# Patient Record
Sex: Female | Born: 1935
Health system: Southern US, Community
[De-identification: ages and names within clinical notes are randomized; demographics above are authoritative.]

## PROBLEM LIST (undated history)

## (undated) DIAGNOSIS — H3530 Unspecified macular degeneration: Secondary | ICD-10-CM

## (undated) DIAGNOSIS — M109 Gout, unspecified: Secondary | ICD-10-CM

## (undated) DIAGNOSIS — N3941 Urge incontinence: Secondary | ICD-10-CM

## (undated) DIAGNOSIS — R0789 Other chest pain: Secondary | ICD-10-CM

## (undated) DIAGNOSIS — I10 Essential (primary) hypertension: Secondary | ICD-10-CM

## (undated) DIAGNOSIS — F17200 Nicotine dependence, unspecified, uncomplicated: Secondary | ICD-10-CM

## (undated) DIAGNOSIS — N183 Chronic kidney disease, stage 3 unspecified: Secondary | ICD-10-CM

## (undated) DIAGNOSIS — I495 Sick sinus syndrome: Secondary | ICD-10-CM

## (undated) DIAGNOSIS — E781 Pure hyperglyceridemia: Secondary | ICD-10-CM

## (undated) DIAGNOSIS — Z950 Presence of cardiac pacemaker: Secondary | ICD-10-CM

## (undated) DIAGNOSIS — K5792 Diverticulitis of intestine, part unspecified, without perforation or abscess without bleeding: Secondary | ICD-10-CM

## (undated) HISTORY — DX: Gout, unspecified: M10.9

## (undated) HISTORY — DX: Nicotine dependence, unspecified, uncomplicated: F17.200

## (undated) HISTORY — DX: Pure hyperglyceridemia: E78.1

## (undated) HISTORY — DX: Unspecified macular degeneration: H35.30

## (undated) HISTORY — DX: Diverticulitis of intestine, part unspecified, without perforation or abscess without bleeding: K57.92

## (undated) HISTORY — DX: Presence of cardiac pacemaker: Z95.0

## (undated) HISTORY — DX: Chronic kidney disease, stage 3 unspecified: N18.30

## (undated) HISTORY — PX: PACEMAKER INSERTION: SHX728

## (undated) HISTORY — DX: Chronic kidney disease, stage 3 (moderate): N18.3

## (undated) HISTORY — DX: Other chest pain: R07.89

## (undated) HISTORY — DX: Urge incontinence: N39.41

## (undated) HISTORY — DX: Sick sinus syndrome: I49.5

## (undated) HISTORY — DX: Essential (primary) hypertension: I10

---
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
## 1958-05-14 HISTORY — PX: TUBAL LIGATION: SHX77

## 1976-05-14 HISTORY — PX: ABDOMINAL HYSTERECTOMY: SHX81

## 1992-05-14 DIAGNOSIS — Z950 Presence of cardiac pacemaker: Secondary | ICD-10-CM

## 1992-05-14 HISTORY — PX: PACEMAKER INSERTION: SHX728

## 1992-05-14 HISTORY — DX: Presence of cardiac pacemaker: Z95.0

## 1996-05-14 HISTORY — PX: CHOLECYSTECTOMY: SHX55

## 1997-09-08 ENCOUNTER — Ambulatory Visit (HOSPITAL_COMMUNITY): Admission: RE | Admit: 1997-09-08 | Discharge: 1997-09-08 | Payer: Self-pay | Admitting: Internal Medicine

## 1998-05-14 HISTORY — PX: CARDIAC CATHETERIZATION: SHX172

## 1998-09-12 ENCOUNTER — Ambulatory Visit (HOSPITAL_COMMUNITY): Admission: RE | Admit: 1998-09-12 | Discharge: 1998-09-12 | Payer: Self-pay | Admitting: Internal Medicine

## 1998-09-12 ENCOUNTER — Encounter: Payer: Self-pay | Admitting: Internal Medicine

## 1998-11-11 ENCOUNTER — Other Ambulatory Visit: Admission: RE | Admit: 1998-11-11 | Discharge: 1998-11-11 | Payer: Self-pay | Admitting: Obstetrics and Gynecology

## 1998-12-30 ENCOUNTER — Ambulatory Visit (HOSPITAL_COMMUNITY): Admission: RE | Admit: 1998-12-30 | Discharge: 1998-12-30 | Payer: Self-pay | Admitting: Cardiology

## 1999-02-14 ENCOUNTER — Encounter: Payer: Self-pay | Admitting: Cardiovascular Disease

## 1999-02-15 ENCOUNTER — Inpatient Hospital Stay (HOSPITAL_COMMUNITY): Admission: RE | Admit: 1999-02-15 | Discharge: 1999-02-16 | Payer: Self-pay | Admitting: Cardiovascular Disease

## 1999-02-15 ENCOUNTER — Encounter: Payer: Self-pay | Admitting: Cardiovascular Disease

## 1999-03-18 ENCOUNTER — Ambulatory Visit (HOSPITAL_COMMUNITY): Admission: RE | Admit: 1999-03-18 | Discharge: 1999-03-18 | Payer: Self-pay | Admitting: Critical Care Medicine

## 1999-05-15 LAB — HM PAP SMEAR: HM Pap smear: NORMAL

## 2000-05-14 HISTORY — PX: CYSTOSCOPY: SHX5120

## 2000-06-11 ENCOUNTER — Other Ambulatory Visit: Admission: RE | Admit: 2000-06-11 | Discharge: 2000-06-11 | Payer: Self-pay | Admitting: Internal Medicine

## 2000-06-12 ENCOUNTER — Encounter: Payer: Self-pay | Admitting: Internal Medicine

## 2000-06-12 ENCOUNTER — Ambulatory Visit (HOSPITAL_COMMUNITY): Admission: RE | Admit: 2000-06-12 | Discharge: 2000-06-12 | Payer: Self-pay | Admitting: Internal Medicine

## 2001-05-14 DIAGNOSIS — R0789 Other chest pain: Secondary | ICD-10-CM

## 2001-05-14 HISTORY — DX: Other chest pain: R07.89

## 2001-06-23 ENCOUNTER — Ambulatory Visit (HOSPITAL_COMMUNITY): Admission: RE | Admit: 2001-06-23 | Discharge: 2001-06-23 | Payer: Self-pay | Admitting: Internal Medicine

## 2001-06-23 ENCOUNTER — Encounter: Payer: Self-pay | Admitting: Internal Medicine

## 2001-08-15 ENCOUNTER — Encounter: Admission: RE | Admit: 2001-08-15 | Discharge: 2001-08-15 | Payer: Self-pay | Admitting: Internal Medicine

## 2001-08-15 ENCOUNTER — Encounter: Payer: Self-pay | Admitting: Internal Medicine

## 2001-11-21 ENCOUNTER — Other Ambulatory Visit: Admission: RE | Admit: 2001-11-21 | Discharge: 2001-11-21 | Payer: Self-pay | Admitting: Internal Medicine

## 2002-08-20 ENCOUNTER — Encounter: Payer: Self-pay | Admitting: Internal Medicine

## 2002-08-20 ENCOUNTER — Ambulatory Visit (HOSPITAL_COMMUNITY): Admission: RE | Admit: 2002-08-20 | Discharge: 2002-08-20 | Payer: Self-pay | Admitting: Internal Medicine

## 2003-08-23 ENCOUNTER — Ambulatory Visit (HOSPITAL_COMMUNITY): Admission: RE | Admit: 2003-08-23 | Discharge: 2003-08-23 | Payer: Self-pay | Admitting: Internal Medicine

## 2004-08-23 ENCOUNTER — Ambulatory Visit (HOSPITAL_COMMUNITY): Admission: RE | Admit: 2004-08-23 | Discharge: 2004-08-23 | Payer: Self-pay | Admitting: Internal Medicine

## 2004-11-11 HISTORY — PX: EYE SURGERY: SHX253

## 2005-08-27 ENCOUNTER — Ambulatory Visit (HOSPITAL_COMMUNITY): Admission: RE | Admit: 2005-08-27 | Discharge: 2005-08-27 | Payer: Self-pay | Admitting: Internal Medicine

## 2006-02-19 ENCOUNTER — Encounter: Admission: RE | Admit: 2006-02-19 | Discharge: 2006-02-19 | Payer: Self-pay | Admitting: *Deleted

## 2006-02-25 ENCOUNTER — Ambulatory Visit (HOSPITAL_COMMUNITY): Admission: RE | Admit: 2006-02-25 | Discharge: 2006-02-25 | Payer: Self-pay | Admitting: *Deleted

## 2006-08-30 ENCOUNTER — Ambulatory Visit (HOSPITAL_COMMUNITY): Admission: RE | Admit: 2006-08-30 | Discharge: 2006-08-30 | Payer: Self-pay | Admitting: *Deleted

## 2007-09-02 ENCOUNTER — Ambulatory Visit (HOSPITAL_COMMUNITY): Admission: RE | Admit: 2007-09-02 | Discharge: 2007-09-02 | Payer: Self-pay | Admitting: *Deleted

## 2007-10-23 ENCOUNTER — Encounter: Admission: RE | Admit: 2007-10-23 | Discharge: 2007-10-23 | Payer: Self-pay | Admitting: Family Medicine

## 2008-01-15 ENCOUNTER — Encounter: Admission: RE | Admit: 2008-01-15 | Discharge: 2008-01-15 | Payer: Self-pay | Admitting: Gastroenterology

## 2008-09-02 ENCOUNTER — Ambulatory Visit (HOSPITAL_COMMUNITY): Admission: RE | Admit: 2008-09-02 | Discharge: 2008-09-02 | Payer: Self-pay | Admitting: Family Medicine

## 2009-05-14 LAB — HM PAP SMEAR: HM Pap smear: NORMAL

## 2009-09-05 ENCOUNTER — Ambulatory Visit (HOSPITAL_COMMUNITY): Admission: RE | Admit: 2009-09-05 | Discharge: 2009-09-05 | Payer: Self-pay | Admitting: Family Medicine

## 2009-10-19 ENCOUNTER — Encounter: Admission: RE | Admit: 2009-10-19 | Discharge: 2009-10-19 | Payer: Self-pay | Admitting: Surgery

## 2009-11-11 HISTORY — PX: COLON SURGERY: SHX602

## 2009-11-24 ENCOUNTER — Inpatient Hospital Stay (HOSPITAL_COMMUNITY): Admission: RE | Admit: 2009-11-24 | Discharge: 2009-12-02 | Payer: Self-pay | Admitting: Surgery

## 2009-11-24 ENCOUNTER — Encounter (INDEPENDENT_AMBULATORY_CARE_PROVIDER_SITE_OTHER): Payer: Self-pay | Admitting: Surgery

## 2009-12-12 ENCOUNTER — Inpatient Hospital Stay (HOSPITAL_COMMUNITY): Admission: AD | Admit: 2009-12-12 | Discharge: 2009-12-18 | Payer: Self-pay | Admitting: Surgery

## 2010-06-04 ENCOUNTER — Encounter: Payer: Self-pay | Admitting: Family Medicine

## 2010-07-28 LAB — BASIC METABOLIC PANEL
BUN: 35 mg/dL — ABNORMAL HIGH (ref 6–23)
CO2: 23 mEq/L (ref 19–32)
CO2: 24 mEq/L (ref 19–32)
Calcium: 9 mg/dL (ref 8.4–10.5)
Calcium: 9.4 mg/dL (ref 8.4–10.5)
Creatinine, Ser: 0.95 mg/dL (ref 0.4–1.2)
Creatinine, Ser: 1.72 mg/dL — ABNORMAL HIGH (ref 0.4–1.2)
GFR calc Af Amer: >60 mL/min (ref >60)
GFR calc Af Amer: >60 mL/min (ref >60)
GFR calc non Af Amer: 29 mL/min — ABNORMAL LOW (ref >60)
Glucose, Bld: 122 mg/dL — ABNORMAL HIGH (ref 70–99)
Glucose, Bld: 149 mg/dL — ABNORMAL HIGH (ref 70–99)
Potassium: 4.4 mEq/L (ref 3.5–5.1)
Potassium: 4.5 mEq/L (ref 3.5–5.1)
Sodium: 139 mEq/L (ref 135–145)

## 2010-07-28 LAB — URINALYSIS, ROUTINE W REFLEX MICROSCOPIC
Glucose, UA: NEGATIVE mg/dL
Ketones, ur: NEGATIVE mg/dL
Nitrite: NEGATIVE
Protein, ur: NEGATIVE mg/dL

## 2010-07-28 LAB — CBC
HCT: 31.6 % — ABNORMAL LOW (ref 36.0–46.0)
MCHC: 33.6 g/dL (ref 30.0–36.0)
MCV: 93.9 fL (ref 78.0–100.0)
Platelets: 330 10*3/uL (ref 150–400)
RBC: 3.44 MIL/uL — ABNORMAL LOW (ref 3.87–5.11)
RDW: 13.7 % (ref 11.5–15.5)
RDW: 13.9 % (ref 11.5–15.5)
WBC: 13.2 10*3/uL — ABNORMAL HIGH (ref 4.0–10.5)

## 2010-07-28 LAB — URINE MICROSCOPIC-ADD ON

## 2010-07-28 LAB — STOOL CULTURE

## 2010-07-28 LAB — CLOSTRIDIUM DIFFICILE EIA

## 2010-07-29 LAB — BASIC METABOLIC PANEL
CO2: 26 mEq/L (ref 19–32)
Calcium: 8.6 mg/dL (ref 8.4–10.5)
Calcium: 8.6 mg/dL (ref 8.4–10.5)
Chloride: 105 mEq/L (ref 96–112)
GFR calc Af Amer: >60 mL/min (ref >60)
GFR calc Af Amer: >60 mL/min (ref >60)
GFR calc non Af Amer: 41 mL/min — ABNORMAL LOW (ref >60)
GFR calc non Af Amer: >60 mL/min (ref >60)
Glucose, Bld: 108 mg/dL — ABNORMAL HIGH (ref 70–99)
Glucose, Bld: 138 mg/dL — ABNORMAL HIGH (ref 70–99)
Potassium: 4.1 mEq/L (ref 3.5–5.1)
Potassium: 4.3 mEq/L (ref 3.5–5.1)
Potassium: 4.8 mEq/L (ref 3.5–5.1)
Sodium: 137 mEq/L (ref 135–145)
Sodium: 138 mEq/L (ref 135–145)
Sodium: 141 mEq/L (ref 135–145)

## 2010-07-29 LAB — CBC
HCT: 30.9 % — ABNORMAL LOW (ref 36.0–46.0)
HCT: 33.2 % — ABNORMAL LOW (ref 36.0–46.0)
HCT: 34.5 % — ABNORMAL LOW (ref 36.0–46.0)
Hemoglobin: 10.4 g/dL — ABNORMAL LOW (ref 12.0–15.0)
Hemoglobin: 11.3 g/dL — ABNORMAL LOW (ref 12.0–15.0)
Hemoglobin: 11.7 g/dL — ABNORMAL LOW (ref 12.0–15.0)
MCH: 31.6 pg (ref 26.0–34.0)
MCHC: 33.8 g/dL (ref 30.0–36.0)
MCV: 93.1 fL (ref 78.0–100.0)
RBC: 3.27 MIL/uL — ABNORMAL LOW (ref 3.87–5.11)
RBC: 3.56 MIL/uL — ABNORMAL LOW (ref 3.87–5.11)
RDW: 13.9 % (ref 11.5–15.5)
WBC: 16.1 10*3/uL — ABNORMAL HIGH (ref 4.0–10.5)

## 2010-07-30 LAB — URINALYSIS, ROUTINE W REFLEX MICROSCOPIC
Bilirubin Urine: NEGATIVE
Glucose, UA: NEGATIVE mg/dL
Ketones, ur: NEGATIVE mg/dL
pH: 6.5 (ref 5.0–8.0)

## 2010-07-30 LAB — CROSSMATCH
ABO/RH(D): A POS
Antibody Screen: NEGATIVE

## 2010-07-30 LAB — CBC
Hemoglobin: 13.2 g/dL (ref 12.0–15.0)
MCHC: 33.7 g/dL (ref 30.0–36.0)
WBC: 10.2 10*3/uL (ref 4.0–10.5)

## 2010-07-30 LAB — ABO/RH: ABO/RH(D): A POS

## 2010-07-30 LAB — URINE MICROSCOPIC-ADD ON

## 2010-07-30 LAB — COMPREHENSIVE METABOLIC PANEL
ALT: <8 U/L (ref 0–35)
AST: 21 U/L (ref 0–37)
CO2: 26 mEq/L (ref 19–32)
Calcium: 9.9 mg/dL (ref 8.4–10.5)
GFR calc Af Amer: 50 mL/min — ABNORMAL LOW (ref >60)
Sodium: 141 mEq/L (ref 135–145)
Total Protein: 7.3 g/dL (ref 6.0–8.3)

## 2010-07-30 LAB — DIFFERENTIAL
Eosinophils Absolute: 0.2 10*3/uL (ref 0.0–0.7)
Eosinophils Relative: 2 % (ref 0–5)
Lymphs Abs: 2.9 10*3/uL (ref 0.7–4.0)
Monocytes Relative: 7 % (ref 3–12)

## 2010-08-09 ENCOUNTER — Ambulatory Visit
Admission: RE | Admit: 2010-08-09 | Discharge: 2010-08-09 | Disposition: A | Discharge status: Home / Self Care | Payer: Medicare Other | Source: Ambulatory Visit | Attending: Family Medicine | Admitting: Family Medicine

## 2010-08-09 ENCOUNTER — Other Ambulatory Visit: Payer: Self-pay | Admitting: Family Medicine

## 2010-08-09 DIAGNOSIS — R1032 Left lower quadrant pain: Secondary | ICD-10-CM

## 2010-08-09 MED ORDER — IOHEXOL 300 MG/ML  SOLN
100.0000 mL | Freq: Once | INTRAMUSCULAR | Status: AC | PRN
  Administered 2010-08-09: 100 mL via INTRAVENOUS

## 2010-08-21 ENCOUNTER — Other Ambulatory Visit (HOSPITAL_COMMUNITY): Payer: Self-pay | Admitting: Family Medicine

## 2010-08-21 DIAGNOSIS — Z1231 Encounter for screening mammogram for malignant neoplasm of breast: Secondary | ICD-10-CM

## 2010-09-12 ENCOUNTER — Ambulatory Visit (HOSPITAL_COMMUNITY)
Admission: RE | Admit: 2010-09-12 | Discharge: 2010-09-12 | Disposition: A | Discharge status: Home / Self Care | Payer: Medicare Other | Source: Ambulatory Visit | Attending: Family Medicine | Admitting: Family Medicine

## 2010-09-12 DIAGNOSIS — Z1231 Encounter for screening mammogram for malignant neoplasm of breast: Secondary | ICD-10-CM

## 2010-09-29 NOTE — Op Note (Signed)
[name], [name] NO.:  [id number]   MEDICAL RECORD NO.:  [id number]          PATIENT TYPE:  OIB   LOCATION:  2899                         FACILITY:  [facility]   PHYSICIAN:  Darlin Priestly, MD  DATE OF BIRTH:  1927-05-26   DATE OF PROCEDURE:  02/25/2006  DATE OF DISCHARGE:                                 OPERATIVE REPORT   PROCEDURES:  1. Insertion of temporary transvenous pacing wire via right femoral      sheath.  2. Explant of a Guidant Discovery II, model [id number] generator, serial      [id number], initially implanted 02/14/1999.  3. Atrial and ventricular lead interrogation.  4. Pocket modification.  5. Implant of a Medtronic Adapta ADDRL1, serial [id number].   ATTENDING SURGEON:  Darlin Priestly, MD.   COMPLICATIONS:  None.   INDICATIONS:  [name] is a 77 year old female patient of Dr. Madison Hickman, and Dr. Susa Griffins with a history of complete heart block,  status post initial pacer implant in 1994 by Dr. Mila Atkinson, changed out in  2000 by Dr. Alanda Amass, as she reached ERI.  She has recently tripped the ERI  on her 2000 generator.  She is now referred for generator change-out.   DESCRIPTION OF OPERATIVE PROCEDURE:  After the patient gave informed written  consent, the patient was brought to the cardiac cath lab, where the right  groin was shaved, prepped and draped and the right chest was prepped and  draped in sterile fashion.  Lidocaine 1% was then used to anesthetize the  right groin.  Next a #6-French venous sheath was inserted in the right  femoral vein.  Next, under fluoroscopic guidance, a temporary transvenous  pacing wire was then floated into the RV apex and thresholds were  determined.  This was secured to the sheath.  Attention was then turned to  the right chest.  Lidocaine 1% was then anesthetized over the previously  implanted generator.  Next, roughly a 3-cm incision was carried out just  inferior to her previous  implant scar, and hemostasis was obtained with  electrocautery.  Blunt dissection was used to carry this down to the pacer  capsule.  The capsule was then opened with a scalpel.  The generator was  then freed from the capsule and delivered from the pocket.  As previously  stated, this was a [employer] II, model [id number], serial [id number].  The  leads were then inspected.  The atrial lead was a model 433-03, serial  [id number], initially implanted on 12/26/92.  This lead appeared to be in  excellent condition.  The ventricular lead was model 431-07, serial  [id number], implanted 12/26/92.  The leads were then interrogated.  P waves  were measured at 0.9 mV.  The atrial lead impedance was 389 ohms.  Threshold  in the atrium was 0.7 V at 0.5 msec.  Current was 1.9 mA.  R waves were  measured at 2.8 mV.  The impedance was 350 ohms.  Threshold in the ventricle  was 1.1 V at 0.5 msec.  Current was 2.5 mA.  We  did entertain replacing the  unipolar ventricular lead; however, she had internodal AV conduction with  excellent thresholds in the atrium, and we ultimately opted for implant of  an Adapta generator with MVP and surge AV hysteresis to allow internodal  conduction.  We were able to program the generator down to 1 mV for sensing.  The leads were connected in serial fashion to the header, and the head  screws were tightened.  The pocket was then copiously irrigated with 1%  kanamycin solution.  Hemostasis was again confirmed.  The generator and  leads were then delivered into the pocket.  The subcutaneous layer was then  closed with running 2-0 Vicryl.  The skin was closed with running 4-0  Vicryl.  Steri-Strips were then applied.  The right femoral transvenous  pacer was then removed, and the femoral sheath was discontinued.  Pressure  was held.  The patient returned to the recovery room in stable condition.   CONCLUSIONS:  1. Successful implant of a temporary transvenous pacing wire via the  right      femoral vein.  2. Explant of a Guidant Discovery II, model [id number], serial [id number],      initially implanted 02/14/1999.  3. Atrial and ventricular lead interrogation.  4. Pocket modification.  5. Implant of a Medtronic Adapta ADDRL1, serial [id number].      Darlin Priestly, MD  Electronically Signed     RHM/MEDQ  D:  02/25/2006  T:  02/25/2006  Job:  [id number]   cc:   Darius Bump, M.D.  Richard A. Alanda Amass, M.D.

## 2010-09-29 NOTE — H&P (Signed)
NAMELISSETT, FAVORITE NO.:  [id number]   MEDICAL RECORD NO.:  [id number]          PATIENT TYPE:  OIB   LOCATION:  2899                         FACILITY:  [facility]   PHYSICIAN:  Darlin Priestly, MD  DATE OF BIRTH:  1928-02-25   DATE OF ADMISSION:  02/25/2006  DATE OF DISCHARGE:  02/25/2006                              HISTORY & PHYSICAL   CHIEF COMPLAINT:  Tripped ERI on permanent pacemaker.   HISTORY OF PRESENT ILLNESS:  A 77 year old white patient, female patient  of Dr. Madison Hickman, with a history of complete heart block with a  generator implant in 1994 by Dr. Edwyna Shell, changed out in 2000 with Dr.  Alanda Amass, as she reached end of life.  She was seen by Dr. Jenne Campus  02/19/06, and again, generator with ERI.  She had a [employer] II  model number [id number], serial number [id number] implanted February 14, 1999.  She  is dependent but has ventricular escape rhythm in the upper 20s.  Please  see Dr. Mikey Bussing note of 02/19/06.  She was scheduled to come in as an  outpatient for explantation of old device and implantation of new.   PAST MEDICAL HISTORY:  See above.  Negative Myoview in '05.   FAMILY HISTORY/SOCIAL HISTORY/REVIEW OF SYSTEMS:  Without change, please  see her records from the office and copies were sent for this procedure.   VITAL SIGNS ON ADMISSION:  Blood pressure 135/75, pulse 72, respiratory  rate 16, temperature 97.4, oxygen saturation 99%.   EXAM:  GENERAL:  Alert and oriented white female.  LUNGS:  Clear.  HEART:  S1, S2, regular rate and rhythm.  ABDOMEN:  Soft and nontender.  Positive bowel sounds.  EXTREMITIES:  Lower extremities without edema.   ASSESSMENT:  End of life, on Guidant pacemaker, here for explantation of  old device and implantation of new device and to evaluate her leads.   PLAN:  As above.      Darcella Gasman. Valarie Merino      Darlin Priestly, MD  Electronically Signed   LRI/MEDQ  D:  03/27/2006  T:  03/28/2006   Job:  [id number]

## 2011-08-17 ENCOUNTER — Other Ambulatory Visit: Payer: Self-pay | Admitting: Family Medicine

## 2011-08-17 DIAGNOSIS — Z1231 Encounter for screening mammogram for malignant neoplasm of breast: Secondary | ICD-10-CM

## 2011-09-06 ENCOUNTER — Encounter: Payer: Self-pay | Admitting: *Deleted

## 2011-09-13 ENCOUNTER — Ambulatory Visit (HOSPITAL_COMMUNITY): Payer: Medicare Other

## 2011-09-14 ENCOUNTER — Ambulatory Visit (HOSPITAL_COMMUNITY)
Admission: RE | Admit: 2011-09-14 | Discharge: 2011-09-14 | Disposition: A | Discharge status: Home / Self Care | Payer: Medicare Other | Source: Ambulatory Visit | Attending: Family Medicine | Admitting: Family Medicine

## 2011-09-14 DIAGNOSIS — Z1231 Encounter for screening mammogram for malignant neoplasm of breast: Secondary | ICD-10-CM | POA: Insufficient documentation

## 2011-09-20 ENCOUNTER — Encounter: Payer: Self-pay | Admitting: *Deleted

## 2011-10-22 ENCOUNTER — Ambulatory Visit (INDEPENDENT_AMBULATORY_CARE_PROVIDER_SITE_OTHER): Payer: Medicare Other | Admitting: Family Medicine

## 2011-10-22 ENCOUNTER — Encounter: Payer: Self-pay | Admitting: Family Medicine

## 2011-10-22 VITALS — BP 118/76 | HR 64 | Ht 65.0 in | Wt 168.0 lb

## 2011-10-22 DIAGNOSIS — F17200 Nicotine dependence, unspecified, uncomplicated: Secondary | ICD-10-CM

## 2011-10-22 DIAGNOSIS — Z79899 Other long term (current) drug therapy: Secondary | ICD-10-CM

## 2011-10-22 DIAGNOSIS — E781 Pure hyperglyceridemia: Secondary | ICD-10-CM | POA: Insufficient documentation

## 2011-10-22 DIAGNOSIS — N289 Disorder of kidney and ureter, unspecified: Secondary | ICD-10-CM

## 2011-10-22 DIAGNOSIS — Z720 Tobacco use: Secondary | ICD-10-CM | POA: Insufficient documentation

## 2011-10-22 DIAGNOSIS — R7989 Other specified abnormal findings of blood chemistry: Secondary | ICD-10-CM

## 2011-10-22 DIAGNOSIS — I10 Essential (primary) hypertension: Secondary | ICD-10-CM

## 2011-10-22 DIAGNOSIS — N183 Chronic kidney disease, stage 3 unspecified: Secondary | ICD-10-CM | POA: Insufficient documentation

## 2011-10-22 DIAGNOSIS — R799 Abnormal finding of blood chemistry, unspecified: Secondary | ICD-10-CM

## 2011-10-22 LAB — POCT URINALYSIS DIPSTICK
Bilirubin, UA: NEGATIVE
Glucose, UA: NEGATIVE
Spec Grav, UA: 1.01
Urobilinogen, UA: NEGATIVE

## 2011-10-22 LAB — CBC WITH DIFFERENTIAL/PLATELET
HCT: 41.3 % (ref 36.0–46.0)
Hemoglobin: 13.9 g/dL (ref 12.0–15.0)
Lymphocytes Relative: 29 % (ref 12–46)
Monocytes Absolute: 0.6 10*3/uL (ref 0.1–1.0)
Monocytes Relative: 6 % (ref 3–12)
Neutro Abs: 6.6 10*3/uL (ref 1.7–7.7)
WBC: 10.3 10*3/uL (ref 4.0–10.5)

## 2011-10-22 LAB — TSH: TSH: 1.494 u[IU]/mL (ref 0.350–4.500)

## 2011-10-22 NOTE — Progress Notes (Signed)
Chief Complaint  Patient presents with  . Advice Only    former patient requesting kidney evaluation and blood work. Last seen by Claude Manges.  UA showed 1+ blood and 1+ leuks-pt asymptomatic.   HPI:  Patient presents to re-establish care, asking for bloodwork, not feeling confident in what she was told by previous provider. She is concerned about her kidney function.  Review of old chart shows Cr 1.27, GFR 40 in 07/2011.  Cr as high as 1.49 in recent years. She denies any urinary complaints, except occasional slight leakage when very active.  She reports being told she has gout R hand (has intermittent pain/swelling in Right 2nd MCP if she overuses it--she thinks it is arthritis).  She was rx'd uloric, but it was much too expensive so didn't take it.  Doesn't have pain daily. Per previous records, last uric acid level was 6.8 (not on meds)  She has a history of diverticulitis and diverticulosis.  She hasn't had recent problems.  She reports occasional burning LLQ discomfort, depending on what she eats. Denies pain, vomiting, or blood in her stool.  Past Medical History  Diagnosis Date  . Sick sinus syndrome with bradycardia  . Pacemaker     sees Dr. Rubie Maid [facility]) annually. Pacemaker replaced 2007 Jenne Campus)  . Urge incontinence   . Hypertension   . Hypertriglyceridemia   . Chest wall pain 2003    atypical, now resolved  . Diverticulitis     with DT flare in 6/09  . Macular degeneration     Dr. Sharlot Gowda  . CKD (chronic kidney disease), stage III     Cr 1.27, GFR 40 (07/2011)  . Tobacco use disorder     Past Surgical History  Procedure Date  . Pacemaker insertion 1994, 2000, 2007  . Cholecystectomy 1998    Dr. Gerrit Friends  . Appendectomy 1952  . Tubal ligation 1960  . Abdominal hysterectomy 1978    partial  . Eye surgery 11/2004    cataract surgery b/l - Dr.Bergen  . Colon surgery 11/2009    vaginal-colon fistula repair (Drs. Delane Ginger)  . Cardiac catheterization 2000  .  Cystoscopy 2002    Dr. Wanda Plump    History   Social History  . Marital Status: Divorced    Spouse Name: N/A    Number of Children: 4  . Years of Education: N/A   Occupational History  . retired Agricultural engineer    Social History Main Topics  . Smoking status: Current Everyday Smoker -- 0.2 packs/day for 40 years  . Smokeless tobacco: Not on file   Comment: smokes 1 pack/week  . Alcohol Use: No  . Drug Use: No  . Sexually Active: Not on file   Other Topics Concern  . Not on file   Social History Narrative   Lives alone, 1 cat.  [place], Gavin Pound Banker at [employer]) lives in [place].    Family History  Problem Relation Age of Onset  . Diabetes Mother   . Heart disease Mother   . Hyperlipidemia Mother   . Hypertension Mother   . Heart disease Father     pacemaker, CABG  . Hyperlipidemia Father   . Hypertension Father   . Cancer Sister     breast cancer 18's, then recurrence, metastatic, died at 30  . Diabetes Brother   . Alcohol abuse Brother   . Heart disease Brother   . Crohn's disease Daughter   . Alzheimer's disease Brother 54  Current outpatient prescriptions:aspirin 81 MG tablet, Take 81 mg by mouth daily., Disp: , Rfl: ;  bumetanide (BUMEX) 1 MG tablet, Take 1 mg by mouth daily., Disp: , Rfl: ;  calcium carbonate (OS-CAL) 600 MG TABS, Take 600 mg by mouth daily., Disp: , Rfl: ;  fenofibrate 54 MG tablet, Take 54 mg by mouth daily., Disp: , Rfl: ;  metoprolol tartrate (LOPRESSOR) 25 MG tablet, Take 75 mg by mouth daily. 50mg  q am and 25mg  q evening, Disp: , Rfl:  Multiple Vitamins-Minerals (MULTIVITAMIN WITH MINERALS) tablet, Take 1 tablet by mouth daily., Disp: , Rfl: ;  Multiple Vitamins-Minerals (OCUVITE PRESERVISION PO), Take 1 tablet by mouth daily., Disp: , Rfl: ;  polycarbophil (FIBERCON) 625 MG tablet, Take 1,875 mg by mouth daily., Disp: , Rfl:   Allergies  Allergen Reactions  . Morphine And Related Nausea And Vomiting   ROS:  Denies  fevers, headaches, dizziness, URI symptoms, cough, shortness of breath, chest pain, edema, GI complaints. Denies claudication.  Mild stress urinary incontinence.  Hand (2nd MCP) pain intermittently.  Denies skin rashes, depression or other concerns.  PHYSICAL EXAM: BP 118/76  Pulse 64  Ht 5\' 5"  (1.651 m)  Wt 168 lb (76.204 kg)  BMI 27.96 kg/m2 Well developed, pleasant female in no distress HEENT:  PERRL, EOMI, conjunctiva clear.  OP clear. Neck: no lymphadenopathy, thyromegaly or bruit or mass Heart: regular rate and rhythm Lungs: clear bilaterally, somewhat diminished breath sounds throughout. No wheezes, rales, ronchi Abdomen: soft, nontender, active bowel sounds, no mass Weakened pulses bilaterally, cool feet with brisk capillary refill. Mild bony enlargement of index finger MCP--no warmth, effusion or tenderness Skin: no rash Psych: normal mood, affect, hygiene and grooming  ASSESSMENT/PLAN: 1. Elevated serum creatinine  POCT Urinalysis Dipstick  2. Tobacco use disorder    3. Renal insufficiency  CBC with Differential, Vitamin D 25 hydroxy, TSH, Comprehensive metabolic panel  4. Essential hypertension, benign  Comprehensive metabolic panel  5. Pure hyperglyceridemia  Lipid panel  6. Encounter for long-term (current) use of other medications  CBC with Differential, Vitamin D 25 hydroxy, TSH, Comprehensive metabolic panel, Lipid panel   Most likely arthritis, rather than gout, in her hand.  Doubt gout. Mild renal insufficiency--advised to avoid NSAIDs, use tylenol prn pain. HTN--well controlled  Counseled extensively regarding tobacco cessation, available help, risks of ongoing smoking.  c-met, urine acid, lipid, CBC, TSH, Vitamin D

## 2011-10-22 NOTE — Patient Instructions (Signed)
Please stop smoking!  Remember the smoking cessation class at [facility], 1800-QUIT-NOW and/or [employer]ca to help you quit.  Avoid anti-inflammatories.  Use tylenol for most aches and pains, rather than advil/ibuprofen/aleve.

## 2011-10-23 ENCOUNTER — Encounter: Payer: Self-pay | Admitting: Family Medicine

## 2011-10-23 LAB — COMPREHENSIVE METABOLIC PANEL
ALT: <8 U/L (ref 0–35)
CO2: 27 mEq/L (ref 19–32)
Calcium: 9.9 mg/dL (ref 8.4–10.5)
Chloride: 104 mEq/L (ref 96–112)
Potassium: 4.4 mEq/L (ref 3.5–5.3)
Sodium: 141 mEq/L (ref 135–145)
Total Protein: 6.7 g/dL (ref 6.0–8.3)

## 2011-10-23 LAB — LIPID PANEL
Cholesterol: 155 mg/dL (ref 0–200)
VLDL: 27 mg/dL (ref 0–40)

## 2012-02-18 IMAGING — CR DG CHEST 2V
2 series · 2 of 2 positions shown · non-contrast
Comparison: 02/19/2006

CLINICAL DATA: Preoperative evaluation.  Diverticular disease.

CHEST - 2 VIEW

[view not recorded (1 of 2)]
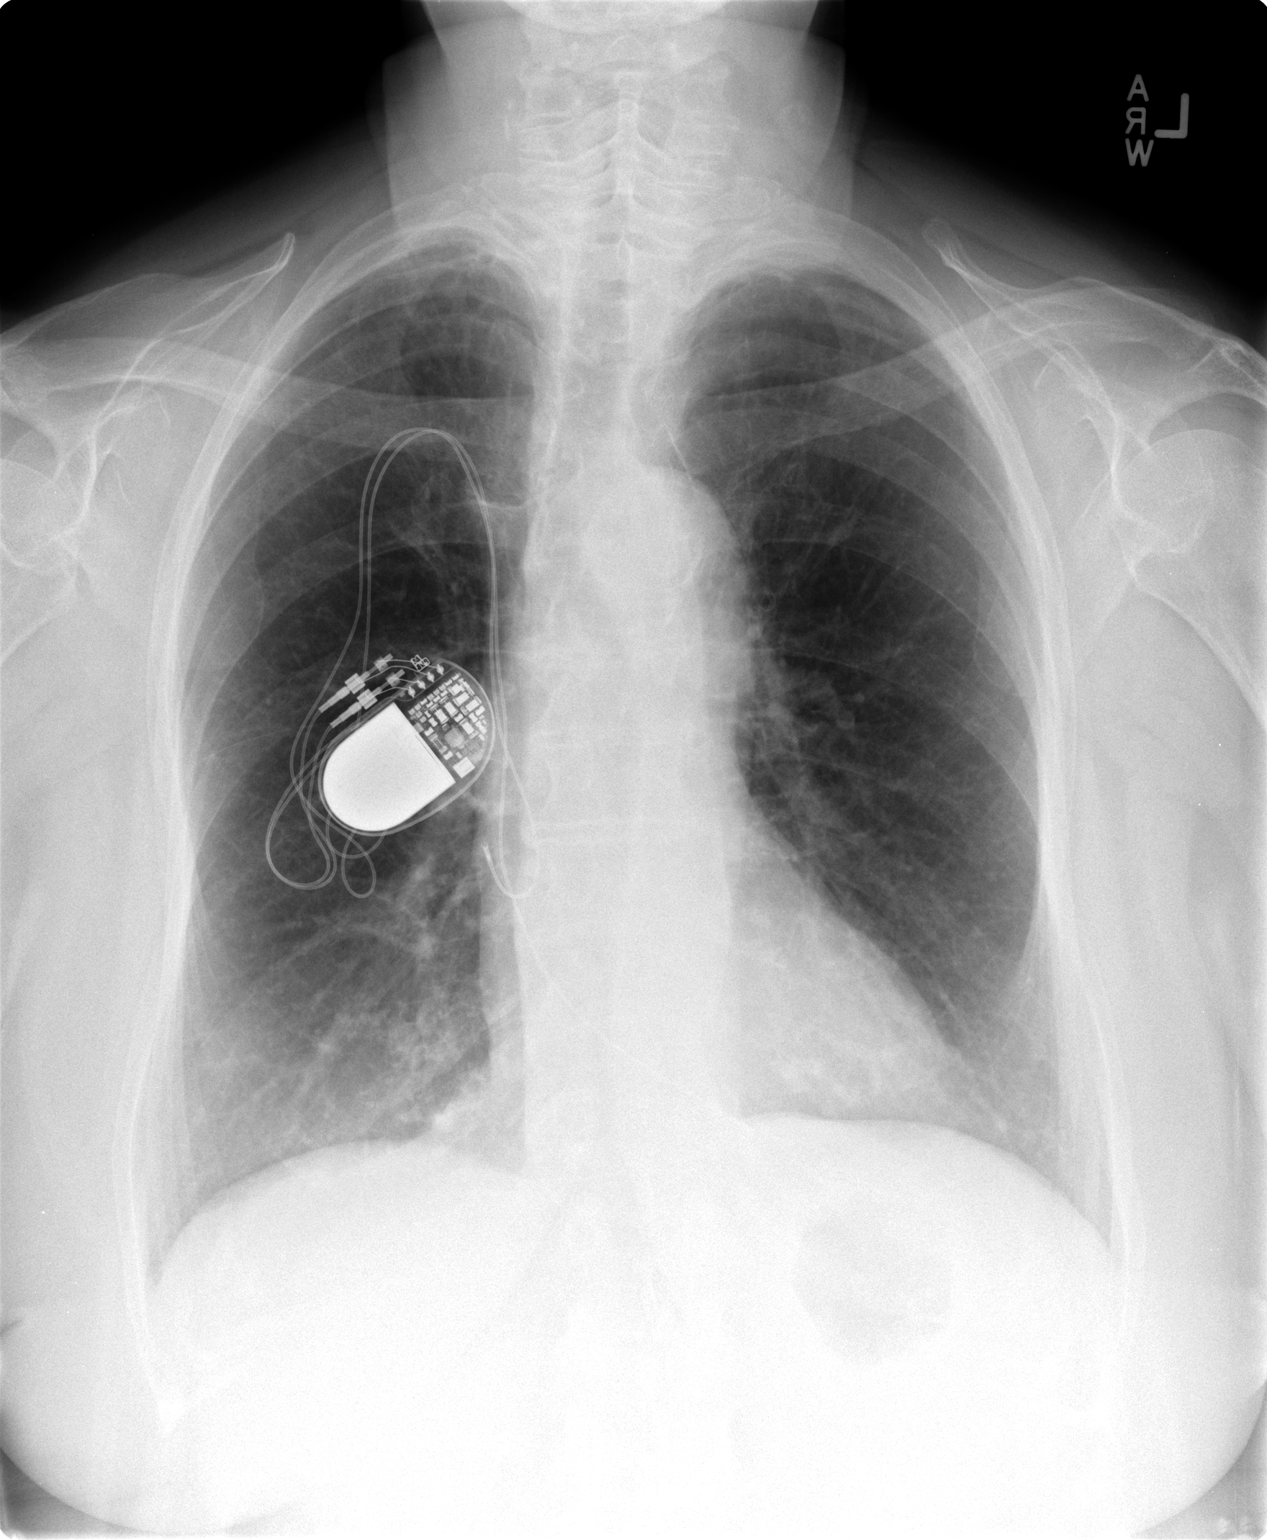

[view not recorded (2 of 2)]
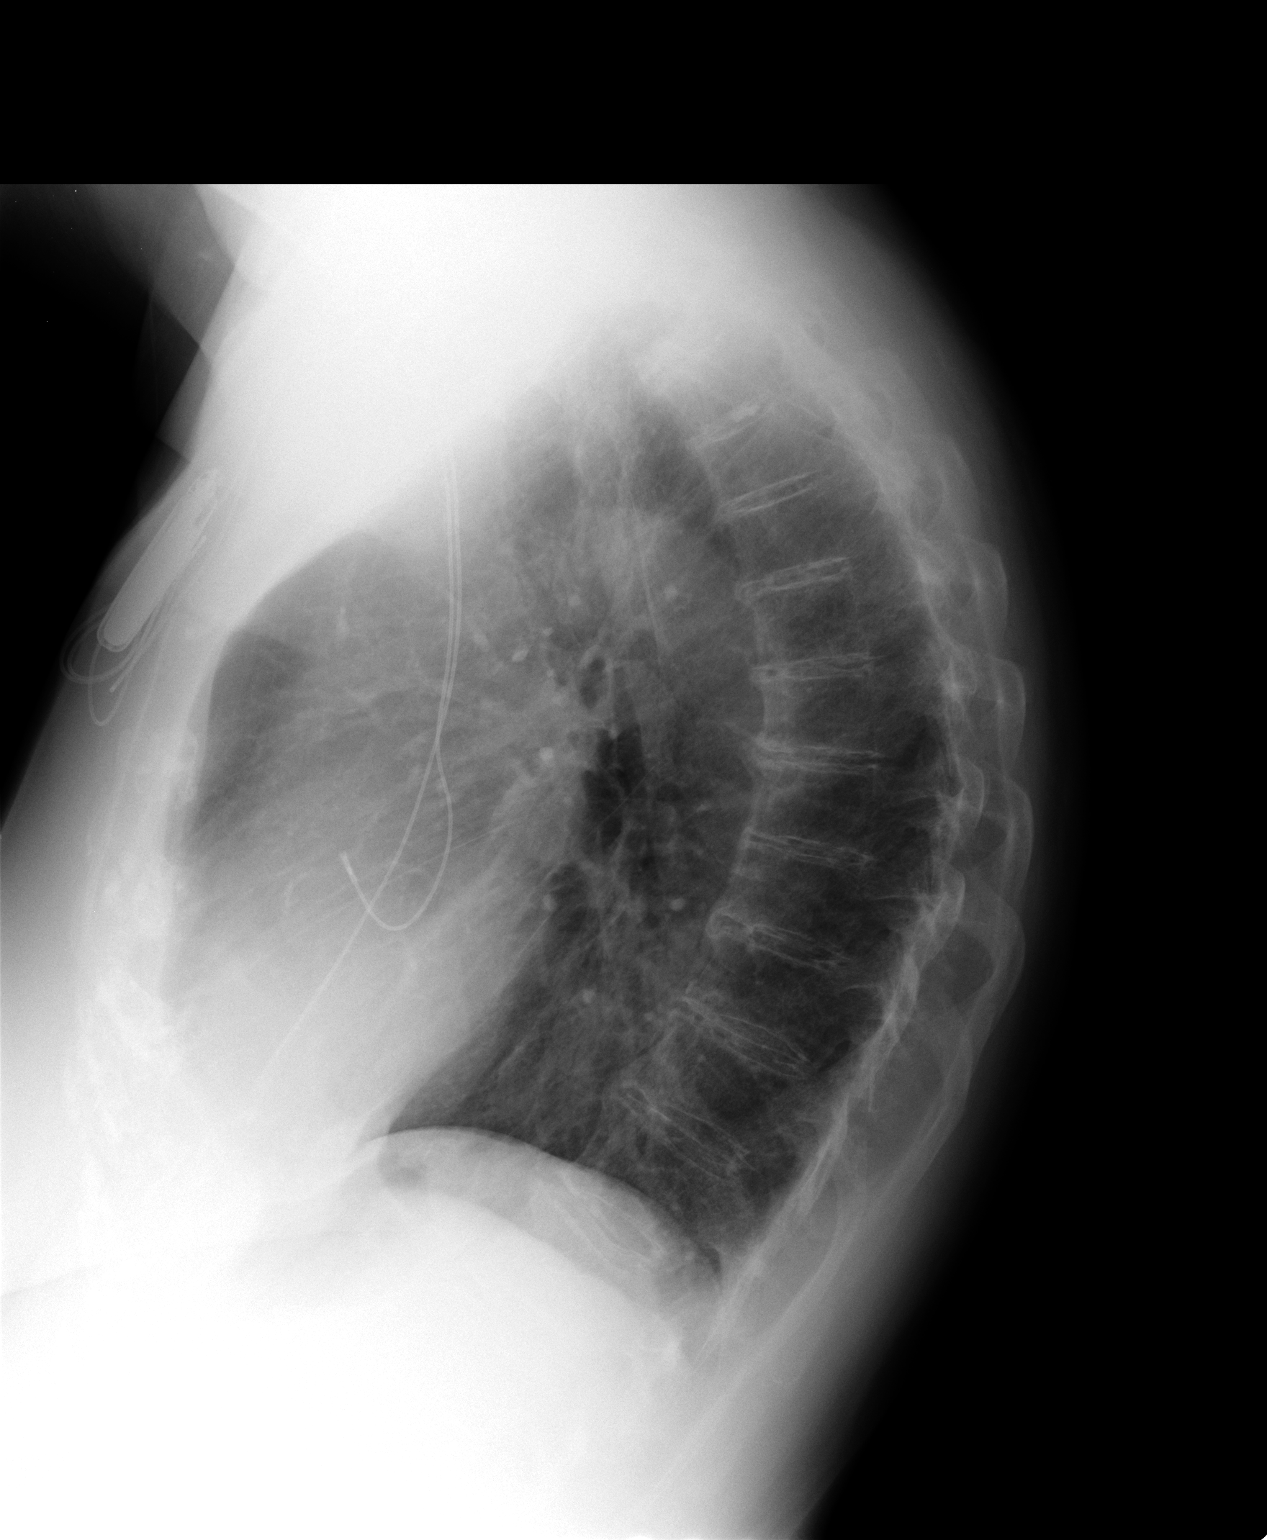

[2 of 2 positions shown; findings below may reference images not displayed]

FINDINGS: A dual lead pacer is in place and stable.  Changes of
COPD are evident and taking this into consideration heart size is
upper limits of normal and stable. The lung fields demonstrate some
diffuse increase in interstitial markings compatible with the
patient's history of smoking.  No focal infiltrates or signs of
congestive failure seen.  No pleural fluid is noted.  Stable
bilateral apical pleural thickening is identified.

Bony structures demonstrate mild diffuse degenerative change of the
thoracic spine and are otherwise stable.

Vascular calcification is identified overlying the region of the
right carotid artery.
IMPRESSION: Stable COPD changes with no new acute or focal abnormalities noted.

## 2012-07-24 ENCOUNTER — Encounter: Payer: Self-pay | Admitting: Internal Medicine

## 2012-08-04 ENCOUNTER — Ambulatory Visit (INDEPENDENT_AMBULATORY_CARE_PROVIDER_SITE_OTHER): Payer: Medicare Other | Admitting: Family Medicine

## 2012-08-04 ENCOUNTER — Telehealth: Payer: Self-pay | Admitting: Internal Medicine

## 2012-08-04 ENCOUNTER — Encounter: Payer: Self-pay | Admitting: Family Medicine

## 2012-08-04 VITALS — BP 150/74 | HR 72 | Ht 64.0 in | Wt 168.0 lb

## 2012-08-04 DIAGNOSIS — E781 Pure hyperglyceridemia: Secondary | ICD-10-CM

## 2012-08-04 DIAGNOSIS — F17200 Nicotine dependence, unspecified, uncomplicated: Secondary | ICD-10-CM

## 2012-08-04 DIAGNOSIS — Z79899 Other long term (current) drug therapy: Secondary | ICD-10-CM

## 2012-08-04 DIAGNOSIS — I10 Essential (primary) hypertension: Secondary | ICD-10-CM

## 2012-08-04 DIAGNOSIS — N289 Disorder of kidney and ureter, unspecified: Secondary | ICD-10-CM

## 2012-08-04 LAB — LIPID PANEL
HDL: 52 mg/dL (ref >39)
LDL Cholesterol: 71 mg/dL (ref 0–99)
Triglycerides: 169 mg/dL — ABNORMAL HIGH (ref <150)
VLDL: 34 mg/dL (ref 0–40)

## 2012-08-04 LAB — COMPREHENSIVE METABOLIC PANEL
ALT: <8 U/L (ref 0–35)
CO2: 30 mEq/L (ref 19–32)
Creat: 1.25 mg/dL — ABNORMAL HIGH (ref 0.50–1.10)
Total Bilirubin: 0.5 mg/dL (ref 0.3–1.2)

## 2012-08-04 MED ORDER — BUMETANIDE 1 MG PO TABS: 1.0000 mg | ORAL_TABLET | Freq: Every day | ORAL | Status: DC

## 2012-08-04 MED ORDER — FENOFIBRATE 54 MG PO TABS: 54.0000 mg | ORAL_TABLET | Freq: Every day | ORAL | Status: DC

## 2012-08-04 MED ORDER — METOPROLOL TARTRATE 25 MG PO TABS: 75.0000 mg | ORAL_TABLET | Freq: Every day | ORAL | Status: DC

## 2012-08-04 NOTE — Patient Instructions (Signed)
Restart metoprolol Work on quitting smoking Look into cost of fenofibrate.  If it is more affordable at another pharmacy, let us know where to send refill.  If you prefer to change to gemfibrozil for cost purposes, call us and let us know (which pharmacy)   Please try and quit smoking--start thinking about why/when you smoke (habit, boredom, stress) in order to come up with effective strategies to cut back or quit. Available resources to help you quit include free counseling through [facility] Quitline ([place]Quitline.com or 1-800-QUITNOW), smoking cessation classes through [facility] (call to find out schedule), over-the-counter nicotine replacements, and e-cigarettes (although this may not help break the hand-mouth habit).  Many insurance companies also have smoking cessation programs (which may decrease the cost of patches, meds if enrolled).  If these methods are not effective for you, and you are motivated to quit, return to discuss the possibility of prescription medications.

## 2012-08-04 NOTE — Telephone Encounter (Signed)
Pt came by after appointment and states she went [facility] and talked with them and the gemfibrozil is $30 dollars higher than what fenofibrate is at [facility] so she has cancelled all the meds that you sent over to [facility] and wants all 3 meds sent over to [facility] that she will continue to use [facility]

## 2012-08-04 NOTE — Telephone Encounter (Signed)
Done (through office visit)

## 2012-08-04 NOTE — Progress Notes (Signed)
Chief Complaint  Patient presents with  . med check    fasting med check    Patient is asking about whether her fenofibrate can be changed to gemfibrozil, as the cost of the former went up significantly.  She is tolerating without side effects.  Her insurance told her to check with other pharmacies, to see if she can get a better price at a larger pharmacy (currently gets from [facility]).  She has not yet checked prices elsewhere.  She isn't having any side effects.  Last labs were in 10/2011 and lipids were well controlled. She is fasting today for labs.  She has been out of metoprolol for 3 weeks, wondering if she needs to continue it, since her blood pressure was okay today by the nurse.  She denies any heart disease (other than being pacer-dependent).  Denies headache, dizziness, chest pain, palpitations, edema.  She also does not ever recall being on ramipril, which is listed in her med list in computer.  Review of paper chart shows that ramipril was started during a hospitalization, but looks like it was discontinued at some point in 2012-2013.  She has not been taking this in over a year.   She checks BP at [facility] about once a month, and runs 120-130/80 (when taking her medication).  Past Medical History  Diagnosis Date  . Sick sinus syndrome with bradycardia  . Pacemaker     sees Dr. Rubie Maid [facility]) annually. Pacemaker replaced 2007 Jenne Campus)  . Urge incontinence   . Hypertension   . Hypertriglyceridemia   . Chest wall pain 2003    atypical, now resolved  . Diverticulitis     with DT flare in 6/09  . Macular degeneration     Dr. Sharlot Gowda  . CKD (chronic kidney disease), stage III     Cr 1.27, GFR 40 (07/2011)  . Tobacco use disorder   . Gout    Past Surgical History  Procedure Laterality Date  . Pacemaker insertion  1994, 2000, 2007  . Cholecystectomy  1998    Dr. Gerrit Friends  . Appendectomy  1952  . Tubal ligation  1960  . Abdominal hysterectomy  1978    partial  . Eye surgery   11/2004    cataract surgery b/l - Dr.Bergen  . Colon surgery  11/2009    vaginal-colon fistula repair (Drs. Delane Ginger)  . Cardiac catheterization  2000  . Cystoscopy  2002    Dr. Wanda Plump   History   Social History  . Marital Status: Divorced    Spouse Name: N/A    Number of Children: 4  . Years of Education: N/A   Occupational History  . retired Agricultural engineer    Social History Main Topics  . Smoking status: Current Every Day Smoker -- 0.30 packs/day for 40 years  . Smokeless tobacco: Not on file     Comment: smokes 1 pack every 2-3 days  . Alcohol Use: No  . Drug Use: No  . Sexually Active: Not on file   Other Topics Concern  . Not on file   Social History Narrative   Lives alone, 1 cat.  3 children at the beach, Gavin Pound Banker at [employer]) lives in [place].   Current Outpatient Prescriptions on File Prior to Visit  Medication Sig Dispense Refill  . aspirin 81 MG tablet Take 81 mg by mouth daily.      . bumetanide (BUMEX) 1 MG tablet Take 1 mg by mouth daily.      Marland Kitchen  calcium carbonate (OS-CAL) 600 MG TABS Take 630 mg by mouth daily.       . fenofibrate 54 MG tablet Take 54 mg by mouth daily.      . metoprolol tartrate (LOPRESSOR) 25 MG tablet Take 75 mg by mouth daily. 50mg  q am and 25mg  q evening      . Multiple Vitamins-Minerals (MULTIVITAMIN WITH MINERALS) tablet Take 1 tablet by mouth daily.      . Multiple Vitamins-Minerals (OCUVITE PRESERVISION PO) Take 1 tablet by mouth daily.      . polycarbophil (FIBERCON) 625 MG tablet Take 1,875 mg by mouth daily.       No current facility-administered medications on file prior to visit.   Allergies  Allergen Reactions  . Morphine And Related Nausea And Vomiting   ROS:  Denies headaches, dizziness.  Reports "Grumbling" in L side of stomach since surgery--not painful.  Bowels are normal.  No nausea, vomiting, blood in stool.  No fevers, URI symptoms, just a smoker's cough. No rash, edema, bleeding/bruising, depression, or  other concerns.  See HPI  PHYSICAL EXAM: BP 130/80  Pulse 72  Ht 5\' 4"  (1.626 m)  Wt 168 lb (76.204 kg)  BMI 28.82 kg/m2 150/74 on repeat by MD RA Well developed, pleasant, talkative female who appears younger than stated age HEENT:  Conjunctiva clear.  OP clear Neck: no lymphadenopathy, thyromegaly, carotid bruit Heart: regular rate and rhythm without murmur Lungs: sounds are somewhat distant, but otherwise normal.  No wheezes, rales, ronchi Abdomen: soft, nontender, active bowel sounds, no mass Extremities: no edema, somewhat weakened pulses bilaterally Skin: no rash Psych: normal mood, affect, hygiene and grooming.  Somewhat excitable Neuro: alert and oriented. Normal gait, cranial nerves grossly intact  ASSESSMENT/PLAN:  Essential hypertension, benign - Plan: Comprehensive metabolic panel, bumetanide (BUMEX) 1 MG tablet, metoprolol tartrate (LOPRESSOR) 25 MG tablet, DISCONTINUED: bumetanide (BUMEX) 1 MG tablet, DISCONTINUED: metoprolol tartrate (LOPRESSOR) 25 MG tablet  Pure hyperglyceridemia - Plan: Lipid panel, fenofibrate 54 MG tablet  Encounter for long-term (current) use of other medications - Plan: Lipid panel, Comprehensive metabolic panel  Tobacco use disorder  Renal insufficiency  Restart metoprolol; continue to periodically monitor BP at [facility]. Work on quitting smoking Look into cost of fenofibrate.  If it is more affordable at another pharmacy, let us know where to send refill.  If prefers to change to gemfibrozil for cost purposes, likely can use just once daily dosing, given that she is on lower dose of fenofibrate.  Will need recheck of labs in 2-3 months after changing medication.

## 2012-08-08 ENCOUNTER — Other Ambulatory Visit: Payer: Self-pay | Admitting: *Deleted

## 2012-08-08 DIAGNOSIS — I10 Essential (primary) hypertension: Secondary | ICD-10-CM

## 2012-08-08 DIAGNOSIS — E781 Pure hyperglyceridemia: Secondary | ICD-10-CM

## 2012-08-08 MED ORDER — BUMETANIDE 1 MG PO TABS: 1.0000 mg | ORAL_TABLET | Freq: Every day | ORAL | Status: DC

## 2012-08-08 MED ORDER — METOPROLOL TARTRATE 25 MG PO TABS: 75.0000 mg | ORAL_TABLET | Freq: Every day | ORAL | Status: DC

## 2012-08-08 MED ORDER — FENOFIBRATE 54 MG PO TABS: 54.0000 mg | ORAL_TABLET | Freq: Every day | ORAL | Status: DC

## 2012-08-22 ENCOUNTER — Other Ambulatory Visit: Payer: Self-pay | Admitting: Family Medicine

## 2012-08-22 DIAGNOSIS — Z1231 Encounter for screening mammogram for malignant neoplasm of breast: Secondary | ICD-10-CM

## 2012-09-16 ENCOUNTER — Ambulatory Visit (HOSPITAL_COMMUNITY)
Admission: RE | Admit: 2012-09-16 | Discharge: 2012-09-16 | Disposition: A | Discharge status: Home / Self Care | Payer: Medicare Other | Source: Ambulatory Visit | Attending: Family Medicine | Admitting: Family Medicine

## 2012-09-16 DIAGNOSIS — Z1231 Encounter for screening mammogram for malignant neoplasm of breast: Secondary | ICD-10-CM | POA: Insufficient documentation

## 2012-10-23 ENCOUNTER — Other Ambulatory Visit: Payer: Self-pay | Admitting: Cardiovascular Disease

## 2012-10-23 DIAGNOSIS — I495 Sick sinus syndrome: Secondary | ICD-10-CM

## 2012-10-23 LAB — PACEMAKER DEVICE OBSERVATION

## 2012-10-27 ENCOUNTER — Encounter: Payer: Self-pay | Admitting: *Deleted

## 2012-10-27 LAB — REMOTE PACEMAKER DEVICE
AL IMPEDENCE PM: 436 Ohm
ATRIAL PACING PM: 98
BATTERY VOLTAGE: 2.78 V
RV LEAD THRESHOLD: 1.25 V

## 2012-11-19 ENCOUNTER — Encounter: Payer: Self-pay | Admitting: Cardiovascular Disease

## 2013-04-02 ENCOUNTER — Telehealth: Payer: Self-pay | Admitting: Family Medicine

## 2013-04-02 ENCOUNTER — Other Ambulatory Visit: Payer: Self-pay | Admitting: *Deleted

## 2013-04-02 DIAGNOSIS — I10 Essential (primary) hypertension: Secondary | ICD-10-CM

## 2013-04-02 DIAGNOSIS — E781 Pure hyperglyceridemia: Secondary | ICD-10-CM

## 2013-04-02 MED ORDER — METOPROLOL TARTRATE 25 MG PO TABS: 75.0000 mg | ORAL_TABLET | Freq: Every day | ORAL | Status: DC

## 2013-04-02 MED ORDER — BUMETANIDE 1 MG PO TABS: 1.0000 mg | ORAL_TABLET | Freq: Every day | ORAL | Status: DC

## 2013-04-02 MED ORDER — FENOFIBRATE 54 MG PO TABS: 54.0000 mg | ORAL_TABLET | Freq: Every day | ORAL | Status: DC

## 2013-04-02 NOTE — Telephone Encounter (Signed)
Done for 30 days worth of bumex, fenofibrate and metoprolol and pt sch appt for 04/22/13.

## 2013-04-02 NOTE — Telephone Encounter (Signed)
She is past due for med check.  Last labs were 07/2012, due for med check and labs (fasting med check) in September. No appt is scheduled.  Please schedule med check; ok to auth meds x 1 month

## 2013-04-16 ENCOUNTER — Ambulatory Visit: Payer: Medicare Other | Admitting: Cardiovascular Disease

## 2013-04-21 ENCOUNTER — Ambulatory Visit (INDEPENDENT_AMBULATORY_CARE_PROVIDER_SITE_OTHER): Payer: Medicare Other | Admitting: Cardiovascular Disease

## 2013-04-21 ENCOUNTER — Encounter: Payer: Self-pay | Admitting: Cardiovascular Disease

## 2013-04-21 VITALS — BP 140/80 | HR 80 | Resp 16 | Ht 64.0 in | Wt 168.5 lb

## 2013-04-21 DIAGNOSIS — R599 Enlarged lymph nodes, unspecified: Secondary | ICD-10-CM

## 2013-04-21 DIAGNOSIS — I10 Essential (primary) hypertension: Secondary | ICD-10-CM

## 2013-04-21 DIAGNOSIS — E781 Pure hyperglyceridemia: Secondary | ICD-10-CM

## 2013-04-21 DIAGNOSIS — R590 Localized enlarged lymph nodes: Secondary | ICD-10-CM

## 2013-04-21 DIAGNOSIS — Z950 Presence of cardiac pacemaker: Secondary | ICD-10-CM

## 2013-04-21 DIAGNOSIS — F17200 Nicotine dependence, unspecified, uncomplicated: Secondary | ICD-10-CM

## 2013-04-21 DIAGNOSIS — I495 Sick sinus syndrome: Secondary | ICD-10-CM

## 2013-04-21 LAB — PACEMAKER DEVICE OBSERVATION

## 2013-04-21 NOTE — Progress Notes (Signed)
Patient ID: [name], female   DOB: 1928/04/16, 79 y.o.   MRN: [id number]      Reason for office visit Pacemaker followup, hypertension, hypertriglyceridemia  Mrs. Monforte has sinus node dysfunction which has progressed to atrial standstill. She has normal atrioventricular conduction. She is pacemaker dependent. She initially had a pacemaker implanted in 1994 and has subsequently had 2 generator change out, most recently in 2007. She still has the original unipolar atrial and ventricular leads from 1994.  She does not have problems with syncope or palpitations. Her pacemaker still functioning normally but sensing on the ventricular channel has gradually deteriorated and now R-wave amplitude is about 2 mV. In addition there is evidence of recorded noise especially on the atrial channel, less prominently on the ventricular channel. We were able to reduce the artifact using isometric exercises in the office today.  She has noticed bilateral rubbery lumps underneath her mandible. These are painless but appear to be slowly growing.   Allergies  Allergen Reactions  . Morphine And Related Nausea And Vomiting    Current Outpatient Prescriptions  Medication Sig Dispense Refill  . aspirin 81 MG tablet Take 81 mg by mouth daily.      . bumetanide (BUMEX) 1 MG tablet Take 1 tablet (1 mg total) by mouth daily.  30 tablet  0  . calcium carbonate (OS-CAL) 600 MG TABS Take 630 mg by mouth daily.       . fenofibrate 54 MG tablet Take 1 tablet (54 mg total) by mouth daily.  30 tablet  0  . metoprolol tartrate (LOPRESSOR) 25 MG tablet Take 3 tablets (75 mg total) by mouth daily. 50mg  q am and 25mg  q evening  90 tablet  0  . Multiple Vitamins-Minerals (MULTIVITAMIN WITH MINERALS) tablet Take 1 tablet by mouth daily.      . Multiple Vitamins-Minerals (OCUVITE PRESERVISION PO) Take 1 tablet by mouth daily.      . polycarbophil (FIBERCON) 625 MG tablet Take 1,875 mg by mouth daily.       No current  facility-administered medications for this visit.    Past Medical History  Diagnosis Date  . Sick sinus syndrome with bradycardia  . Pacemaker     sees Dr. Rubie Maid [facility]) annually. Pacemaker replaced 2007 [facility])  . Urge incontinence   . Hypertension   . Hypertriglyceridemia   . Chest wall pain 2003    atypical, now resolved  . Diverticulitis     with DT flare in 6/09  . Macular degeneration     Dr. Sharlot Gowda  . CKD (chronic kidney disease), stage III     Cr 1.27, GFR 40 (07/2011)  . Tobacco use disorder   . Gout     Past Surgical History  Procedure Laterality Date  . Pacemaker insertion  1994, 2000, 2007  . Cholecystectomy  1998    Dr. Gerrit Friends  . Appendectomy  1952  . Tubal ligation  1960  . Abdominal hysterectomy  1978    partial  . Eye surgery  11/2004    cataract surgery b/l - Dr.Bergen  . Colon surgery  11/2009    vaginal-colon fistula repair (Drs. Delane Ginger)  . Cardiac catheterization  2000  . Cystoscopy  2002    Dr. Wanda Plump    Family History  Problem Relation Age of Onset  . Diabetes Mother   . Heart disease Mother   . Hyperlipidemia Mother   . Hypertension Mother   . Heart disease Father     pacemaker,  CABG  . Hyperlipidemia Father   . Hypertension Father   . Cancer Sister     breast cancer 55's, then recurrence, metastatic, died at 85  . Diabetes Brother   . Alcohol abuse Brother   . Heart disease Brother   . Crohn's disease Daughter   . Alzheimer's disease Brother 59  . Ulcerative colitis Daughter   . Heart disease Other 62  . Stroke Other 62  . Cancer Other     breast    History   Social History  . Marital Status: Divorced    Spouse Name: N/A    Number of Children: 4  . Years of Education: N/A   Occupational History  . retired Agricultural engineer    Social History Main Topics  . Smoking status: Former Smoker -- 0.30 packs/day for 40 years    Quit date: 01/12/2013  . Smokeless tobacco: Not on file     Comment: smokes 1 pack every  2-3 days  . Alcohol Use: No  . Drug Use: No  . Sexual Activity: Not on file   Other Topics Concern  . Not on file   Social History Narrative   Lives alone, 1 cat.  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    Review of systems: The patient specifically denies any chest pain at rest or with exertion, dyspnea at rest or with exertion, orthopnea, paroxysmal nocturnal dyspnea, syncope, palpitations, focal neurological deficits, intermittent claudication, lower extremity edema, unexplained weight gain, cough, hemoptysis or wheezing.  The patient also denies abdominal pain, nausea, vomiting, dysphagia, diarrhea, constipation, polyuria, polydipsia, dysuria, hematuria, frequency, urgency, abnormal bleeding or bruising, fever, chills, unexpected weight changes, mood swings, change in skin or hair texture, change in voice quality, auditory or visual problems, allergic reactions or rashes, new musculoskeletal complaints other than usual "aches and pains".   PHYSICAL EXAM BP 140/80  Pulse 80  Resp 16  Ht 5\' 4"  (1.626 m)  Wt 168 lb 8 oz (76.431 kg)  BMI 28.91 kg/m2  General: Alert, oriented x3, no distress Head: no evidence of trauma, PERRL, EOMI, no exophtalmos or lid lag, no myxedema, no xanthelasma; normal ears, nose and oropharynx Slightly asymmetrical masses are seen in the submandibular area bilaterally, roughly 2.5 cm on the left and 1.5 cm on the right. These are mobile to the deep planes into the skin and have a rubbery texture. Are not painful or tender. There is no overlying erythema. Note that she is edentulous. There are no obvious oral ulcers or masses Neck: normal jugular venous pulsations and no hepatojugular reflux; brisk carotid pulses without delay and no carotid bruits Chest: clear to auscultation, no signs of consolidation by percussion or palpation, normal fremitus, symmetrical and full respiratory excursions; subclavian pacemaker site appears  healthy Cardiovascular: normal position and quality of the apical impulse, regular rhythm, normal first and second heart sounds, no murmurs, rubs or gallops Abdomen: no tenderness or distention, no masses by palpation, no abnormal pulsatility or arterial bruits, normal bowel sounds, no hepatosplenomegaly Extremities: no clubbing, cyanosis or edema; 2+ radial, ulnar and brachial pulses bilaterally; 2+ right femoral, posterior tibial and dorsalis pedis pulses; 2+ left femoral, posterior tibial and dorsalis pedis pulses; no subclavian or femoral bruits Neurological: grossly nonfocal   EKG: Atrial paced ventricular sensed  Lipid Panel     Component Value Date/Time   CHOL 157 08/04/2012 1545   TRIG 169* 08/04/2012 1545   HDL 52 08/04/2012 1545   CHOLHDL  3.0 08/04/2012 1545   VLDL 34 08/04/2012 1545   LDLCALC 71 08/04/2012 1545    BMET    Component Value Date/Time   NA 142 08/04/2012 1545   K 4.1 08/04/2012 1545   CL 103 08/04/2012 1545   CO2 30 08/04/2012 1545   GLUCOSE 89 08/04/2012 1545   BUN 25* 08/04/2012 1545   CREATININE 1.25* 08/04/2012 1545   CREATININE 0.95 12/16/2009 0459   CALCIUM 10.5 08/04/2012 1545   GFRNONAA 57* 12/16/2009 0459   GFRAA  Value: >60        The eGFR has been calculated using the MDRD equation. This calculation has not been validated in all clinical situations. eGFR's persistently <60 mL/min signify possible Chronic Kidney Disease. 12/16/2009 0459     ASSESSMENT AND PLAN Pacemaker - dual chamber, Medtronic 2007, unipolar leads 1994 She is pacemaker dependent (atrial standstill), but has normal atrioventricular conduction. 100% atrial pacing, only 2% ventricular pacing. There are some issues with noise recorded on both atrial and ventricular channels, although this is more obvious on the atrial channel. It can be reproduced with isometric upper torso exercises. So far has not interfered with device function in a serious manner. Unfortunately ventricular sensing is now  mediocre, with R waves of only 2 mV. Since atrial pacing has not really been necessary, no aggressive intervention is necessary at this time. We'll continue monitoring. When it comes time for her next generator change (anticipated and roughly 5 years) she should probably receive new modern bipolar leads. Because of the noise recordings, atrial lead sensitivity was adjusted upwards. Unfortunately there is no room to adjust ventricular lead sensitivity further due to the small R waves. She'll continue with remote [facility] monitoring every 3 months  Tobacco use disorder She is congratulated on successful smoking cessation  Submandibular lymphadenopathy She has a followup with her primary care physician tomorrow and I strongly encourage her to discuss the palpable masses in her submandibular area, almost certainly bilateral lymphadenopathy. She does not have any teeth in her mandible. There are no masses that I can find in her thyroid. There is no cervical lymphadenopathy or supraclavicular lymphadenopathy. She is definitely at risk for head and neck malignancies because of her history of smoking. She'll probably need referral to a ENT specialist.  Essential hypertension, benign Her blood pressure is borderline high today, but typically her blood pressure is substantially lower. No adjustments are made for medications to  Pure hyperglyceridemia Earlier this year triglycerides are mildly elevated at 169. No adjustment appears necessary in her medications. Monitor periodically.  Orders Placed This Encounter  Procedures  . EKG 12-Lead    Deisy Ozbun  Thurmon Fair, MD, [facility] [phone] office [phone] pager

## 2013-04-21 NOTE — Assessment & Plan Note (Signed)
Her blood pressure is borderline high today, but typically her blood pressure is substantially lower. No adjustments are made for medications to

## 2013-04-21 NOTE — Assessment & Plan Note (Signed)
She is congratulated on successful smoking cessation

## 2013-04-21 NOTE — Assessment & Plan Note (Signed)
She is pacemaker dependent (atrial standstill), but has normal atrioventricular conduction. 100% atrial pacing, only 2% ventricular pacing. There are some issues with noise recorded on both atrial and ventricular channels, although this is more obvious on the atrial channel. It can be reproduced with isometric upper torso exercises. So far has not interfered with device function in a serious manner. Unfortunately ventricular sensing is now mediocre, with R waves of only 2 mV. Since atrial pacing has not really been necessary, no aggressive intervention is necessary at this time. We'll continue monitoring. When it comes time for her next generator change (anticipated and roughly 5 years) she should probably receive new modern bipolar leads. Because of the noise recordings, atrial lead sensitivity was adjusted upwards. Unfortunately there is no room to adjust ventricular lead sensitivity further due to the small R waves. She'll continue with remote [facility] monitoring every 3 months

## 2013-04-21 NOTE — Patient Instructions (Signed)
Remote monitoring is used to monitor your pacemaker from home. This monitoring reduces the number of office visits required to check your device to one time per year. It allows [place] to keep an eye on the functioning of your device to ensure it is working properly. You are scheduled for a device check from home on 07-23-2013. You may send your transmission at any time that day. If you have a wireless device, the transmission will be sent automatically. After your physician reviews your transmission, you will receive a postcard with your next transmission date.  Your physician recommends that you schedule a follow-up appointment in: 12 months

## 2013-04-21 NOTE — Assessment & Plan Note (Signed)
Earlier this year triglycerides are mildly elevated at 169. No adjustment appears necessary in her medications. Monitor periodically.

## 2013-04-21 NOTE — Assessment & Plan Note (Signed)
She has a followup with her primary care physician tomorrow and I strongly encourage her to discuss the palpable masses in her submandibular area, almost certainly bilateral lymphadenopathy. She does not have any teeth in her mandible. There are no masses that I can find in her thyroid. There is no cervical lymphadenopathy or supraclavicular lymphadenopathy. She is definitely at risk for head and neck malignancies because of her history of smoking. She'll probably need referral to a ENT specialist.

## 2013-04-22 ENCOUNTER — Ambulatory Visit (INDEPENDENT_AMBULATORY_CARE_PROVIDER_SITE_OTHER): Payer: Medicare Other | Admitting: Family Medicine

## 2013-04-22 ENCOUNTER — Encounter: Payer: Self-pay | Admitting: Family Medicine

## 2013-04-22 VITALS — BP 112/70 | HR 60 | Ht 65.0 in | Wt 167.0 lb

## 2013-04-22 DIAGNOSIS — I10 Essential (primary) hypertension: Secondary | ICD-10-CM

## 2013-04-22 DIAGNOSIS — N289 Disorder of kidney and ureter, unspecified: Secondary | ICD-10-CM

## 2013-04-22 DIAGNOSIS — Z0000 Encounter for general adult medical examination without abnormal findings: Secondary | ICD-10-CM

## 2013-04-22 DIAGNOSIS — R590 Localized enlarged lymph nodes: Secondary | ICD-10-CM

## 2013-04-22 DIAGNOSIS — M109 Gout, unspecified: Secondary | ICD-10-CM

## 2013-04-22 DIAGNOSIS — R599 Enlarged lymph nodes, unspecified: Secondary | ICD-10-CM

## 2013-04-22 DIAGNOSIS — E781 Pure hyperglyceridemia: Secondary | ICD-10-CM

## 2013-04-22 LAB — CBC WITH DIFFERENTIAL/PLATELET
Eosinophils Absolute: 0.2 10*3/uL (ref 0.0–0.7)
HCT: 38.8 % (ref 36.0–46.0)
Hemoglobin: 13.3 g/dL (ref 12.0–15.0)
Lymphocytes Relative: 26 % (ref 12–46)
MCHC: 34.3 g/dL (ref 30.0–36.0)
Monocytes Absolute: 0.8 10*3/uL (ref 0.1–1.0)
Monocytes Relative: 9 % (ref 3–12)
Neutro Abs: 5.9 10*3/uL (ref 1.7–7.7)
Neutrophils Relative %: 63 % (ref 43–77)
Platelets: 240 10*3/uL (ref 150–400)
RDW: 13.3 % (ref 11.5–15.5)
WBC: 9.3 10*3/uL (ref 4.0–10.5)

## 2013-04-22 LAB — COMPREHENSIVE METABOLIC PANEL
ALT: <8 U/L (ref 0–35)
AST: 23 U/L (ref 0–37)
Albumin: 4.1 g/dL (ref 3.5–5.2)
BUN: 29 mg/dL — ABNORMAL HIGH (ref 6–23)
CO2: 28 mEq/L (ref 19–32)
Calcium: 10 mg/dL (ref 8.4–10.5)
Chloride: 101 mEq/L (ref 96–112)
Creat: 1.17 mg/dL — ABNORMAL HIGH (ref 0.50–1.10)
Glucose, Bld: 89 mg/dL (ref 70–99)
Potassium: 4.2 mEq/L (ref 3.5–5.3)

## 2013-04-22 LAB — LIPID PANEL
Cholesterol: 145 mg/dL (ref 0–200)
HDL: 51 mg/dL (ref >39)
Triglycerides: 144 mg/dL (ref <150)

## 2013-04-22 MED ORDER — BUMETANIDE 1 MG PO TABS: 1.0000 mg | ORAL_TABLET | Freq: Every day | ORAL | Status: DC

## 2013-04-22 MED ORDER — FENOFIBRATE 54 MG PO TABS: 54.0000 mg | ORAL_TABLET | Freq: Every day | ORAL | Status: DC

## 2013-04-22 MED ORDER — METOPROLOL TARTRATE 25 MG PO TABS: 75.0000 mg | ORAL_TABLET | Freq: Every day | ORAL | Status: DC

## 2013-04-22 NOTE — Progress Notes (Signed)
Chief Complaint  Patient presents with  . Hypertension    fasting med check. 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 Pt declines flu vaccine today.    She quit smoking 01/12/2013 cold [place].  She was seen yesterday by her cardiologist, who was concerned about swollen glands in her neck, to be re-evaluated by [place] today. She noticed knots on her neck, the left side larger than the right, about a month ago.  She denies any sore throat, not tender to touch, and has not changed in size over the last month.  Otherwise she has been doing well, no complaints.    Dyslipidemia:  She is compliant with taking her medication, denies side effects.  She admits to eating candy, and fried foods once a week.  She is fasting for labs today.  Hypertension follow-up:  Blood pressures elsewhere are 130/70 ([employer]).  Denies dizziness, headaches, chest pain.  Denies side effects of medications.  She got very sick in the past from a flu shot, and has refused all vaccines. She hasn't had a tetanus shot at least since 2002 (when she retired).  Does not believe she has ever had pneumonia vaccine, and has declined flu shots in years past.  Past Medical History  Diagnosis Date  . Sick sinus syndrome with bradycardia  . Pacemaker     sees Dr. Rubie Maid [facility]) annually. Pacemaker replaced 2007 [facility])  . Urge incontinence   . Hypertension   . Hypertriglyceridemia   . Chest wall pain 2003    atypical, now resolved  . Diverticulitis     with DT flare in 6/09  . Macular degeneration     Dr. Sharlot Gowda  . CKD (chronic kidney disease), stage III     Cr 1.27, GFR 40 (07/2011)  . Tobacco use disorder     quit 01/2013  . Gout     R hand   Past Surgical History  Procedure Laterality Date  . Pacemaker insertion  1994, 2000, 2007  . Cholecystectomy  1998    Dr. Gerrit Friends  . Appendectomy  1952  . Tubal  ligation  1960  . Abdominal hysterectomy  1978    partial  . Eye surgery  11/2004    cataract surgery b/l - Dr.Bergen  . Colon surgery  11/2009    vaginal-colon fistula repair (Drs. Delane Ginger)  . Cardiac catheterization  2000  . Cystoscopy  2002    Dr. Wanda Plump   History   Social History  . Marital Status: Divorced    Spouse Name: N/A    Number of Children: 4  . Years of Education: N/A   Occupational History  . retired Agricultural engineer    Social History Main Topics  . Smoking status: Former Smoker -- 0.30 packs/day for 40 years    Quit date: 01/12/2013  . Smokeless tobacco: Never Used     Comment: smokes 1 pack every 2-3 days  . Alcohol Use: No  . Drug Use: No  . Sexual Activity: Not on file   Other Topics Concern  . Not on file   Social History Narrative   Lives alone, 1 cat.  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   Current outpatient prescriptions:aspirin 81 MG tablet, Take 81 mg by mouth daily., Disp: , Rfl: ;  bumetanide (BUMEX) 1 MG tablet, Take 1  tablet (1 mg total) by mouth daily., Disp: 90 tablet, Rfl: 1;  calcium carbonate (OS-CAL) 600 MG TABS, Take 630 mg by mouth daily. , Disp: , Rfl: ;  fenofibrate 54 MG tablet, Take 1 tablet (54 mg total) by mouth daily., Disp: 90 tablet, Rfl: 1 metoprolol tartrate (LOPRESSOR) 25 MG tablet, Take 3 tablets (75 mg total) by mouth daily. 50mg  q am and 25mg  q evening, Disp: 270 tablet, Rfl: 1;  Multiple Vitamins-Minerals (MULTIVITAMIN WITH MINERALS) tablet, Take 1 tablet by mouth daily., Disp: , Rfl: ;  Multiple Vitamins-Minerals (OCUVITE PRESERVISION PO), Take 1 tablet by mouth daily., Disp: , Rfl: ;  polycarbophil (FIBERCON) 625 MG tablet, Take 1,875 mg by mouth daily., Disp: , Rfl:   Allergies  Allergen Reactions  . Morphine And Related Nausea And Vomiting   ROS: Denies headaches, dizziness, weight loss. No nausea, vomiting, blood in stool. No fevers, URI symptoms, just a smoker's cough. No  rash, edema, bleeding/bruising, depression, or other concerns. See HPI   PHYSICAL EXAM: BP 112/70  Pulse 60  Ht 5\' 5"  (1.651 m)  Wt 167 lb (75.751 kg)  BMI 27.79 kg/m2  Well developed, pleasant, talkative female who appears younger than stated age  HEENT: Conjunctiva clear. OP clear  Neck: no carotid bruit.  2cm anterior cervical LN on left, nontender, firm but mobile.  Smaller on the right.  boderline thyroid size. Heart: regular rate and rhythm without murmur  Lungs: sounds are somewhat distant, but otherwise normal. No wheezes, rales, ronchi  Abdomen: soft, nontender, active bowel sounds, no mass  Extremities: no edema, somewhat weakened pulses bilaterally  Skin: no rash  Psych: normal mood, affect, hygiene and grooming.  Neuro: alert and oriented. Normal gait, cranial nerves grossly intact  ASSESSMENT/PLAN:  Submandibular lymphadenopathy - nontender, in former smoker.  check labs.  likely will need imaging and/or referral to ENT - Plan: CBC with Differential  Renal insufficiency - Plan: Comprehensive metabolic panel  Pure hyperglyceridemia - Plan: Lipid panel, Comprehensive metabolic panel, fenofibrate 54 MG tablet  Essential hypertension, benign - controlled - Plan: Comprehensive metabolic panel, metoprolol tartrate (LOPRESSOR) 25 MG tablet, bumetanide (BUMEX) 1 MG tablet  Routine general medical examination at a health care facility - Plan: TSH  Gout - no recent problems with joint pain/swelling (previously had problems with R hand - Plan: Uric Acid  Immunizations:  Counseled extensively re: risks/benefits of TdaP, pneumonia vaccination and flu shot, but she declines.  Has living will, power of attorney, has paid for her funeral  Return for CPE/med check in 6 months.  Encouraged her to reconsider decision re: vaccines (can return for [place] prior to next visit)  Await labs--might need CT/u/s or referral to ENT (requests [facility])

## 2013-04-22 NOTE — Patient Instructions (Signed)
Continue your current medications. PLEASE consider getting pneumonia vaccine, tetanus shot (TdaP)--recommended every 10 years, and flu shots yearly.  Return in 6 months for a physical exam and follow-up.  We are checking labs today--depending on results, we will be either referring you for scans of the enlarged lymph node in your neck, or directly to ENT for further evaluation. We will be in touch in the next few days with your results.  Congratulations on quitting smoking!!

## 2013-04-23 ENCOUNTER — Telehealth: Payer: Self-pay | Admitting: *Deleted

## 2013-04-23 NOTE — Telephone Encounter (Signed)
Error

## 2013-04-23 NOTE — Telephone Encounter (Signed)
Called patient to let her know she is scheduled at [facility] with Dr.Wolicki for 05/15/13 @ 2:45pm.

## 2013-05-08 LAB — MDC_IDC_ENUM_SESS_TYPE_INCLINIC
Battery Remaining Longevity: 5
Brady Statistic AP VS Percent: 96.4 %
Lead Channel Impedance Value: 415 Ohm
Lead Channel Pacing Threshold Pulse Width: 0.4 ms
Lead Channel Sensing Intrinsic Amplitude: 4 mV
Lead Channel Setting Pacing Amplitude: 2.5 V
Lead Channel Setting Sensing Sensitivity: 2 mV

## 2013-05-19 ENCOUNTER — Telehealth: Payer: Self-pay | Admitting: Internal Medicine

## 2013-05-19 NOTE — Telephone Encounter (Signed)
Pt states that her bumex med is over $100 and she called her insurance and she can get hctz for $2.00 and sum or furosemide for $6.00 and sum. Please send to [facility] and please let pt know.    Also pt wanted to let you know she went and got her throat checked out like you asked and everything was ok and nothing to worry about

## 2013-05-19 NOTE — Telephone Encounter (Signed)
Please make sure we get the records from ENT (none in computer yet). I have been prescribing this for her,but really she needs to check with her cardiologist re: any changes in her diuretic

## 2013-05-20 ENCOUNTER — Telehealth: Payer: Self-pay | Admitting: Cardiovascular Disease

## 2013-05-20 MED ORDER — FUROSEMIDE 40 MG PO TABS: 40.0000 mg | ORAL_TABLET | Freq: Every day | ORAL | Status: DC

## 2013-05-20 NOTE — Telephone Encounter (Signed)
No answer - - wanted to know how much?  RN spoke to operator Willaim Sheng(Billie) she states the patient stated it cost her 28$70 -[facility].   Will forward to Dr Royann Shiversroitoru

## 2013-05-20 NOTE — Telephone Encounter (Signed)
Calling because she went to pick up her medication and it was too expensive and want to know if she can change from Bumetanide 1mg  to HCTZ which will only cost her $2.00.Marland Kitchen. Please call    Thanks

## 2013-05-20 NOTE — Telephone Encounter (Signed)
HCTZ is not an appropriate replacement, but can switch to furosemide 40 mg daily.

## 2013-05-20 NOTE — Telephone Encounter (Signed)
Spoke patient. Informed her that Dr C.order furosemide 40 mg in the place of BUMEX. PATIENT STATES HER PCP WANT HER TO CONTACT THE CARDIOLOGY ABOUT THE SWITCH IN MEDICATION.  E -SCRIBED FUROSEMIDE TO [facility] 90 DAY SUPPLY x 3 REFILLS  PT AWARE.

## 2013-05-20 NOTE — Telephone Encounter (Signed)
Pt was notified to go see her cardiologist about that med and called Dr. Lazarus SalinesWolicki office @ [phone] to get the office visit of pt from 05/15/13

## 2013-07-23 ENCOUNTER — Encounter: Payer: Medicare HMO | Admitting: *Deleted

## 2013-07-30 ENCOUNTER — Encounter: Payer: Self-pay | Admitting: *Deleted

## 2013-08-06 ENCOUNTER — Encounter: Payer: Self-pay | Admitting: Cardiovascular Disease

## 2013-08-06 ENCOUNTER — Ambulatory Visit (INDEPENDENT_AMBULATORY_CARE_PROVIDER_SITE_OTHER): Payer: Medicare HMO | Admitting: *Deleted

## 2013-08-06 DIAGNOSIS — I495 Sick sinus syndrome: Secondary | ICD-10-CM

## 2013-08-17 ENCOUNTER — Other Ambulatory Visit: Payer: Self-pay | Admitting: Family Medicine

## 2013-08-17 DIAGNOSIS — Z1231 Encounter for screening mammogram for malignant neoplasm of breast: Secondary | ICD-10-CM

## 2013-08-17 LAB — MDC_IDC_ENUM_SESS_TYPE_REMOTE
Battery Remaining Longevity: 56 mo
Brady Statistic AP VS Percent: 95 %
Brady Statistic AS VS Percent: 1 %
Lead Channel Pacing Threshold Amplitude: 0.625 V
Lead Channel Pacing Threshold Amplitude: 0.75 V
Lead Channel Pacing Threshold Pulse Width: 0.4 ms
Lead Channel Pacing Threshold Pulse Width: 0.4 ms
Lead Channel Setting Pacing Amplitude: 2.25 V
MDC IDC MSMT BATTERY IMPEDANCE: 774 Ohm
MDC IDC MSMT BATTERY VOLTAGE: 2.78 V
MDC IDC MSMT LEADCHNL RA IMPEDANCE VALUE: 419 Ohm
MDC IDC MSMT LEADCHNL RV IMPEDANCE VALUE: 419 Ohm
MDC IDC SESS DTM: 20150327155109
MDC IDC SET LEADCHNL RA PACING AMPLITUDE: 1.5 V
MDC IDC SET LEADCHNL RV PACING PULSEWIDTH: 0.4 ms
MDC IDC SET LEADCHNL RV SENSING SENSITIVITY: 2 mV
MDC IDC STAT BRADY AP VP PERCENT: 3 %
MDC IDC STAT BRADY AS VP PERCENT: 0 %

## 2013-09-10 ENCOUNTER — Encounter: Payer: Self-pay | Admitting: *Deleted

## 2013-09-18 ENCOUNTER — Ambulatory Visit (HOSPITAL_COMMUNITY)
Admission: RE | Admit: 2013-09-18 | Discharge: 2013-09-18 | Disposition: A | Discharge status: Home / Self Care | Payer: Medicare HMO | Source: Ambulatory Visit | Attending: Family Medicine | Admitting: Family Medicine

## 2013-09-18 DIAGNOSIS — Z1231 Encounter for screening mammogram for malignant neoplasm of breast: Secondary | ICD-10-CM | POA: Insufficient documentation

## 2013-09-18 LAB — HM PAP SMEAR: HM Pap smear: NORMAL

## 2013-09-18 LAB — HM DEXA SCAN: HM DEXA SCAN: NORMAL

## 2013-10-26 ENCOUNTER — Ambulatory Visit (INDEPENDENT_AMBULATORY_CARE_PROVIDER_SITE_OTHER): Payer: Medicare HMO | Admitting: Family Medicine

## 2013-10-26 ENCOUNTER — Encounter: Payer: Self-pay | Admitting: Family Medicine

## 2013-10-26 VITALS — BP 118/68 | HR 68 | Ht 64.5 in | Wt 172.0 lb

## 2013-10-26 DIAGNOSIS — Z0000 Encounter for general adult medical examination without abnormal findings: Secondary | ICD-10-CM

## 2013-10-26 DIAGNOSIS — I10 Essential (primary) hypertension: Secondary | ICD-10-CM

## 2013-10-26 DIAGNOSIS — N289 Disorder of kidney and ureter, unspecified: Secondary | ICD-10-CM

## 2013-10-26 DIAGNOSIS — E781 Pure hyperglyceridemia: Secondary | ICD-10-CM

## 2013-10-26 LAB — COMPREHENSIVE METABOLIC PANEL
ALT: <8 U/L (ref 0–35)
AST: 22 U/L (ref 0–37)
Albumin: 4.1 g/dL (ref 3.5–5.2)
Alkaline Phosphatase: 38 U/L — ABNORMAL LOW (ref 39–117)
BUN: 32 mg/dL — AB (ref 6–23)
CALCIUM: 9.8 mg/dL (ref 8.4–10.5)
CHLORIDE: 105 meq/L (ref 96–112)
CO2: 27 mEq/L (ref 19–32)
Creat: 1.43 mg/dL — ABNORMAL HIGH (ref 0.50–1.10)
Glucose, Bld: 91 mg/dL (ref 70–99)
Potassium: 3.8 mEq/L (ref 3.5–5.3)
Sodium: 142 mEq/L (ref 135–145)
Total Bilirubin: 0.6 mg/dL (ref 0.2–1.2)
Total Protein: 6.6 g/dL (ref 6.0–8.3)

## 2013-10-26 LAB — LIPID PANEL
CHOLESTEROL: 159 mg/dL (ref 0–200)
HDL: 56 mg/dL (ref >39)
LDL Cholesterol: 78 mg/dL (ref 0–99)
TRIGLYCERIDES: 125 mg/dL (ref <150)
Total CHOL/HDL Ratio: 2.8 Ratio
VLDL: 25 mg/dL (ref 0–40)

## 2013-10-26 MED ORDER — METOPROLOL TARTRATE 25 MG PO TABS: 75.0000 mg | ORAL_TABLET | Freq: Every day | ORAL | Status: DC

## 2013-10-26 MED ORDER — FENOFIBRATE 54 MG PO TABS
54.0000 mg | ORAL_TABLET | Freq: Every day | ORAL | Status: DC
Start: 2013-10-26 — End: 2014-06-16

## 2013-10-26 NOTE — Patient Instructions (Signed)
  HEALTH MAINTENANCE RECOMMENDATIONS:  It is recommended that you get at least 30 minutes of aerobic exercise at least 5 days/week (for weight loss, you may need as much as 60-90 minutes). This can be any activity that gets your heart rate up. This can be divided in 10-15 minute intervals if needed, but try and build up your endurance at least once a week.  Weight bearing exercise is also recommended twice weekly.  Eat a healthy diet with lots of vegetables, fruits and fiber.  "Colorful" foods have a lot of vitamins (ie green vegetables, tomatoes, red peppers, etc).  Limit sweet tea, regular sodas and alcoholic beverages, all of which has a lot of calories and sugar.  Up to 1 alcoholic drink daily may be beneficial for women (unless trying to lose weight, watch sugars).  Drink a lot of water.  Calcium recommendations are 1200-1500 mg daily (1500 mg for postmenopausal women or women without ovaries), and vitamin D 1000 IU daily.  This should be obtained from diet and/or supplements (vitamins), and calcium should not be taken all at once, but in divided doses.  Monthly self breast exams and yearly mammograms for women over the age of 78 is recommended.  Sunscreen of at least SPF 30 should be used on all sun-exposed parts of the skin when outside between the hours of 10 am and 4 pm (not just when at beach or pool, but even with exercise, golf, tennis, and yard work!)  Use a sunscreen that says "broad spectrum" so it covers both UVA and UVB rays, and make sure to reapply every 1-2 hours.  Remember to change the batteries in your smoke detectors when changing your clock times in the spring and fall.  Use your seat belt every time you are in a car, and please drive safely and not be distracted with cell phones and texting while driving.  Yearly high dose flu shots are recommended.  You are likely past due for Tdap (tetanus and whooping cough vaccine, combination).  The Prevnar-13 vaccine against  pneumococcal pneumonia is also recommended, as well as shingles vaccine.  If you have any change of mind, please call for nurse visit to get these vaccines. (shingles vaccine needs to be received at the pharmacy, and you need a prescription; insurance, if it covers it, likely will require it to be at the pharmacy)  Please consider getting bone density test (I believe this can be arranged to be [facility], also [facility] at the [facility]).  This test is non-invasive, and can help know if your bones are at risk for fracturing (spine, hip)

## 2013-10-26 NOTE — Progress Notes (Signed)
Chief Complaint  Patient presents with  . Med check plus    fasting med check plus.  No concerns today. Does not want to do pelvic exam or DEXA.    [name] is a 80 y.o. female who presents for annual wellness visit and med check.  She has no complaints today.  H/o gout in R hand--resolved. She hasn't had a recurrence in the last 6 months, or longer. Diverticulitis/diverticulosis--she occasionally has some burning on the left side, but no significant pain, blood in stools or other bowel problems.  Takes 3 fiber supplements daily, and has been doing well.    Hypertension follow-up:  Blood pressures elsewhere are reportly normal, "good", recalls 130/70.  Denies dizziness, headaches, chest pain.  Denies side effects of medications.  She has been taking lasix daily for many years.  On days she doesn't take it (due to travel) she has recurrent swelling in her feet.  Denies any shortness of breath.  Pacemaker--sees Dr. Salena Saner once yearly, but has it checked every 3 months over the phone, just recently checked.  Saw ENT and was told that she had prominent salivary glands--didn't require any further eval or treatment.  She denies any change, nontender.  Health Maintenance: There is no immunization history on file for this patient. "I haven't taken any vaccines in a long, long time"--she refuses them all.  Last Pap smear: s/p hysterectomy; last in 2011 Last mammogram: 09/2013 Last colonoscopy: she believes it was 2009 Last DEXA: many years ago Dentist:--doesn't go, has upper and lower dentures Ophtho: every 6 months Exercise:  Walks up and down her driveway (10-15 minutes, sometimes 2-3 times/day).  No weight-bearing exercise.  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  AWV: Other doctors caring for patient include: Cardiolologist:  Dr. Rubie Maidroituro Ophtho: Dr. Sharlot GowdaKoop GYN Dr. Henderson Cloudomblin (hasn't seen him in 4 years) GI:  Can't recall, last seen in 2009  Depression screening--negative.  See scanned  questionnaire ADL screening--negative (only notable for occasional hearing trouble).  See scanned questionnaire  She has a Technical brewerLiving Will and HealthCare of Power of Attorney  Past Medical History  Diagnosis Date  . Sick sinus syndrome with bradycardia  . Pacemaker     sees Dr. Rubie Maidroituro [facility] annually. Pacemaker replaced 2007 [facility]  . Urge incontinence   . Hypertension   . Hypertriglyceridemia   . Chest wall pain 2003    atypical, now resolved  . Diverticulitis     with DT flare in 6/09  . Macular degeneration     Dr. Sharlot GowdaKoop  . CKD (chronic kidney disease), stage III     Cr 1.27, GFR 40 (07/2011)  . Tobacco use disorder     quit 01/2013  . Gout     R hand    Past Surgical History  Procedure Laterality Date  . Pacemaker insertion  1994, 2000, 2007  . Cholecystectomy  1998    Dr. Gerrit FriendsGerkin  . Appendectomy  1952  . Tubal ligation  1960  . Abdominal hysterectomy  1978    partial  . Eye surgery  11/2004    cataract surgery b/l - Dr.Bergen  . Colon surgery  11/2009    vaginal-colon fistula repair (Drs. Delane GingerGerkin/Newman)  . Cardiac catheterization  2000  . Cystoscopy  2002    Dr. Wanda PlumpHumphries    History   Social History  . Marital Status: Divorced    Spouse Name: N/A    Number of Children: 4  . Years of Education: N/A  Occupational History  . retired Agricultural engineer    Social History Main Topics  . Smoking status: Former Smoker -- 0.30 packs/day for 40 years    Quit date: 01/12/2013  . Smokeless tobacco: Never Used     Comment: smokes 1 pack every 2-3 days  . Alcohol Use: No  . Drug Use: No  . Sexual Activity: Not on file   Other Topics Concern  . Not on file   Social History Narrative   Lives alone, 1 cat.  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    Family History  Problem Relation Age of Onset  . Diabetes Mother   . Heart disease Mother   . Hyperlipidemia Mother   . Hypertension Mother   . Heart disease Father      pacemaker, CABG  . Hyperlipidemia Father   . Hypertension Father   . Cancer Sister     breast cancer 61's, then recurrence, metastatic, died at 59  . Diabetes Brother   . Alcohol abuse Brother   . Heart disease Brother   . Crohn's disease Daughter   . Alzheimer's disease Brother 47  . Ulcerative colitis Daughter   . Heart disease Other 62  . Stroke Other 62  . Cancer Other     breast   Outpatient Encounter Prescriptions as of 10/26/2013  Medication Sig  . aspirin 81 MG tablet Take 81 mg by mouth daily.  . calcium carbonate (OS-CAL) 600 MG TABS Take 630 mg by mouth daily.   . fenofibrate 54 MG tablet Take 1 tablet (54 mg total) by mouth daily.  . furosemide (LASIX) 40 MG tablet Take 1 tablet (40 mg total) by mouth daily.  . metoprolol tartrate (LOPRESSOR) 25 MG tablet Take 3 tablets (75 mg total) by mouth daily. 50mg  q am and 25mg  q evening  . Multiple Vitamins-Minerals (MULTIVITAMIN WITH MINERALS) tablet Take 1 tablet by mouth daily.  . Multiple Vitamins-Minerals (OCUVITE PRESERVISION PO) Take 1 tablet by mouth daily.  . polycarbophil (FIBERCON) 625 MG tablet Take 1,875 mg by mouth daily.  . [DISCONTINUED] fenofibrate 54 MG tablet Take 1 tablet (54 mg total) by mouth daily.  . [DISCONTINUED] metoprolol tartrate (LOPRESSOR) 25 MG tablet Take 3 tablets (75 mg total) by mouth daily. 50mg  q am and 25mg  q evening    Allergies  Allergen Reactions  . Morphine And Related Nausea And Vomiting   ROS:  The patient denies anorexia, fever, weight changes, headaches,  vision changes, decreased hearing, ear pain, sore throat, breast concerns, chest pain, palpitations, dizziness, syncope, dyspnea on exertion, cough, swelling, nausea, vomiting, diarrhea, constipation, abdominal pain, melena, hematochezia, indigestion/heartburn, hematuria, dysuria, vaginal bleeding, discharge, odor or itch, genital lesions, joint pains, numbness, tingling, weakness, tremor, suspicious skin lesions, depression,  anxiety, abnormal bleeding/bruising, or enlarged lymph nodes. Urinary urgency, with occasional incontinence (wears a pad) Some bruising when she bumps into things, stable, unchanged, and no other bleeding.  PHYSICAL EXAM: BP 118/68  Pulse 68  Ht 5' 4.5" (1.638 m)  Wt 172 lb (78.019 kg)  BMI 29.08 kg/m2  General Appearance:    Alert, cooperative, no distress, appears stated age  Head:    Normocephalic, without obvious abnormality, atraumatic  Eyes:    PERRL, conjunctiva/corneas clear, EOM's intact, fundi    benign  Ears:    Normal TM's and external ear canals  Nose:   Nares normal, mucosa is moderately edematous, pale with clear mucus, no sinus tenderness  Throat:  Lips, mucosa, and tongue normal; edentulous with dentures  Neck:   Supple, no lymphadenopathy; prominent salivary glands;  Thyroid: no enlargement/tenderness/nodules; no carotid   bruit or JVD  Back:    Spine nontender, no curvature, ROM normal, no CVA     tenderness  Lungs:     Clear to auscultation bilaterally without wheezes, rales or     ronchi; respirations unlabored  Chest Wall:    No tenderness or deformity.  Pacemaker present in R upper chest wall   Heart:    Regular rate and rhythm, S1 and S2 normal, no murmur, rub   or gallop  Breast Exam:    No tenderness, masses, or nipple discharge or inversion.      No axillary lymphadenopathy  Abdomen:     Soft, non-tender, nondistended, normoactive bowel sounds,    no masses, no hepatosplenomegaly  Genitalia:    Declined by patient  Rectal:    Declined by patient  Extremities:   No clubbing, cyanosis or edema  Pulses:   2+ and symmetric all extremities  Skin:   Skin color, texture, turgor normal, no rashes or lesions. Scattered angiomas and SK's  Lymph nodes:   Cervical, supraclavicular, and axillary nodes normal  Neurologic:   CNII-XII intact, normal strength, sensation and gait; reflexes 2+ and symmetric throughout          Psych:   Normal mood, affect, hygiene and  grooming.    ASSESSMENT/PLAN:  Medicare annual wellness visit, initial  Pure hyperglyceridemia - Plan: Lipid panel, Comprehensive metabolic panel, fenofibrate 54 MG tablet  Essential hypertension, benign - controlled - Plan: Comprehensive metabolic panel, metoprolol tartrate (LOPRESSOR) 25 MG tablet  Renal insufficiency - Plan: Comprehensive metabolic panel  Counseled re: Tdap, pneumococcal vaccines (?if she ever had pneumovax, if so, still recommend Prevnar-13 now), yearly flu shots and zostavax.  She declines all vaccines.  Risks/benefits, reasons these are recommended was reviewed in detail.  DEXA also recommended (risks/benefits) and she will consider [facility] Pelvic exam is recommended--refused today.   Colonoscopy is UTD.  Discussed monthly self breast exams and yearly mammograms; at least 30 minutes of aerobic activity at least 5 days/week; proper sunscreen use reviewed; healthy diet, including goals of calcium and vitamin D intake and alcohol recommendations (less than or equal to 1 drink/day) reviewed; regular seatbelt use; changing batteries in smoke detectors.  Immunization recommendations discussed as above.  Colonoscopy recommendations reviewed, UTD

## 2013-11-10 ENCOUNTER — Ambulatory Visit (INDEPENDENT_AMBULATORY_CARE_PROVIDER_SITE_OTHER): Payer: Medicare HMO | Admitting: *Deleted

## 2013-11-10 ENCOUNTER — Telehealth: Payer: Self-pay | Admitting: Cardiology

## 2013-11-10 ENCOUNTER — Encounter: Payer: Self-pay | Admitting: Cardiovascular Disease

## 2013-11-10 DIAGNOSIS — I495 Sick sinus syndrome: Secondary | ICD-10-CM

## 2013-11-10 LAB — MDC_IDC_ENUM_SESS_TYPE_REMOTE
Battery Impedance: 824 Ohm
Battery Remaining Longevity: 52 mo
Battery Voltage: 2.77 V
Brady Statistic AP VP Percent: 3 %
Brady Statistic AP VS Percent: 95 %
Brady Statistic AS VP Percent: 0 %
Lead Channel Impedance Value: 419 Ohm
Lead Channel Impedance Value: 447 Ohm
Lead Channel Pacing Threshold Pulse Width: 0.4 ms
Lead Channel Setting Pacing Amplitude: 1.5 V
Lead Channel Setting Pacing Amplitude: 3 V
Lead Channel Setting Pacing Pulse Width: 0.4 ms
Lead Channel Setting Sensing Sensitivity: 2 mV
MDC IDC MSMT LEADCHNL RA PACING THRESHOLD AMPLITUDE: 0.625 V
MDC IDC MSMT LEADCHNL RV PACING THRESHOLD AMPLITUDE: 1.5 V
MDC IDC MSMT LEADCHNL RV PACING THRESHOLD PULSEWIDTH: 0.4 ms
MDC IDC SESS DTM: 20150630151747
MDC IDC STAT BRADY AS VS PERCENT: 2 %

## 2013-11-10 NOTE — Telephone Encounter (Signed)
Spoke with pt and reminded pt of remote transmission that is due today. Pt verbalized understanding.   

## 2013-11-10 NOTE — Progress Notes (Signed)
Remote pacemaker transmission.   

## 2013-11-11 ENCOUNTER — Telehealth: Payer: Self-pay | Admitting: Cardiovascular Disease

## 2013-11-11 NOTE — Telephone Encounter (Signed)
New Message:  Pt is calling to see if her remote transmission went through.

## 2013-11-11 NOTE — Telephone Encounter (Signed)
Informed pt the transmission was received on 11-10-13. Pt verbalized understanding.

## 2013-12-01 ENCOUNTER — Encounter: Payer: Self-pay | Admitting: Cardiology

## 2014-02-11 ENCOUNTER — Telehealth: Payer: Self-pay | Admitting: Cardiovascular Disease

## 2014-02-11 ENCOUNTER — Encounter: Payer: Medicare HMO | Admitting: *Deleted

## 2014-02-11 ENCOUNTER — Telehealth: Payer: Self-pay | Admitting: Cardiology

## 2014-02-11 NOTE — Telephone Encounter (Signed)
New message     Pt is trying to send her remote device check but is having problems.  Please call
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
## 2014-02-11 NOTE — Telephone Encounter (Signed)
Spoke with pt and reminded pt of remote transmission that is due today. Pt verbalized understanding.   

## 2014-02-11 NOTE — Telephone Encounter (Signed)
LMOVM for pt to return call 

## 2014-02-12 ENCOUNTER — Encounter: Payer: Self-pay | Admitting: Cardiology

## 2014-02-16 ENCOUNTER — Encounter: Payer: Self-pay | Admitting: *Deleted

## 2014-02-17 ENCOUNTER — Encounter: Payer: Self-pay | Admitting: Cardiovascular Disease

## 2014-02-17 ENCOUNTER — Ambulatory Visit (INDEPENDENT_AMBULATORY_CARE_PROVIDER_SITE_OTHER): Payer: Medicare HMO | Admitting: Cardiovascular Disease

## 2014-02-17 VITALS — BP 118/68 | HR 60 | Resp 16 | Ht 64.5 in | Wt 173.0 lb

## 2014-02-17 DIAGNOSIS — I10 Essential (primary) hypertension: Secondary | ICD-10-CM

## 2014-02-17 DIAGNOSIS — E781 Pure hyperglyceridemia: Secondary | ICD-10-CM

## 2014-02-17 DIAGNOSIS — Z950 Presence of cardiac pacemaker: Secondary | ICD-10-CM

## 2014-02-17 LAB — MDC_IDC_ENUM_SESS_TYPE_INCLINIC
Battery Impedance: 902 Ohm
Battery Remaining Longevity: 4
Battery Voltage: 2.77 V
Brady Statistic AS VP Percent: 0.2 %
Lead Channel Impedance Value: 419 Ohm
Lead Channel Impedance Value: 455 Ohm
Lead Channel Pacing Threshold Amplitude: 1 V
Lead Channel Sensing Intrinsic Amplitude: 4 mV
Lead Channel Setting Pacing Amplitude: 1.5 V
Lead Channel Setting Sensing Sensitivity: 2 mV
MDC IDC MSMT LEADCHNL RA PACING THRESHOLD AMPLITUDE: 0.75 V
MDC IDC MSMT LEADCHNL RA PACING THRESHOLD PULSEWIDTH: 0.4 ms
MDC IDC MSMT LEADCHNL RV PACING THRESHOLD PULSEWIDTH: 0.4 ms
MDC IDC SET LEADCHNL RV PACING AMPLITUDE: 3 V
MDC IDC SET LEADCHNL RV PACING PULSEWIDTH: 0.4 ms
MDC IDC STAT BRADY AP VP PERCENT: 2.8 %
MDC IDC STAT BRADY AP VS PERCENT: 95.4 %
MDC IDC STAT BRADY AS VS PERCENT: 1.7 %

## 2014-02-17 NOTE — Patient Instructions (Signed)
Remote monitoring is used to monitor your Pacemaker or ICD from home. This monitoring reduces the number of office visits required to check your device to one time per year. It allows us to monitor the functioning of your device to ensure it is working properly. You are scheduled for a device check from home on May 21, 2014. You may send your transmission at any time that day. If you have a wireless device, the transmission will be sent automatically. After your physician reviews your transmission, you will receive a postcard with your next transmission date.  Dr. Royann Shiversroitoru recommends that you schedule a follow-up appointment in: 6 months.

## 2014-02-18 NOTE — Progress Notes (Signed)
Patient ID: [name], female   DOB: 1927/07/09, 80 y.o.   MRN: [id number]     Reason for office visit Pacemaker followup, hypertension, hypertriglyceridemia  [name] has sinus node dysfunction which has progressed to atrial standstill. She has normal atrioventricular conduction. She is pacemaker dependent (atrial). She initially had a pacemaker implanted in 1994 and has subsequently had 2 generator change out procedures, most recently in 2007. She still has the original unipolar atrial and ventricular leads from 1994.  She does not have problems with syncope or palpitations. Her pacemaker is still functioning normally but sensing on the ventricular channel has gradually deteriorated and now R-wave amplitude is about 2 mV. In addition there is evidence of recorded noise especially on the atrial channel, less prominently on the ventricular channel. We were able to reproduce the artifact using isometric exercises in the office today.  So far these lead deficiencies have not led to serious interference with normal device function. The current generator has an estimated longevity of about another 4 years. She has virtually 100% atrial pacing and only 3% ventricular pacing. Several episodes of atrial mode switch have been recorded many of which are likely due to "noise"   Allergies  Allergen Reactions  . Morphine And Related Nausea And Vomiting    Current Outpatient Prescriptions  Medication Sig Dispense Refill  . aspirin 81 MG tablet Take 81 mg by mouth daily.      . calcium carbonate (OS-CAL) 600 MG TABS Take 630 mg by mouth daily.       . fenofibrate 54 MG tablet Take 1 tablet (54 mg total) by mouth daily.  90 tablet  1  . furosemide (LASIX) 40 MG tablet Take 1 tablet (40 mg total) by mouth daily.  90 tablet  3  . metoprolol tartrate (LOPRESSOR) 25 MG tablet Take 3 tablets (75 mg total) by mouth daily. 52m q am and 242mq evening  270 tablet  1  . Multiple Vitamins-Minerals  (MULTIVITAMIN WITH MINERALS) tablet Take 1 tablet by mouth daily.      . Multiple Vitamins-Minerals (OCUVITE PRESERVISION PO) Take 1 tablet by mouth daily.      . polycarbophil (FIBERCON) 625 MG tablet Take 1,875 mg by mouth daily.       No current facility-administered medications for this visit.    Past Medical History  Diagnosis Date  . Sick sinus syndrome with bradycardia  . Pacemaker     [facility] ([facility] . Urge incontinence   . Hypertension   . Hypertriglyceridemia   . Chest wall pain 2003    atypical, now resolved  . Diverticulitis     with DT flare in 6/09  . Macular degeneration     Dr. KoPeter Garter. CKD (chronic kidney disease), stage III     Cr 1.27, GFR 40 (07/2011)  . Tobacco use disorder     quit 01/2013  . Gout     R hand    Past Surgical History  Procedure Laterality Date  . Pacemaker insertion  1994, 2000, 2007  . Cholecystectomy  1998    Dr. GeHarlow Asa. Appendectomy  1952  . Tubal ligation  1960  . Abdominal hysterectomy  1978    partial  . Eye surgery  11/2004    cataract surgery b/l - Dr.Bergen  . Colon surgery  11/2009    vaginal-colon fistula repair (Drs. GeDanne Baxter . Cardiac catheterization  2000  . Cystoscopy  2002    Dr. Reece Agar    Family History  Problem Relation Age of Onset  . Diabetes Mother   . Heart disease Mother   . Hyperlipidemia Mother   . Hypertension Mother   . Heart disease Father     pacemaker, CABG  . Hyperlipidemia Father   . Hypertension Father   . Cancer Sister     breast cancer 5's, then recurrence, metastatic, died at 58  . Diabetes Brother   . Alcohol abuse Brother   . Heart disease Brother   . Crohn's disease Daughter   . Alzheimer's disease Brother 35  . Ulcerative colitis Daughter   . Heart disease Other 62  . Stroke Other 62  . Cancer Other     breast    History   Social History  . Marital Status: Divorced    Spouse Name: N/A    Number of  Children: 107  . Years of Education: N/A   Occupational History  . retired Psychologist, counselling    Social History Main Topics  . Smoking status: Former Smoker -- 0.30 packs/day for 40 years    Quit date: 01/12/2013  . Smokeless tobacco: Never Used     Comment: smokes 1 pack every 2-3 days  . Alcohol Use: No  . Drug Use: No  . Sexual Activity: Not on file   Other Topics Concern  . Not on file   Social History Narrative   Lives alone, 1 cat.  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    Review of systems: The patient specifically denies any chest pain at rest or with exertion, dyspnea at rest or with exertion, orthopnea, paroxysmal nocturnal dyspnea, syncope, palpitations, focal neurological deficits, intermittent claudication, lower extremity edema, unexplained weight gain, cough, hemoptysis or wheezing.  The patient also denies abdominal pain, nausea, vomiting, dysphagia, diarrhea, constipation, polyuria, polydipsia, dysuria, hematuria, frequency, urgency, abnormal bleeding or bruising, fever, chills, unexpected weight changes, mood swings, change in skin or hair texture, change in voice quality, auditory or visual problems, allergic reactions or rashes, new musculoskeletal complaints other than usual "aches and pains".   PHYSICAL EXAM BP 118/68  Pulse 60  Ht 5' 4.5" (1.638 m)  Wt 78.472 kg (173 lb)  BMI 29.25 kg/m2 General: Alert, oriented x3, no distress  Head: no evidence of trauma, PERRL, EOMI, no exophtalmos or lid lag, no myxedema, no xanthelasma; normal ears, nose and oropharynx  Slightly asymmetrical masses are seen in the submandibular area bilaterally, roughly 2.5 cm on the left and 1.5 cm on the right. These are mobile to the deep planes into the skin and have a rubbery texture. Are not painful or tender. There is no overlying erythema. Note that she is edentulous. There are no obvious oral ulcers or masses  Neck: normal jugular venous  pulsations and no hepatojugular reflux; brisk carotid pulses without delay and no carotid bruits  Chest: clear to auscultation, no signs of consolidation by percussion or palpation, normal fremitus, symmetrical and full respiratory excursions; subclavian pacemaker site appears healthy  Cardiovascular: normal position and quality of the apical impulse, regular rhythm, normal first and second heart sounds, no murmurs, rubs or gallops  Abdomen: no tenderness or distention, no masses by palpation, no abnormal pulsatility or arterial bruits, normal bowel sounds, no hepatosplenomegaly  Extremities: no clubbing, cyanosis or edema; 2+ radial, ulnar and brachial pulses bilaterally; 2+ right femoral, posterior tibial and dorsalis pedis pulses; 2+ left femoral, posterior tibial and  dorsalis pedis pulses; no subclavian or femoral bruits  Neurological: grossly nonfocal   EKG: Atrial paced ventricular sensed   Lipid Panel     Component Value Date/Time   CHOL 159 10/26/2013 1122   TRIG 125 10/26/2013 1122   HDL 56 10/26/2013 1122   CHOLHDL 2.8 10/26/2013 1122   VLDL 25 10/26/2013 1122   LDLCALC 78 10/26/2013 1122    BMET    Component Value Date/Time   NA 142 10/26/2013 1122   K 3.8 10/26/2013 1122   CL 105 10/26/2013 1122   CO2 27 10/26/2013 1122   GLUCOSE 91 10/26/2013 1122   BUN 32* 10/26/2013 1122   CREATININE 1.43* 10/26/2013 1122   CREATININE 0.95 12/16/2009 0459   CALCIUM 9.8 10/26/2013 1122   GFRNONAA 57* 12/16/2009 0459   GFRAA  Value: >60        The eGFR has been calculated using the MDRD equation. This calculation has not been validated in all clinical situations. eGFR's persistently <60 mL/min signify possible Chronic Kidney Disease. 12/16/2009 0459     ASSESSMENT AND PLAN Pacemaker - dual chamber, Medtronic 2007, unipolar leads 1994  She is pacemaker dependent (atrial standstill), but has normal atrioventricular conduction. 100% atrial pacing, only 3% ventricular pacing.  There are some issues with  noise recorded on both atrial and ventricular channels, although this is more obvious on the atrial channel. It can be reproduced with isometric upper torso exercises. So far has not interfered with device function in a serious manner. Unfortunately ventricular sensing is now mediocre, with R waves of only 2 mV. Since ventricular pacing has not really been necessary, no aggressive intervention is necessary at this time. We'll continue monitoring. When it comes time for her next generator change (anticipated and roughly 4 years) she should probably receive new modern bipolar leads.  Because of the noise recordings, atrial lead sensitivity was adjusted to 0.75 mV. Unfortunately there is no room to adjust ventricular lead sensitivity further due to the small R waves. She'll continue with remote [facility] monitoring every 3 months  Tobacco use disorder  She is congratulated on successfully maintaining smoking cessation  Essential hypertension, benign  Her blood pressure is borderline high today, but typically her blood pressure is substantially lower. No adjustments are made for medications to  Pure hyperglyceridemia  Excellent triglyceride level  Orders Placed This Encounter  Procedures  . EKG 12-Lead   No orders of the defined types were placed in this encounter.    Holli Humbles, MD, [place] [phone] office [phone] pager

## 2014-02-25 ENCOUNTER — Other Ambulatory Visit: Payer: Self-pay | Admitting: Cardiovascular Disease

## 2014-04-13 ENCOUNTER — Other Ambulatory Visit: Payer: Self-pay | Admitting: Cardiovascular Disease

## 2014-04-14 NOTE — Telephone Encounter (Signed)
Rx refill sent to patient pharmacy   

## 2014-05-21 ENCOUNTER — Ambulatory Visit (INDEPENDENT_AMBULATORY_CARE_PROVIDER_SITE_OTHER): Payer: Medicare HMO | Admitting: *Deleted

## 2014-05-21 DIAGNOSIS — I495 Sick sinus syndrome: Secondary | ICD-10-CM

## 2014-05-21 LAB — MDC_IDC_ENUM_SESS_TYPE_REMOTE
Battery Impedance: 1007 Ohm
Battery Remaining Longevity: 45 mo
Battery Voltage: 2.76 V
Brady Statistic AP VS Percent: 91 %
Brady Statistic AS VP Percent: 1 %
Date Time Interrogation Session: 20160108135006
Lead Channel Impedance Value: 389 Ohm
Lead Channel Impedance Value: 436 Ohm
Lead Channel Pacing Threshold Amplitude: 0.625 V
Lead Channel Pacing Threshold Pulse Width: 0.4 ms
Lead Channel Setting Pacing Amplitude: 2.5 V
Lead Channel Setting Pacing Pulse Width: 0.4 ms
MDC IDC MSMT LEADCHNL RV PACING THRESHOLD AMPLITUDE: 1.25 V
MDC IDC MSMT LEADCHNL RV PACING THRESHOLD PULSEWIDTH: 0.4 ms
MDC IDC SET LEADCHNL RA PACING AMPLITUDE: 1.5 V
MDC IDC SET LEADCHNL RV SENSING SENSITIVITY: 2 mV
MDC IDC STAT BRADY AP VP PERCENT: 5 %
MDC IDC STAT BRADY AS VS PERCENT: 3 %

## 2014-05-21 NOTE — Progress Notes (Signed)
Remote pacemaker transmission.   

## 2014-06-10 ENCOUNTER — Encounter: Payer: Self-pay | Admitting: Cardiology

## 2014-06-16 ENCOUNTER — Other Ambulatory Visit: Payer: Self-pay | Admitting: Family Medicine

## 2014-06-16 ENCOUNTER — Encounter: Payer: Self-pay | Admitting: Cardiovascular Disease

## 2014-07-07 ENCOUNTER — Encounter: Payer: Self-pay | Admitting: Family Medicine

## 2014-07-07 ENCOUNTER — Ambulatory Visit (INDEPENDENT_AMBULATORY_CARE_PROVIDER_SITE_OTHER): Payer: Medicare HMO | Admitting: Family Medicine

## 2014-07-07 VITALS — BP 110/70 | HR 72 | Ht 64.5 in | Wt 169.0 lb

## 2014-07-07 DIAGNOSIS — E781 Pure hyperglyceridemia: Secondary | ICD-10-CM

## 2014-07-07 DIAGNOSIS — N289 Disorder of kidney and ureter, unspecified: Secondary | ICD-10-CM

## 2014-07-07 DIAGNOSIS — Z5181 Encounter for therapeutic drug level monitoring: Secondary | ICD-10-CM

## 2014-07-07 DIAGNOSIS — I10 Essential (primary) hypertension: Secondary | ICD-10-CM

## 2014-07-07 LAB — LIPID PANEL
CHOL/HDL RATIO: 2.8 ratio
CHOLESTEROL: 142 mg/dL (ref 0–200)
HDL: 50 mg/dL (ref >=46)
LDL Cholesterol: 60 mg/dL (ref 0–99)
TRIGLYCERIDES: 160 mg/dL — AB (ref <150)
VLDL: 32 mg/dL (ref 0–40)

## 2014-07-07 LAB — CBC WITH DIFFERENTIAL/PLATELET
BASOS PCT: 0 % (ref 0–1)
Basophils Absolute: 0 10*3/uL (ref 0.0–0.1)
EOS ABS: 0.1 10*3/uL (ref 0.0–0.7)
Eosinophils Relative: 2 % (ref 0–5)
HCT: 40.9 % (ref 36.0–46.0)
Hemoglobin: 13.2 g/dL (ref 12.0–15.0)
Lymphocytes Relative: 30 % (ref 12–46)
Lymphs Abs: 2.2 10*3/uL (ref 0.7–4.0)
MCH: 30.3 pg (ref 26.0–34.0)
MCHC: 32.3 g/dL (ref 30.0–36.0)
MCV: 93.8 fL (ref 78.0–100.0)
MONO ABS: 0.7 10*3/uL (ref 0.1–1.0)
MONOS PCT: 9 % (ref 3–12)
MPV: 11.7 fL (ref 8.6–12.4)
NEUTROS PCT: 59 % (ref 43–77)
Neutro Abs: 4.4 10*3/uL (ref 1.7–7.7)
Platelets: 237 10*3/uL (ref 150–400)
RBC: 4.36 MIL/uL (ref 3.87–5.11)
RDW: 13.6 % (ref 11.5–15.5)
WBC: 7.4 10*3/uL (ref 4.0–10.5)

## 2014-07-07 LAB — COMPREHENSIVE METABOLIC PANEL
ALT: <8 U/L (ref 0–35)
AST: 26 U/L (ref 0–37)
Albumin: 4.2 g/dL (ref 3.5–5.2)
Alkaline Phosphatase: 43 U/L (ref 39–117)
BILIRUBIN TOTAL: 0.6 mg/dL (ref 0.2–1.2)
BUN: 25 mg/dL — AB (ref 6–23)
CO2: 26 mEq/L (ref 19–32)
Calcium: 10.1 mg/dL (ref 8.4–10.5)
Chloride: 103 mEq/L (ref 96–112)
Creat: 1.39 mg/dL — ABNORMAL HIGH (ref 0.50–1.10)
Glucose, Bld: 86 mg/dL (ref 70–99)
Potassium: 4.3 mEq/L (ref 3.5–5.3)
Sodium: 140 mEq/L (ref 135–145)
Total Protein: 7 g/dL (ref 6.0–8.3)

## 2014-07-07 LAB — TSH: TSH: 2.229 u[IU]/mL (ref 0.350–4.500)

## 2014-07-07 MED ORDER — METOPROLOL TARTRATE 25 MG PO TABS: 75.0000 mg | ORAL_TABLET | Freq: Every day | ORAL | Status: DC

## 2014-07-07 NOTE — Patient Instructions (Signed)
  Your blood pressure is excellent. We are rechecking your cholesterol panel and kidney and liver tests today.  The kidney tests have been a little high in the past.  It is important to make sure you drink plenty of water (given the diuretics that you take).   Use tylenol if needed for pain, and try and avoid anti-inflammatories such as ibuprofen or aleve.  I will hold off on refilling/changing your triglyceride medication until I see your test results from today. I don't see a reason we can't change to gembribrozil.  That is usually dosed 600mg  twice daily, which is actually much stronger than what you are currently taking.  If your triglycerides remain less than 150 today, on the low dose medication you are currently taking, then we can change to the less expensive medication, but lower the dose by taking it just once daily rather than twice daily (as it is usually prescribed).  We will need to recheck your liver tests and lipid panel in 2-3 months after changing the medication, to ensure that it is working adequately and not causing harm.  This can be a lab only visit.

## 2014-07-07 NOTE — Progress Notes (Signed)
Chief Complaint  Patient presents with  . Hypertension    fasting med check. Also would like to know if you can change her fenofibrate to gemfibrozil as it is cheaper.    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 She is very happy, and has no complaints or problems.  Hypertension follow-up: Blood pressures are not checked elsewhere. Denies dizziness, headaches, chest pain. Denies side effects of medications. She has been taking lasix daily for many years. On days she doesn't take it (due to travel) she has recurrent swelling in her feet. Denies any shortness of breath, muscle cramps.  Hypertriglyceridemia:  She is taking fenofibrate  without side effects.  Last TG check was 10/2013 and was at goal (TG 125).  She is asking to change to gemfibrozil for cost purposes (change from $15/month to $2-3/mo)  Pacemaker--she last saw cardiologist in October, and pacemaker was checked last month. No complications or symptoms, but they are keeping an eye on the leads.  Due back for follow-up in April.  H/o gout in R hand--resolved. She hasn't had a recurrence in over a year.  Diverticulitis/diverticulosis--she occasionally has some burning on the left side, but no significant pain, blood in stools or other bowel problems. Takes 3 fiber supplements daily, and has been doing well on this regimen.   Renal insufficiency.  Due for recheck of chem panel.  PMH, PSH, SH were reviewed and updated.  Outpatient Encounter Prescriptions as of 07/07/2014  Medication Sig  . aspirin 81 MG tablet Take 81 mg by mouth daily.  . calcium carbonate (OS-CAL) 600 MG TABS Take 630 mg by mouth daily.   . fenofibrate 54 MG tablet TAKE ONE (1) TABLET BY MOUTH EVERY DAY  . furosemide (LASIX) 40 MG tablet TAKE ONE (1) TABLET BY MOUTH EVERY DAY  . metoprolol tartrate (LOPRESSOR) 25 MG tablet Take 3 tablets (75 mg total) by mouth daily.  q am and  q evening  . Multiple Vitamins-Minerals  (MULTIVITAMIN WITH MINERALS) tablet Take 1 tablet by mouth daily.  . Multiple Vitamins-Minerals (OCUVITE PRESERVISION PO) Take 1 tablet by mouth daily.  . polycarbophil (FIBERCON) 625 MG tablet Take 1,875 mg by mouth daily.   ROS: Denies fevers, chills, headaches, dizziness, chest pain, palpitations, shortness of breath, syncope, dizziness,  nausea, vomiting, heartburn, abdominal pain, changes in bowels. Denies URI symptoms, sore throat, cough.  She has a lost a few pounds, back to her former weight. She reports still having some prominent glands in her neck, but that she saw ENT and nothing has changed. See HPI.  PHYSICAL EXAM: BP 110/70 mmHg  Pulse 72  Ht 5' 4.5" (1.638 m)  Wt 169 lb (76.658 kg)  BMI 28.57 kg/m2 Very pleasant female, appearing her stated age or younger, in good spirits. HEENT: Conjunctiva clear. OP clear  Neck: no carotid bruit or lymphadenopathy.  Prominent submandibular glands.  No thyromegaly. Heart: regular rate and rhythm without murmur  Lungs: sounds are clear bilaterally. No wheezes, rales, ronchi  Abdomen: soft, nontender, active bowel sounds, no mass or organomegaly Extremities: no edema, palpable pulses. Skin: no rash or lesions Psych: normal mood, affect, hygiene and grooming.  Neuro: alert and oriented. Normal gait, cranial nerves grossly intact  ASSESSMENT/PLAN:  Pure hyperglyceridemia - due for labs. Pt wants to change to lopid for cost purposes.  Likely can take just daily rather than BID, given low dose of fenofibrate. Await results to decide - Plan: Lipid panel, Comprehensive metabolic panel  Essential hypertension, benign - controlled - Plan: Comprehensive metabolic panel, metoprolol tartrate (LOPRESSOR) 25 MG tablet  Renal insufficiency - Plan: Comprehensive metabolic panel  Medication monitoring encounter - Plan: Lipid panel, Comprehensive metabolic panel, CBC with Differential/Platelet, TSH  Essential hypertension, benign - controlled -  Plan: Comprehensive metabolic panel, metoprolol tartrate (LOPRESSOR) 25 MG tablet   Again discussed recommended vaccines;  She continues to decline.   Your blood pressure is excellent. We are rechecking your cholesterol panel and kidney and liver tests today.  The kidney tests have been a little high in the past.  It is important to make sure you drink plenty of water (given the diuretics that you take).   Use tylenol if needed for pain, and try and avoid anti-inflammatories such as ibuprofen or aleve.  I will hold off on refilling/changing your triglyceride medication until I see your test results from today. I don't see a reason we can't change to gembribrozil.  That is usually dosed 600mg  twice daily, which is actually much stronger than what you are currently taking.  If your triglycerides remain less than 150 today, on the low dose medication you are currently taking, then we can change to the less expensive medication, but lower the dose by taking it just once daily rather than twice daily (as it is usually prescribed).  We will need to recheck your liver tests and lipid panel in 2-3 months after changing the medication, to ensure that it is working adequately and not causing harm.  This can be a lab only visit.

## 2014-07-08 MED ORDER — GEMFIBROZIL 600 MG PO TABS: 600.0000 mg | ORAL_TABLET | Freq: Every day | ORAL | Status: DC

## 2014-08-31 ENCOUNTER — Encounter: Payer: Self-pay | Admitting: Cardiovascular Disease

## 2014-08-31 ENCOUNTER — Ambulatory Visit (INDEPENDENT_AMBULATORY_CARE_PROVIDER_SITE_OTHER): Payer: Medicare HMO | Admitting: Cardiovascular Disease

## 2014-08-31 VITALS — BP 124/71 | HR 69 | Ht 64.0 in | Wt 168.6 lb

## 2014-08-31 DIAGNOSIS — I10 Essential (primary) hypertension: Secondary | ICD-10-CM | POA: Diagnosis not present

## 2014-08-31 DIAGNOSIS — I495 Sick sinus syndrome: Secondary | ICD-10-CM | POA: Diagnosis not present

## 2014-08-31 DIAGNOSIS — Z950 Presence of cardiac pacemaker: Secondary | ICD-10-CM | POA: Diagnosis not present

## 2014-08-31 LAB — MDC_IDC_ENUM_SESS_TYPE_INCLINIC
Battery Remaining Longevity: 3.5
Battery Voltage: 2.76 V
Brady Statistic AP VP Percent: 3.6 %
Brady Statistic AP VS Percent: 92.8 %
Brady Statistic AS VS Percent: 3.2 %
Lead Channel Impedance Value: 447 Ohm
Lead Channel Pacing Threshold Amplitude: 0.875 V
Lead Channel Pacing Threshold Pulse Width: 0.4 ms
Lead Channel Pacing Threshold Pulse Width: 0.4 ms
Lead Channel Setting Pacing Amplitude: 1.5 V
Lead Channel Setting Pacing Amplitude: 2.5 V
Lead Channel Setting Pacing Pulse Width: 0.4 ms
Lead Channel Setting Sensing Sensitivity: 2 mV
MDC IDC MSMT BATTERY IMPEDANCE: 1140 Ohm
MDC IDC MSMT LEADCHNL RA IMPEDANCE VALUE: 399 Ohm
MDC IDC MSMT LEADCHNL RA PACING THRESHOLD AMPLITUDE: 0.625 V
MDC IDC STAT BRADY AS VP PERCENT: 0.3 %

## 2014-08-31 NOTE — Progress Notes (Signed)
Patient ID: [name], female   DOB: 12-20-1927, 81 y.o.   MRN: [id number]     Cardiology Office Note   Date:  08/31/2014   ID:  [name], DOB Jun 10, 1927, MRN [id number]  PCP:  Lavonda Jumbo, MD  Cardiologist:   Thurmon Fair, MD   Chief Complaint  Patient presents with  . Follow-up    increase dyspnea over past 2-3 weeks. has had a couple presyncope as well       History of Present Illness: [name] is a 81 y.o. female who presents for pacemaker follow-up. She continues to live independently and take care of all of her own housework. She has noticed that she now has to stop and rest after vacuuming the living room and occasionally feels dizzy when she overexerts herself she does not have any other cardiovascular complaints.  Interrogation of her dual-chamber Medtronic Adapta device implanted in 2007 for sinus node dysfunction shows an estimated generator longevity of 3.5 years. Her atrial and ventricular leads are old unipolar leads implanted in 1994. Occasional "noise" is seen especially on the atrial lead, possibly with occasional pacing inhibition. Virtually no noise is seen on the ventricular lead. The lead noise is reproducible with pocket manipulation and isometric exercises. The device has recorded occasional episodes of true atrial tachycardia lasting for a maximum of 1 minute. The episodes of atrial rates over 400 bpm all represent noise. There is 96% atrial pacing and only 4% ventricular pacing. The sensed R waves remain rather mediocre at around 2 mV.  Her electrocardiogram shows atrial paced rhythm with native AV conduction and a narrow normal QRS. Her weight is identical to that recorded in February connection substantially lower than last fall. She does not have edema.   Past Medical History  Diagnosis Date  . Sick sinus syndrome with bradycardia  . Pacemaker     sees Dr. Rubie Maid [facility]) annually. Pacemaker replaced 2007 [facility])  . Urge incontinence   .  Hypertension   . Hypertriglyceridemia   . Chest wall pain 2003    atypical, now resolved  . Diverticulitis     with DT flare in 6/09  . Macular degeneration     Dr. Sharlot Gowda  . CKD (chronic kidney disease), stage III     Cr 1.27, GFR 40 (07/2011)  . Tobacco use disorder     quit 01/2013  . Gout     R hand    Past Surgical History  Procedure Laterality Date  . Pacemaker insertion  1994, 2000, 2007  . Cholecystectomy  1998    Dr. Gerrit Friends  . Appendectomy  1952  . Tubal ligation  1960  . Abdominal hysterectomy  1978    partial  . Eye surgery  11/2004    cataract surgery b/l - Dr.Bergen  . Colon surgery  11/2009    vaginal-colon fistula repair (Drs. Delane Ginger)  . Cardiac catheterization  2000  . Cystoscopy  2002    Dr. Wanda Plump     Current Outpatient Prescriptions  Medication Sig Dispense Refill  . aspirin 81 MG tablet Take 81 mg by mouth daily.    . calcium carbonate (OS-CAL) 600 MG TABS Take 630 mg by mouth daily.     . furosemide (LASIX) 40 MG tablet TAKE ONE (1) TABLET BY MOUTH EVERY DAY 90 tablet 2  . gemfibrozil (LOPID) 600 MG tablet Take 1 tablet (600 mg total) by mouth daily before breakfast. 90 tablet 0  . metoprolol tartrate (LOPRESSOR) 25 MG tablet  Take 3 tablets (75 mg total) by mouth daily. 50mg  q am and 25mg  q evening 270 tablet 1  . Multiple Vitamins-Minerals (MULTIVITAMIN WITH MINERALS) tablet Take 1 tablet by mouth daily.    . Multiple Vitamins-Minerals (OCUVITE PRESERVISION PO) Take 1 tablet by mouth daily.    . polycarbophil (FIBERCON) 625 MG tablet Take 1,875 mg by mouth daily.     No current facility-administered medications for this visit.    Allergies:   Morphine and related    Social History:  The patient  reports that she quit smoking about 19 months ago. She has never used smokeless tobacco. She reports that she does not drink alcohol or use illicit drugs.   Family History:  The patient's family history includes Alcohol abuse in her brother;  Alzheimer's disease (age of onset: 6970) in her brother; Cancer in her other and sister; Crohn's disease in her daughter; Diabetes in her brother and mother; Heart disease in her brother, father, and mother; Heart disease (age of onset: 7062) in her other; Hyperlipidemia in her father and mother; Hypertension in her father and mother; Stroke (age of onset: 8362) in her other; Ulcerative colitis in her daughter.    ROS:  Please see the history of present illness.   .   All other systems are reviewed and negative.    PHYSICAL EXAM: VS:  BP 124/71 mmHg  Pulse 69  Ht 5\' 4"  (1.626 m)  Wt 168 lb 9.6 oz (76.476 kg)  BMI 28.93 kg/m2 , BMI Body mass index is 28.93 kg/(m^2).  General: Alert, oriented x3, no distress  Head: no evidence of trauma, PERRL, EOMI, no exophtalmos or lid lag, no myxedema, no xanthelasma; normal ears, nose and oropharynx  Slightly asymmetrical masses are seen in the submandibular area bilaterally, roughly 2.5 cm on the left and 1.5 cm on the right. These are mobile to the deep planes into the skin and have a rubbery texture. Are not painful or tender. There is no overlying erythema. Note that she is edentulous. There are no obvious oral ulcers or masses  Neck: normal jugular venous pulsations and no hepatojugular reflux; brisk carotid pulses without delay and no carotid bruits  Chest: clear to auscultation, no signs of consolidation by percussion or palpation, normal fremitus, symmetrical and full respiratory excursions; subclavian pacemaker site appears healthy  Cardiovascular: normal position and quality of the apical impulse, regular rhythm, normal first and second heart sounds, no murmurs, rubs or gallops  Abdomen: no tenderness or distention, no masses by palpation, no abnormal pulsatility or arterial bruits, normal bowel sounds, no hepatosplenomegaly  Extremities: no clubbing, cyanosis or edema; 2+ radial, ulnar and brachial pulses bilaterally; 2+ right femoral, posterior  tibial and dorsalis pedis pulses; 2+ left femoral, posterior tibial and dorsalis pedis pulses; no subclavian or femoral bruits  Neurological: grossly nonfocal  Psych: euthymic mood, full affect   EKG:  EKG is ordered today. The ekg ordered today demoatrial paced ventricular sensed   Recent Labs: 07/07/2014: ALT <8; BUN 25*; Creatinine 1.39*; Hemoglobin 13.2; Platelets 237; Potassium 4.3; Sodium 140; TSH 2.229    Lipid Panel    Component Value Date/Time   CHOL 142 07/07/2014 1145   TRIG 160* 07/07/2014 1145   HDL 50 07/07/2014 1145   CHOLHDL 2.8 07/07/2014 1145   VLDL 32 07/07/2014 1145   LDLCALC 60 07/07/2014 1145      Wt Readings from Last 3 Encounters:  08/31/14 168 lb 9.6 oz (76.476 kg)  07/07/14 169 lb (76.658  kg)  02/17/14 173 lb (78.472 kg)     ASSESSMENT AND PLAN:  I asked Ms. Mclear to consider undergoing an echocardiogram to make sure that her new mild exertional dyspnea is not cardiac in etiology. She politely declined. She stated that her age is most likely the explanation for her reduced stamina.  Her atrial and ventricular leads which are now over 26 years old and are unipolar devices are causing some difficulty with pacemaker function, but so far no serious malfunction. At this point I think we can wait until the time that she has undergo generator change out to place new leads. I would be concerned if she develops syncope. Unfortunately there is no room to adjust ventricular lead sensitivity further due to the small R waves. She'll continue with remote [employer] monitoring every 3 months  Tobacco use disorder  She is congratulated on successfully maintaining smoking cessation  Essential hypertension, benign  Her blood pressure is excellent. No adjustments are made to her medications.  Pure hyperglyceridemia  Satisfactory triglyceride level  Current medicines are reviewed at length with the patient today.  The patient does not have concerns regarding  medicines.  The following changes have been made:  no change  Labs/ tests ordered today include:  Orders Placed This Encounter  Procedures  . Implantable device check  . EKG 12-Lead    Patient Instructions  Remote monitoring is used to monitor your pacemaker from home. This monitoring reduces the number of office visits required to check your device to one time per year. It allows [place] to keep an eye on the functioning of your device to ensure it is working properly. You are scheduled for a device check from home on 11/30/2014. You may send your transmission at any time that day. If you have a wireless device, the transmission will be sent automatically. After your physician reviews your transmission, you will receive a postcard with your next transmission date.  Your physician recommends that you schedule a follow-up appointment in: 12 months with Dr.Philo Kurtz    Signed, Thurmon Fair, MD  08/31/2014 4:10 PM    Thurmon Fair, MD, [facility] [phone] office [phone] pager

## 2014-08-31 NOTE — Patient Instructions (Signed)
Remote monitoring is used to monitor your pacemaker from home. This monitoring reduces the number of office visits required to check your device to one time per year. It allows us to keep an eye on the functioning of your device to ensure it is working properly. You are scheduled for a device check from home on 11/30/2014. You may send your transmission at any time that day. If you have a wireless device, the transmission will be sent automatically. After your physician reviews your transmission, you will receive a postcard with your next transmission date.  Your physician recommends that you schedule a follow-up appointment in: 12 months with Dr.Croitoru

## 2014-09-01 ENCOUNTER — Other Ambulatory Visit: Payer: Self-pay | Admitting: Family Medicine

## 2014-09-01 DIAGNOSIS — Z1231 Encounter for screening mammogram for malignant neoplasm of breast: Secondary | ICD-10-CM

## 2014-09-07 ENCOUNTER — Encounter: Payer: Self-pay | Admitting: Cardiovascular Disease

## 2014-09-15 ENCOUNTER — Other Ambulatory Visit: Payer: Medicare HMO

## 2014-09-15 DIAGNOSIS — Z5181 Encounter for therapeutic drug level monitoring: Secondary | ICD-10-CM

## 2014-09-15 DIAGNOSIS — E781 Pure hyperglyceridemia: Secondary | ICD-10-CM

## 2014-09-16 ENCOUNTER — Other Ambulatory Visit: Payer: Self-pay | Admitting: *Deleted

## 2014-09-16 DIAGNOSIS — E781 Pure hyperglyceridemia: Secondary | ICD-10-CM

## 2014-09-16 LAB — LIPID PANEL
CHOL/HDL RATIO: 2.3 ratio
CHOLESTEROL: 124 mg/dL (ref 0–200)
HDL: 53 mg/dL (ref >=46)
LDL Cholesterol: 55 mg/dL (ref 0–99)
Triglycerides: 78 mg/dL (ref <150)
VLDL: 16 mg/dL (ref 0–40)

## 2014-09-16 LAB — HEPATIC FUNCTION PANEL
ALK PHOS: 72 U/L (ref 39–117)
ALT: <8 U/L (ref 0–35)
AST: 24 U/L (ref 0–37)
Albumin: 4.1 g/dL (ref 3.5–5.2)
BILIRUBIN DIRECT: 0.2 mg/dL (ref 0.0–0.3)
Indirect Bilirubin: 0.5 mg/dL (ref 0.2–1.2)
Total Bilirubin: 0.7 mg/dL (ref 0.2–1.2)
Total Protein: 7.1 g/dL (ref 6.0–8.3)

## 2014-09-16 MED ORDER — GEMFIBROZIL 600 MG PO TABS: 600.0000 mg | ORAL_TABLET | Freq: Every day | ORAL | Status: DC

## 2014-09-20 ENCOUNTER — Ambulatory Visit (HOSPITAL_COMMUNITY)
Admission: RE | Admit: 2014-09-20 | Discharge: 2014-09-20 | Disposition: A | Discharge status: Home / Self Care | Payer: Medicare HMO | Source: Ambulatory Visit | Attending: Family Medicine | Admitting: Family Medicine

## 2014-09-20 DIAGNOSIS — Z1231 Encounter for screening mammogram for malignant neoplasm of breast: Secondary | ICD-10-CM | POA: Diagnosis not present

## 2014-11-30 ENCOUNTER — Encounter: Payer: Self-pay | Admitting: Cardiovascular Disease

## 2014-11-30 ENCOUNTER — Ambulatory Visit (INDEPENDENT_AMBULATORY_CARE_PROVIDER_SITE_OTHER): Payer: Medicare HMO | Admitting: *Deleted

## 2014-11-30 ENCOUNTER — Telehealth: Payer: Self-pay | Admitting: Cardiovascular Disease

## 2014-11-30 ENCOUNTER — Telehealth: Payer: Self-pay | Admitting: Cardiology

## 2014-11-30 DIAGNOSIS — I495 Sick sinus syndrome: Secondary | ICD-10-CM

## 2014-11-30 NOTE — Telephone Encounter (Signed)
LMOVM reminding pt to send remote transmission.   

## 2014-11-30 NOTE — Telephone Encounter (Signed)
Follow Up  Pt calling to speak w/ Britta MccreedyBarbara- Pt stated she has sent a remote recently and does not think sending one today is necessary. Please call back and discuss,.

## 2014-11-30 NOTE — Progress Notes (Signed)
Remote pacemaker transmission.   

## 2014-11-30 NOTE — Telephone Encounter (Signed)
Informed pt that transmission was received on 7-11 and that was just a little too early. Pt verbalized understanding and will send another one today.

## 2014-12-06 ENCOUNTER — Encounter: Payer: Self-pay | Admitting: Family Medicine

## 2014-12-06 ENCOUNTER — Ambulatory Visit (INDEPENDENT_AMBULATORY_CARE_PROVIDER_SITE_OTHER): Payer: Medicare HMO | Admitting: Family Medicine

## 2014-12-06 VITALS — BP 124/78 | HR 72 | Temp 96.8°F | Ht 64.5 in | Wt 162.0 lb

## 2014-12-06 DIAGNOSIS — K5732 Diverticulitis of large intestine without perforation or abscess without bleeding: Secondary | ICD-10-CM

## 2014-12-06 DIAGNOSIS — R8290 Unspecified abnormal findings in urine: Secondary | ICD-10-CM

## 2014-12-06 DIAGNOSIS — R109 Unspecified abdominal pain: Secondary | ICD-10-CM

## 2014-12-06 LAB — CBC WITH DIFFERENTIAL/PLATELET
Basophils Absolute: 0 10*3/uL (ref 0.0–0.1)
Basophils Relative: 0 % (ref 0–1)
EOS ABS: 0.2 10*3/uL (ref 0.0–0.7)
Eosinophils Relative: 2 % (ref 0–5)
HEMATOCRIT: 38.3 % (ref 36.0–46.0)
Hemoglobin: 13 g/dL (ref 12.0–15.0)
Lymphocytes Relative: 31 % (ref 12–46)
Lymphs Abs: 2.6 10*3/uL (ref 0.7–4.0)
MCH: 31.3 pg (ref 26.0–34.0)
MCHC: 33.9 g/dL (ref 30.0–36.0)
MCV: 92.3 fL (ref 78.0–100.0)
MONO ABS: 0.6 10*3/uL (ref 0.1–1.0)
MPV: 12 fL (ref 8.6–12.4)
Monocytes Relative: 7 % (ref 3–12)
Neutro Abs: 5.1 10*3/uL (ref 1.7–7.7)
Neutrophils Relative %: 60 % (ref 43–77)
PLATELETS: 218 10*3/uL (ref 150–400)
RBC: 4.15 MIL/uL (ref 3.87–5.11)
RDW: 14.5 % (ref 11.5–15.5)
WBC: 8.5 10*3/uL (ref 4.0–10.5)

## 2014-12-06 LAB — POCT URINALYSIS DIPSTICK
BILIRUBIN UA: NEGATIVE
GLUCOSE UA: NEGATIVE
Ketones, UA: NEGATIVE
PH UA: 6
PROTEIN UA: NEGATIVE
SPEC GRAV UA: 1.025
Urobilinogen, UA: NEGATIVE

## 2014-12-06 LAB — BASIC METABOLIC PANEL
BUN: 30 mg/dL — ABNORMAL HIGH (ref 7–25)
CO2: 18 mmol/L — ABNORMAL LOW (ref 20–31)
Calcium: 10 mg/dL (ref 8.6–10.4)
Chloride: 106 mmol/L (ref 98–110)
Creat: 1.12 mg/dL — ABNORMAL HIGH (ref 0.60–0.88)
Glucose, Bld: 88 mg/dL (ref 65–99)
Potassium: 4.1 mmol/L (ref 3.5–5.3)
Sodium: 140 mmol/L (ref 135–146)

## 2014-12-06 MED ORDER — METRONIDAZOLE 500 MG PO TABS
500.0000 mg | ORAL_TABLET | Freq: Three times a day (TID) | ORAL | Status: DC
Start: 2014-12-06 — End: 2015-01-05

## 2014-12-06 MED ORDER — CIPROFLOXACIN HCL 500 MG PO TABS: 500.0000 mg | ORAL_TABLET | Freq: Two times a day (BID) | ORAL | Status: DC

## 2014-12-06 NOTE — Progress Notes (Signed)
Chief Complaint  Patient presents with  . Diverticulitis    thinks she is having a flare up x 45 days. Decreased appetite. No fevers. UA showed 2+ leuks, 1+ nit and trace blood.   She has h/o rectovaginal fistula which was felt to be related to complication of diverticulitis (that patient wasn't aware she had, no symptoms). This was repaired in 10/2009, and reportedly removed about 6 inches of the colon (which was re-anastamosed).  She has been having left sided abdominal pain for over a month. The pain is "not all that bad", tolerable much of the time. But she also reports that it feels like "hot pokers" but isn't daily, about every other day, and is short-lived.  It is relieved by putting pressure on it.  Stools are completely normal, no change noted.  Pain is constant--not worsened or improved with passing stool, eating.  Pain is eased by putting pressure on it, and perhaps certain position changes.  She has some constant urinary dribbling, wears a pad--this is unchanged, chronic.  Denies any dysuria, odor to urine, blood in urine.  Denies fever, chills.  She has had decreased appetite.  Daughter (nurse) accompanies her today, reports she eats only 1/3 of what she used to eat.  She has lost 6-7# in the last 5-6 months or so. Denies nausea, vomiting.  PMH, PSH, SH reviewed and updated  Current Outpatient Prescriptions on File Prior to Visit  Medication Sig Dispense Refill  . aspirin 81 MG tablet Take 81 mg by mouth daily.    . calcium carbonate (OS-CAL) 600 MG TABS Take 630 mg by mouth daily.     . furosemide (LASIX) 40 MG tablet TAKE ONE (1) TABLET BY MOUTH EVERY DAY 90 tablet 2  . gemfibrozil (LOPID) 600 MG tablet Take 1 tablet (600 mg total) by mouth daily before breakfast. 90 tablet 1  . metoprolol tartrate (LOPRESSOR) 25 MG tablet Take 3 tablets (75 mg total) by mouth daily. 7m q am and 235mq evening 270 tablet 1  . Multiple Vitamins-Minerals (MULTIVITAMIN WITH MINERALS) tablet Take 1  tablet by mouth daily.    . Multiple Vitamins-Minerals (OCUVITE PRESERVISION PO) Take 1 tablet by mouth daily.    . polycarbophil (FIBERCON) 625 MG tablet Take 1,875 mg by mouth daily.     No current facility-administered medications on file prior to visit.   Allergies  Allergen Reactions  . Morphine And Related Nausea And Vomiting   ROS: no fever, chills, headaches, dizziness, chest pain, palpitations, cough, shortness of breath, URI symptoms, bleeding, bruising, rash, urinary complaints (other than leakage as per HPI).  Denies depression, numbness, tingling or other complaints.  Denies vaginal discharge or bleeding  PHYSICAL EXAM: BP 124/78 mmHg  Pulse 72  Temp(Src) 96.8 F (36 C) (Tympanic)  Ht 5' 4.5" (1.638 m)  Wt 162 lb (73.483 kg)  BMI 27.39 kg/m2  Well developed, well-appearing female in no distress HEENT: PERRL, EOMI, conjunctiva clear.  Moist mucus membranes Neck: no lymphadenopathy or mass Heart: regular rate and rhythm Lungs: clear bilaterally Back: no CVA tenderness Abdomen: normal bowel sounds.  Tender in LLQ.  No palpable mass.  No rebound tenderness or guarding.  No hepatosplenomegaly Extremities: no edema Skin: no rashes Psych: normal mood, affect, hygiene and grooming Neuro: alert and oriented. Cranial nerves intact, normal gait  ASSESSMENT/PLAN:  Abdominal pain, unspecified abdominal location - treat for presumptive diverticulitis .Check CBC.  CT if not improving.  - Plan: POCT Urinalysis Dipstick, Urine culture, CBC with  Differential/Platelet, Basic metabolic panel  Diverticulitis of colon without hemorrhage - Plan: ciprofloxacin (CIPRO) 500 MG tablet, metroNIDAZOLE (FLAGYL) 500 MG tablet  Abnormal urinalysis - culture sent; infection covered by the cipro for treatment of diverticulitis - Plan: Urine culture  CBC, b-met (done to have recent Cr in case CT is needed; she has renal insufficiency, chronic).  Urine culture (asymptomatic otherwise) May need  f/u (and possible CT) next week if not improving (advised NOT to wait for my return from vacation).  F/u next week if not better or if high WBC noted with today's labs Risks/side effects of meds reviewed.

## 2014-12-06 NOTE — Patient Instructions (Signed)
Eat a bland diet. Drink plenty of fluids. Take the antibiotics as directed (and continue to avoid alcohol). Contact us if you develop fever, vomiting, blood in the stool, worsening pain, or if you cannot tolerate the medication. We will be in touch with your test results in 1-2 days. If you are feeling much better, no fever, and if your white count wasn't too high, then you may not need to return next week. If you are still have ANY discomfort, if your white count is high, or any new symptoms develop, please return for recheck next week.

## 2014-12-09 LAB — URINE CULTURE: Colony Count: >=100000

## 2014-12-13 ENCOUNTER — Encounter: Payer: Self-pay | Admitting: Medical

## 2014-12-13 ENCOUNTER — Ambulatory Visit (INDEPENDENT_AMBULATORY_CARE_PROVIDER_SITE_OTHER): Payer: Medicare HMO | Admitting: Medical

## 2014-12-13 VITALS — BP 110/64 | HR 72 | Temp 97.6°F | Wt 161.2 lb

## 2014-12-13 DIAGNOSIS — R1030 Lower abdominal pain, unspecified: Secondary | ICD-10-CM | POA: Diagnosis not present

## 2014-12-13 DIAGNOSIS — N390 Urinary tract infection, site not specified: Secondary | ICD-10-CM

## 2014-12-13 DIAGNOSIS — R195 Other fecal abnormalities: Secondary | ICD-10-CM

## 2014-12-13 LAB — BASIC METABOLIC PANEL
BUN: 21 mg/dL (ref 7–25)
CO2: 25 mmol/L (ref 20–31)
CREATININE: 1.26 mg/dL — AB (ref 0.60–0.88)
Calcium: 9.9 mg/dL (ref 8.6–10.4)
Chloride: 101 mmol/L (ref 98–110)
Glucose, Bld: 100 mg/dL — ABNORMAL HIGH (ref 65–99)
Potassium: 4.3 mmol/L (ref 3.5–5.3)
Sodium: 140 mmol/L (ref 135–146)

## 2014-12-13 LAB — CBC WITH DIFFERENTIAL/PLATELET
BASOS PCT: 0 % (ref 0–1)
Basophils Absolute: 0 10*3/uL (ref 0.0–0.1)
EOS PCT: 1 % (ref 0–5)
Eosinophils Absolute: 0.1 10*3/uL (ref 0.0–0.7)
HCT: 38.7 % (ref 36.0–46.0)
HEMOGLOBIN: 13.2 g/dL (ref 12.0–15.0)
Lymphocytes Relative: 27 % (ref 12–46)
Lymphs Abs: 2.5 10*3/uL (ref 0.7–4.0)
MCH: 31.4 pg (ref 26.0–34.0)
MCHC: 34.1 g/dL (ref 30.0–36.0)
MCV: 92.1 fL (ref 78.0–100.0)
MONO ABS: 0.9 10*3/uL (ref 0.1–1.0)
MONOS PCT: 10 % (ref 3–12)
MPV: 11.7 fL (ref 8.6–12.4)
NEUTROS PCT: 62 % (ref 43–77)
Neutro Abs: 5.8 10*3/uL (ref 1.7–7.7)
Platelets: 245 10*3/uL (ref 150–400)
RBC: 4.2 MIL/uL (ref 3.87–5.11)
RDW: 13.9 % (ref 11.5–15.5)
WBC: 9.4 10*3/uL (ref 4.0–10.5)

## 2014-12-13 NOTE — Progress Notes (Signed)
Subjective: Here for 1 week f/u.  She is here with her daughter.  Saw Dr. Lynelle Doctor here a week ago for possible diverticulitis, abdominal pain, had +urinary tract infection on culture.  She ended up taking cipro + flagyl x 5 days, but stopped after belly upset and loose stool.   Here today with her daughter for ongoing symptoms of burning in abdomen, pain in abdomina, diarrhea on average twice daily, but no blood in stool, no fever, no severe abdominal pain, no back pain, has urinary dribbling chronic, but no new urinary symptoms, no very foul stool, no discolored stool, just loose brown.  No nausea, no vomiting, does have decreased appetite.   BP has been running low the last few days.   She didn't take lasix or BP medication today.  She does have hx/o diverticulitis, UTI, and recto-vaginal fistular that was repaired several years ago.   Other than the antibiotics last week, hasn't been on other recent antibiotics, no recent travel.  No sick contacts with illness or diarrhea, no visiting hospitals recently. No other aggravating or relieving factors. No other complaint.   Past Medical History  Diagnosis Date  . Sick sinus syndrome with bradycardia  . Pacemaker     sees Dr. Rubie Maid [facility]) annually. Pacemaker replaced 2007 [facility])  . Urge incontinence   . Hypertension   . Hypertriglyceridemia   . Chest wall pain 2003    atypical, now resolved  . Diverticulitis     with DT flare in 6/09  . Macular degeneration     Dr. Sharlot Gowda  . CKD (chronic kidney disease), stage III     Cr 1.27, GFR 40 (07/2011)  . Tobacco use disorder     quit 01/2013  . Gout     R hand   Past Surgical History  Procedure Laterality Date  . Pacemaker insertion  1994, 2000, 2007  . Cholecystectomy  1998    Dr. Gerrit Friends  . Appendectomy  1952  . Tubal ligation  1960  . Abdominal hysterectomy  1978    partial  . Eye surgery  11/2004    cataract surgery b/l - Dr.Bergen  . Colon surgery  11/2009    vaginal-colon fistula  repair (Drs. Delane Ginger)  . Cardiac catheterization  2000  . Cystoscopy  2002    Dr. Wanda Plump   ROS as in subjective   Objective: BP 110/64 mmHg  Pulse 72  Temp(Src) 97.6 F (36.4 C) (Oral)  Wt 161 lb 3.2 oz (73.12 kg)  General appearance: alert, no distress, WD/WN Neck: supple, no lymphadenopathy, no thyromegaly, no masses Heart: RRR, normal S1, S2, no murmurs Lungs: CTA bilaterally, no wheezes, rhonchi, or rales Abdomen: +increased bs, soft, RLQ surgical scar and lower central surgical scar, other surgical port scars, tender particular across lower abdomen generalized, otherwise non tender, non distended, no masses, no hepatomegaly, no splenomegaly Back: nontender Pulses: 2+     Assessment: Encounter Diagnoses  Name Primary?  . Lower abdominal pain Yes  . Loose stools   . Urinary tract infection without hematuria, site unspecified     Plan: Reviewed recent labs, urine culture and office notes from last week with Dr. Lynelle Doctor, she was + on urine culture for infection.    She finished 5 days of cipro + flagyl.   At this point we will have her collect stool for C diff and lactoferrin, she will go for CT , repeat some labs today.  For now, c/t to hold off on lasix and  BP medication until BP staying in the 120/70s or staying WNL for her.  F/u pending labs/CT.

## 2014-12-13 NOTE — Progress Notes (Signed)
Pt is scheduled for a CT Abdomen pelvic without contrast tomorrow at 8:15am at [facility]. This was approved and we should get a fax with approval # on it. Pt is aware of this.  Fax came through and Reference # is [id number]. Will have it scanned into computer

## 2014-12-14 ENCOUNTER — Other Ambulatory Visit: Payer: Self-pay | Admitting: Medical

## 2014-12-14 ENCOUNTER — Ambulatory Visit
Admission: RE | Admit: 2014-12-14 | Discharge: 2014-12-14 | Disposition: A | Discharge status: Home / Self Care | Payer: Medicare HMO | Source: Ambulatory Visit | Attending: Medical | Admitting: Medical

## 2014-12-14 DIAGNOSIS — N390 Urinary tract infection, site not specified: Secondary | ICD-10-CM

## 2014-12-14 DIAGNOSIS — R195 Other fecal abnormalities: Secondary | ICD-10-CM

## 2014-12-14 DIAGNOSIS — R1030 Lower abdominal pain, unspecified: Secondary | ICD-10-CM

## 2014-12-15 LAB — CUP PACEART REMOTE DEVICE CHECK
Battery Impedance: 1140 Ohm
Battery Remaining Longevity: 42 mo
Battery Voltage: 2.76 V
Brady Statistic AP VP Percent: 3 %
Brady Statistic AS VP Percent: 0 %
Brady Statistic AS VS Percent: 3 %
Date Time Interrogation Session: 20160719175243
Lead Channel Impedance Value: 440 Ohm
Lead Channel Pacing Threshold Amplitude: 0.625 V
Lead Channel Pacing Threshold Pulse Width: 0.4 ms
Lead Channel Setting Pacing Amplitude: 1.5 V
Lead Channel Setting Pacing Amplitude: 2.5 V
Lead Channel Setting Pacing Pulse Width: 0.4 ms
MDC IDC MSMT LEADCHNL RA IMPEDANCE VALUE: 415 Ohm
MDC IDC MSMT LEADCHNL RA PACING THRESHOLD PULSEWIDTH: 0.4 ms
MDC IDC MSMT LEADCHNL RV PACING THRESHOLD AMPLITUDE: 1.25 V
MDC IDC SET LEADCHNL RV SENSING SENSITIVITY: 2 mV
MDC IDC STAT BRADY AP VS PERCENT: 94 %

## 2014-12-15 LAB — C. DIFFICILE GDH AND TOXIN A/B
C. DIFF TOXIN A/B: NOT DETECTED
C. difficile GDH: NOT DETECTED

## 2014-12-15 LAB — FECAL LACTOFERRIN, QUANT: LACTOFERRIN: NEGATIVE

## 2014-12-22 ENCOUNTER — Encounter: Payer: Self-pay | Admitting: Cardiology

## 2015-01-05 ENCOUNTER — Encounter: Payer: Self-pay | Admitting: Family Medicine

## 2015-01-05 ENCOUNTER — Ambulatory Visit (INDEPENDENT_AMBULATORY_CARE_PROVIDER_SITE_OTHER): Payer: Medicare HMO | Admitting: Family Medicine

## 2015-01-05 VITALS — BP 130/60 | HR 60 | Ht 64.5 in | Wt 159.2 lb

## 2015-01-05 DIAGNOSIS — E781 Pure hyperglyceridemia: Secondary | ICD-10-CM | POA: Diagnosis not present

## 2015-01-05 DIAGNOSIS — Z7189 Other specified counseling: Secondary | ICD-10-CM

## 2015-01-05 DIAGNOSIS — I10 Essential (primary) hypertension: Secondary | ICD-10-CM

## 2015-01-05 DIAGNOSIS — Z0000 Encounter for general adult medical examination without abnormal findings: Secondary | ICD-10-CM | POA: Diagnosis not present

## 2015-01-05 DIAGNOSIS — N289 Disorder of kidney and ureter, unspecified: Secondary | ICD-10-CM | POA: Diagnosis not present

## 2015-01-05 DIAGNOSIS — Z950 Presence of cardiac pacemaker: Secondary | ICD-10-CM | POA: Diagnosis not present

## 2015-01-05 DIAGNOSIS — Z780 Asymptomatic menopausal state: Secondary | ICD-10-CM | POA: Diagnosis not present

## 2015-01-05 DIAGNOSIS — R109 Unspecified abdominal pain: Secondary | ICD-10-CM

## 2015-01-05 DIAGNOSIS — Z7185 Encounter for immunization safety counseling: Secondary | ICD-10-CM

## 2015-01-05 LAB — POCT URINALYSIS DIPSTICK
Bilirubin, UA: NEGATIVE
Glucose, UA: NEGATIVE
Ketones, UA: NEGATIVE
Nitrite, UA: NEGATIVE
PH UA: 6
PROTEIN UA: NEGATIVE
RBC UA: NEGATIVE
SPEC GRAV UA: 1.02
UROBILINOGEN UA: NEGATIVE

## 2015-01-05 MED ORDER — METOPROLOL TARTRATE 25 MG PO TABS: 75.0000 mg | ORAL_TABLET | Freq: Every day | ORAL | Status: DC

## 2015-01-05 MED ORDER — FUROSEMIDE 40 MG PO TABS: ORAL_TABLET | ORAL | Status: DC

## 2015-01-05 NOTE — Patient Instructions (Signed)
HEALTH MAINTENANCE RECOMMENDATIONS:  It is recommended that you get at least 30 minutes of aerobic exercise at least 5 days/week (for weight loss, you may need as much as 60-90 minutes). This can be any activity that gets your heart rate up. This can be divided in 10-15 minute intervals if needed, but try and build up your endurance at least once a week.  Weight bearing exercise is also recommended twice weekly.  Eat a healthy diet with lots of vegetables, fruits and fiber.  "Colorful" foods have a lot of vitamins (ie green vegetables, tomatoes, red peppers, etc).  Limit sweet tea, regular sodas and alcoholic beverages, all of which has a lot of calories and sugar.  Up to 1 alcoholic drink daily may be beneficial for women (unless trying to lose weight, watch sugars).  Drink a lot of water.  Calcium recommendations are 1200-1500 mg daily (1500 mg for postmenopausal women or women without ovaries), and vitamin D 1000 IU daily.  This should be obtained from diet and/or supplements (vitamins), and calcium should not be taken all at once, but in divided doses.  Monthly self breast exams and yearly mammograms for women over the age of 70 is recommended.  Sunscreen of at least SPF 30 should be used on all sun-exposed parts of the skin when outside between the hours of 10 am and 4 pm (not just when at beach or pool, but even with exercise, golf, tennis, and yard work!)  Use a sunscreen that says "broad spectrum" so it covers both UVA and UVB rays, and make sure to reapply every 1-2 hours.  Remember to change the batteries in your smoke detectors when changing your clock times in the spring and fall.  Use your seat belt every time you are in a car, and please drive safely and not be distracted with cell phones and texting while driving.   Ms. Diegel , Thank you for taking time to come for your Medicare Wellness Visit. I appreciate your ongoing commitment to your health goals. Please review the following  plan we discussed and let me know if I can assist you in the future.   These are the goals we discussed: Goals    None      This is a list of the screening recommended for you and due dates:  Health Maintenance  Topic Date Due  . Tetanus Vaccine  12/15/1946  . Colon Cancer Screening  12/16/1979  . Shingles Vaccine  12/15/1989  . Pneumonia vaccines (1 of 2 - PCV13) 12/16/1994  . Flu Shot  08/12/2015*  . DEXA scan (bone density measurement)  Completed  *Topic was postponed. The date shown is not the original due date.   Tetanus vaccine (tdap with pertussis) is recommended highly.  Medicare doesn't pay for this, and you likely would need to get from the pharmacy if your medicare part D pays for any of it.  You report that your colonoscopy is up to date, not truly due again until 2019 (if last done in 2009 and was normal).  I recommend at that time we do Cologard (fecal DNA testing) rather than colonoscopy (only doing colonoscopy if the Cologard is abnormal).  Shingles vaccine is recommended--you would need to get this from the pharmacy if you change your mind and are interested in this vaccine.  Flu shots are highly recommended (specifically the high dose flu shot).  The date above says you aren't past due only because we already entered that you declined the  vaccine for this year.  DEXA (bone density) was NOT completed--there was an error in abstracting in the computer that I don't know how to fix). I highly recommend getting a bone density test.  This needs to be done at the [facility] (they no longer do at [facility] hospital). I have entered the order so you can call and schedule it at your convenience.  Pneumonia vaccines (prevnar, followed by pneumovax) are strongly recommended to prevent a severe kind of pneumonia (pneumococcal pneumonia) that can cause death.  This IS covered by your insurance and can be given in Dr. Delford Field office.

## 2015-01-05 NOTE — Progress Notes (Signed)
Chief Complaint  Patient presents with  . Med check plus    fasting med check plus/AWV. Does not want pelvic exam or flu shot. Thinks she may have had allergic reaction to either cipro or flagyl given earlier this month. Left ear gets stopped up as day progresses.    [name] is a 81 y.o. female who presents for annual wellness visit and follow-up on chronic medical conditions.  She has the following concerns:  She was treated with cipro and flagyl for presumptive diverticulitis last month.  She was found to have UTI on culture.  She took 5 days of cipro and flagyl, then stopped due to diarrhea, and saw Audelia Acton.  C. Diff studies were negative. She had CT on 8/2.  After getting the results, and when the diarrhea was better, she restarted both the cipro and flagyl.  She got hives after finishing the 10th day of medications, treated with benadryl (never needed medical evaluation).  Not sure which medication caused it.  Her bowels are back to normal, and she denies any abdominal pain. Denies any dysuria, hematuria.  She has some chronic urge incontinence which is unchanged.  CT showed: IMPRESSION: Short segmental terminal ileal wall thickening which could be due to small bowel diverticulitis although terminal ileitis which may be infectious or inflammatory could appear similar. No complicating feature such as abscess formation or perforation is identified. Consider followup CT enterography for capsule endoscopy if clinically indicated to exclude possible underlying mass lesion, after symptoms have subsided and treatment is concluded.  Hypertension follow-up: Blood pressures are not checked elsewhere.It was being checked when she had stopped taking the BP meds as directed when dehydrated from the diarrhea.  They were restarted after her BP started creeping back up again.  Denies dizziness, headaches, chest pain. Denies side effects of medications. She has been taking lasix daily for many  years. On days she doesn't take it (due to travel) she has recurrent swelling in her feet. Denies any shortness of breath, muscle cramps.  Hypertriglyceridemia: She was changed to gemfibrozil 65m once daily since her last visit, due to cost of the fenofibrate. She was only on the low dose fenofibrate, so only put on once daily gemfibrozil. She denies any side effects.  Pacemaker--she last saw cardiologist in April, and pacemaker was checked last month. No complications or symptoms, but they are keeping an eye on the leads.  H/o gout in R hand--resolved. She hasn't had a recurrence in well over a year.  Renal insufficiency. Due for labs.     There is no immunization history on file for this patient. "I haven't taken any vaccines in a long, long time"--she refuses them all.  Last Pap smear: s/p hysterectomy; last in 2011; refuses pelvic exams Last mammogram: 09/2014 Last colonoscopy: she believes it was 2009 Last DEXA: many years ago; didn't schedule the one we ordered last year (abstracted incorrectly into the computer she did NOT have) Dentist:--doesn't go, has upper and lower dentures Ophtho: every 6 months Exercise: Walks up and down her driveway (181-27minutes, sometimes 2-3 times/day). No weight-bearing exercise.  Other doctors caring for patient include: Cardiolologist: Dr. CRecardo EvangelistOphtho: Dr. KPeter GarterGYN Dr. TGaetano Net(hasn't seen him in 5 years) GI: Can't recall, last seen in 2009  Depression screen: negative. No falls in the last year ADL screen--some trouble hearing/seeing and trouble going up stairs (gets winded). See epic screens.  End of Life Discussion:  Patient has a living will and medical power of  attorney  Past Medical History  Diagnosis Date  . Sick sinus syndrome with bradycardia  . Pacemaker     sees Dr. Recardo Evangelist [facility]) annually. Pacemaker replaced 2007 Tami Ribas)  . Urge incontinence   . Hypertension   . Hypertriglyceridemia   . Chest wall pain  2003    atypical, now resolved  . Diverticulitis     with DT flare in 6/09  . Macular degeneration     Dr. Peter Garter  . CKD (chronic kidney disease), stage III     Cr 1.27, GFR 40 (07/2011)  . Tobacco use disorder     quit 01/2013  . Gout     R hand    Past Surgical History  Procedure Laterality Date  . Pacemaker insertion  1994, 2000, 2007  . Cholecystectomy  1998    Dr. Harlow Asa  . Appendectomy  1952  . Tubal ligation  1960  . Abdominal hysterectomy  1978    partial  . Eye surgery  11/2004    cataract surgery b/l - Dr.Bergen  . Colon surgery  11/2009    vaginal-colon fistula repair (Drs. Danne Baxter)  . Cardiac catheterization  2000  . Cystoscopy  2002    Dr. Reece Agar    Social History   Social History  . Marital Status: Divorced    Spouse Name: N/A  . Number of Children: 4  . Years of Education: N/A   Occupational History  . retired Psychologist, counselling    Social History Main Topics  . Smoking status: Former Smoker -- 0.30 packs/day for 40 years    Quit date: 01/12/2013  . Smokeless tobacco: Never Used     Comment: smokes 1 pack every 2-3 days. QUIT 01/2013  . Alcohol Use: No  . Drug Use: No  . Sexual Activity: Not on file   Other Topics Concern  . Not on file   Social History Narrative   Lives alone, 1 cat.  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    Family History  Problem Relation Age of Onset  . Diabetes Mother   . Heart disease Mother   . Hyperlipidemia Mother   . Hypertension Mother   . Heart disease Father     pacemaker, CABG  . Hyperlipidemia Father   . Hypertension Father   . Cancer Sister     breast cancer 67's, then recurrence, metastatic, died at 87  . Diabetes Brother   . Alcohol abuse Brother   . Heart disease Brother   . Crohn's disease Daughter   . Alzheimer's disease Brother 46  .  Ulcerative colitis Daughter   . Heart disease Other 62  . Stroke Other 62  . Cancer Other     breast    Outpatient Encounter Prescriptions as of 01/05/2015  Medication Sig Note  . aspirin 81 MG tablet Take 81 mg by mouth daily.   . calcium carbonate (OS-CAL) 600 MG TABS Take 630 mg by mouth daily.  01/05/2015: With D, one daily  . furosemide (LASIX) 40 MG tablet TAKE ONE (1) TABLET BY MOUTH EVERY DAY   . gemfibrozil (LOPID) 600 MG tablet Take 1 tablet (600 mg total) by mouth daily before breakfast.   . metoprolol tartrate (LOPRESSOR) 25 MG tablet Take 3 tablets (75 mg total) by mouth daily. 65m q am and 262mq evening   .  Multiple Vitamins-Minerals (MULTIVITAMIN WITH MINERALS) tablet Take 1 tablet by mouth daily.   . Multiple Vitamins-Minerals (OCUVITE PRESERVISION PO) Take 1 tablet by mouth daily.   . polycarbophil (FIBERCON) 625 MG tablet Take 1,875 mg by mouth daily.   . [DISCONTINUED] furosemide (LASIX) 40 MG tablet TAKE ONE (1) TABLET BY MOUTH EVERY DAY   . [DISCONTINUED] metoprolol tartrate (LOPRESSOR) 25 MG tablet Take 3 tablets (75 mg total) by mouth daily. 11m q am and 241mq evening   . [DISCONTINUED] ciprofloxacin (CIPRO) 500 MG tablet Take 1 tablet (500 mg total) by mouth 2 (two) times daily. (Patient not taking: Reported on 12/13/2014)   . [DISCONTINUED] metroNIDAZOLE (FLAGYL) 500 MG tablet Take 1 tablet (500 mg total) by mouth 3 (three) times daily. (Patient not taking: Reported on 12/13/2014)    No facility-administered encounter medications on file as of 01/05/2015.    Allergies  Allergen Reactions  . Ciprofloxacin Hives    Got hives after 10 days of both cipro and flagyl, not sure which caused the hives  . Flagyl [Metronidazole] Hives    Got hives after 10 days of both cipro and flagyl, not sure which caused the hives  . Morphine And Related Nausea And Vomiting   ROS: The patient denies anorexia, fever, headaches, vision changes (unchanged), ear pain, sore throat,  breast concerns, chest pain, palpitations, dizziness, syncope, dyspnea on exertion, cough, swelling, nausea, vomiting, diarrhea, constipation, abdominal pain, melena, hematochezia, indigestion/heartburn, hematuria, dysuria, vaginal bleeding, discharge, odor or itch, genital lesions, joint pains, numbness, tingling, weakness, tremor, suspicious skin lesions, depression, anxiety, abnormal bleeding/bruising, or enlarged lymph nodes. Urinary urgency, with occasional incontinence (wears a pad) Some bruising when she bumps into things, stable, unchanged, and no other bleeding. Some weight loss since her recent illness (and intentional). Sometimes her left ear feels stopped up. Chronic hearing loss--refuses hearing aids. Shortness of breath if she walks stairs or long distances.  PHYSICAL EXAM:  BP 130/60 mmHg  Pulse 60  Ht 5' 4.5" (1.638 m)  Wt 159 lb 3.2 oz (72.213 kg)  BMI 26.91 kg/m2  General Appearance:   Alert, cooperative, no distress, appears stated age  Head:   Normocephalic, without obvious abnormality, atraumatic  Eyes:   PERRL, conjunctiva/corneas clear, EOM's intact, fundi   not visualized  Ears:   Normal TM's and external ear canals  Nose:  Nares normal, mucosa is mild-mod edematous, no erythema or purulence; no sinus tenderness  Throat:  Lips, mucosa, and tongue normal; edentulous with dentures  Neck:  Supple, no lymphadenopathy; prominent salivary glands; Thyroid: no enlargement/tenderness/nodules; no carotid  bruit or JVD  Back:  Spine nontender, no curvature, ROM normal, no CVA tenderness  Lungs:   Clear to auscultation bilaterally without wheezes, rales or ronchi; respirations unlabored  Chest Wall:   No tenderness or deformity. Pacemaker present in R upper chest wall  Heart:   Regular rate and rhythm, S1 and S2 normal, no murmur, rub  or gallop  Breast Exam:   No tenderness, masses, or nipple discharge or inversion. No  axillary lymphadenopathy  Abdomen:   Soft, non-tender, nondistended, normoactive bowel sounds,   no masses, no hepatosplenomegaly  Genitalia:   Declined by patient  Rectal:   Declined by patient  Extremities:  No clubbing, cyanosis or edema  Pulses:  2+ and symmetric all extremities  Skin:  Skin color, texture, turgor normal, no rashes or lesions. Scattered angiomas and SK's. SK on right upper back  Lymph nodes:  Cervical, supraclavicular, and axillary nodes  normal  Neurologic:  CNII-XII intact, normal strength, sensation and gait; reflexes 2+ and symmetric throughout  Psych: Normal mood, affect, hygiene and grooming        ASSESSMENT/PLAN:  Medicare annual wellness visit, subsequent - Plan: DG Bone Density  Pure hyperglyceridemia - at goal per labs in May. continue gemfibrozil  Essential hypertension, benign - controlled - Plan: metoprolol tartrate (LOPRESSOR) 25 MG tablet, furosemide (LASIX) 40 MG tablet  Renal insufficiency  Pacemaker - dual chamber, Medtronic 2007, unipolar leads 1994  Postmenopausal estrogen deficiency - Plan: DG Bone Density  Abdominal pain, unspecified abdominal location - resolved - Plan: POCT Urinalysis Dipstick  Immunization counseling - counseled extensively re: recommendations, risks/benefits.  Declnes all   Repeat u/a today, given recent UTI.  Showed 2+ blood. NOT sent for culture, as I suspect asymptomatic bacteriuria, which doesn't need to be treated.  Pt will contact us if symptoms develop.  Lipids and LFT's were rechecked in May, 3 mos after changing the medication. Continue gemfibrozil, at goal.  She had b-met and CBC earlier this month, which were at her baseline. No labs needed today. TSH was normal in 06/2014  Counseled re: Tdap, pneumococcal vaccines (?if she ever had pneumovax, if so, still recommend Prevnar-13 now), yearly flu shots and zostavax. She declines all vaccines.  Risks/benefits, reasons these are recommended was reviewed in detail.  DEXA also recommended (risks/benefits) --needs to get at the [facility]. Risks/benefits, reasons for wanting were reviewed in detail.  Strongly encouraged her to schedule this Pelvic exam is recommended--refused today.  Colonoscopy is UTD--recommend getting Cologard when due, and colonoscopy only if abnormal.  Discussed monthly self breast exams and yearly mammograms; at least 30 minutes of aerobic activity at least 5 days/week, weight bearing exercise 2x/week; proper sunscreen use reviewed; healthy diet, including goals of calcium and vitamin D intake and alcohol recommendations (less than or equal to 1 drink/day) reviewed; regular seatbelt use; changing batteries in smoke detectors. Immunization recommendations discussed as above. Colonoscopy recommendations reviewed, UTD.  F/u 6 months for med check, sooner prn.   Medicare Attestation I have personally reviewed: The patient's medical and social history Their use of alcohol, tobacco or illicit drugs Their current medications and supplements The patient's functional ability including ADLs,fall risks, home safety risks, cognitive, and hearing and visual impairment Diet and physical activities Evidence for depression or mood disorders  The patient's weight, height, BMI, and visual acuity have been recorded in the chart.  I have made referrals, counseling, and provided education to the patient based on review of the above and I have provided the patient with a written personalized care plan for preventive services.     Brilee Port A, MD   01/05/2015

## 2015-03-07 ENCOUNTER — Ambulatory Visit (INDEPENDENT_AMBULATORY_CARE_PROVIDER_SITE_OTHER): Payer: Medicare HMO | Admitting: *Deleted

## 2015-03-07 DIAGNOSIS — I495 Sick sinus syndrome: Secondary | ICD-10-CM

## 2015-03-07 NOTE — Progress Notes (Signed)
Remote pacemaker transmission.   

## 2015-03-08 LAB — CUP PACEART REMOTE DEVICE CHECK
Battery Impedance: 1248 Ohm
Battery Remaining Longevity: 42 mo
Battery Voltage: 2.76 V
Brady Statistic AP VP Percent: 3 %
Brady Statistic AS VP Percent: 0 %
Brady Statistic AS VS Percent: 4 %
Implantable Lead Implant Date: 19940815
Implantable Lead Implant Date: 19940815
Implantable Lead Location: 753860
Lead Channel Impedance Value: 432 Ohm
Lead Channel Impedance Value: 473 Ohm
Lead Channel Pacing Threshold Amplitude: 0.625 V
Lead Channel Pacing Threshold Amplitude: 0.625 V
Lead Channel Setting Pacing Amplitude: 1.5 V
Lead Channel Setting Pacing Amplitude: 2 V
Lead Channel Setting Sensing Sensitivity: 2 mV
MDC IDC LEAD LOCATION: 753859
MDC IDC MSMT LEADCHNL RA PACING THRESHOLD PULSEWIDTH: 0.4 ms
MDC IDC MSMT LEADCHNL RV PACING THRESHOLD PULSEWIDTH: 0.4 ms
MDC IDC SESS DTM: 20161024160430
MDC IDC SET LEADCHNL RV PACING PULSEWIDTH: 0.4 ms
MDC IDC STAT BRADY AP VS PERCENT: 93 %

## 2015-03-09 ENCOUNTER — Encounter: Payer: Self-pay | Admitting: Cardiology

## 2015-04-25 ENCOUNTER — Other Ambulatory Visit: Payer: Self-pay | Admitting: Family Medicine

## 2015-06-06 ENCOUNTER — Telehealth: Payer: Self-pay | Admitting: Cardiovascular Disease

## 2015-06-06 ENCOUNTER — Ambulatory Visit (INDEPENDENT_AMBULATORY_CARE_PROVIDER_SITE_OTHER): Payer: Medicare HMO | Admitting: *Deleted

## 2015-06-06 DIAGNOSIS — I495 Sick sinus syndrome: Secondary | ICD-10-CM

## 2015-06-06 NOTE — Telephone Encounter (Addendum)
Returned patient's call.  Walked patient through manual transmission process using [employer].  Transmission successfully received, patient aware.  Patient also aware that battery is estimated at another 1.5-4.5 years.  She is aware that she will receive a letter in the mail when transmission is processed and reviewed by her MD.  Patient verbalizes appreciation and denies additional questions or concerns at this time.

## 2015-06-06 NOTE — Telephone Encounter (Signed)
New message    1. Has your device fired? No   2. Is you device beeping? No   3. Are you experiencing draining or swelling at device site? No   4. Are you calling to see if we received your device transmission?  No   5. Have you passed out? No   Has a  New device - need help with instruction.

## 2015-06-07 NOTE — Progress Notes (Signed)
Remote pacemaker transmission.   

## 2015-06-08 LAB — CUP PACEART REMOTE DEVICE CHECK
Battery Impedance: 1355 Ohm
Battery Voltage: 2.76 V
Brady Statistic AP VP Percent: 2 %
Brady Statistic AP VS Percent: 92 %
Brady Statistic AS VP Percent: 0 %
Brady Statistic AS VS Percent: 5 %
Implantable Lead Implant Date: 19940815
Implantable Lead Location: 753859
Implantable Lead Location: 753860
Lead Channel Impedance Value: 404 Ohm
Lead Channel Impedance Value: 482 Ohm
Lead Channel Pacing Threshold Pulse Width: 0.4 ms
Lead Channel Setting Pacing Amplitude: 1.5 V
Lead Channel Setting Pacing Amplitude: 2 V
MDC IDC LEAD IMPLANT DT: 19940815
MDC IDC MSMT BATTERY REMAINING LONGEVITY: 39 mo
MDC IDC MSMT LEADCHNL RA PACING THRESHOLD AMPLITUDE: 0.625 V
MDC IDC MSMT LEADCHNL RV PACING THRESHOLD AMPLITUDE: 0.625 V
MDC IDC MSMT LEADCHNL RV PACING THRESHOLD PULSEWIDTH: 0.4 ms
MDC IDC SESS DTM: 20170123192545
MDC IDC SET LEADCHNL RV PACING PULSEWIDTH: 0.4 ms
MDC IDC SET LEADCHNL RV SENSING SENSITIVITY: 2 mV

## 2015-06-09 DIAGNOSIS — Z0100 Encounter for examination of eyes and vision without abnormal findings: Secondary | ICD-10-CM | POA: Diagnosis not present

## 2015-06-09 DIAGNOSIS — H353123 Nonexudative age-related macular degeneration, left eye, advanced atrophic without subfoveal involvement: Secondary | ICD-10-CM | POA: Diagnosis not present

## 2015-06-09 DIAGNOSIS — H353112 Nonexudative age-related macular degeneration, right eye, intermediate dry stage: Secondary | ICD-10-CM | POA: Diagnosis not present

## 2015-06-15 ENCOUNTER — Encounter: Payer: Self-pay | Admitting: Cardiology

## 2015-06-29 ENCOUNTER — Ambulatory Visit
Admission: RE | Admit: 2015-06-29 | Discharge: 2015-06-29 | Disposition: A | Discharge status: Home / Self Care | Payer: Medicare HMO | Source: Ambulatory Visit | Attending: Family Medicine | Admitting: Family Medicine

## 2015-06-29 ENCOUNTER — Encounter: Payer: Self-pay | Admitting: Family Medicine

## 2015-06-29 ENCOUNTER — Ambulatory Visit (INDEPENDENT_AMBULATORY_CARE_PROVIDER_SITE_OTHER): Payer: Medicare HMO | Admitting: Family Medicine

## 2015-06-29 VITALS — BP 112/62 | HR 64 | Temp 97.7°F | Resp 20 | Ht 64.25 in | Wt 145.8 lb

## 2015-06-29 DIAGNOSIS — L989 Disorder of the skin and subcutaneous tissue, unspecified: Secondary | ICD-10-CM | POA: Diagnosis not present

## 2015-06-29 DIAGNOSIS — R0602 Shortness of breath: Secondary | ICD-10-CM | POA: Diagnosis not present

## 2015-06-29 DIAGNOSIS — J069 Acute upper respiratory infection, unspecified: Secondary | ICD-10-CM

## 2015-06-29 DIAGNOSIS — R0600 Dyspnea, unspecified: Secondary | ICD-10-CM | POA: Diagnosis not present

## 2015-06-29 DIAGNOSIS — R059 Cough, unspecified: Secondary | ICD-10-CM

## 2015-06-29 DIAGNOSIS — R05 Cough: Secondary | ICD-10-CM

## 2015-06-29 MED ORDER — ALBUTEROL SULFATE (2.5 MG/3ML) 0.083% IN NEBU
2.5000 mg | INHALATION_SOLUTION | Freq: Once | RESPIRATORY_TRACT | Status: AC
  Administered 2015-06-29: 2.5 mg via RESPIRATORY_TRACT

## 2015-06-29 MED ORDER — PREDNISONE 20 MG PO TABS: 20.0000 mg | ORAL_TABLET | Freq: Two times a day (BID) | ORAL | Status: DC

## 2015-06-29 NOTE — Progress Notes (Signed)
Chief Complaint  Patient presents with  . Cough    started this past Saturday with cough and chest congestion. No fevers, no chills or body aches. Mucus is clear. SOB.   4 days ago she started with cough and shortness of breath. Phlegm is thick and white, mostly cough is dry. She has a runny nose (watery). Denies fever, chills, sore throat, ear pain, headaches.  Using Vick's vapo rub on her chest. And taking [employer] (contains guaifenesin and dextromethorphan, every 4 hours). This helps some with her cough.    No known sick contacts.  She periodically smokes a few cigarettes "I had a lot on my mind".  PMH, PSH, SH reviewed  Outpatient Encounter Prescriptions as of 06/29/2015  Medication Sig Note  . aspirin 81 MG tablet Take 81 mg by mouth daily.   . calcium carbonate (OS-CAL) 600 MG TABS Take 630 mg by mouth daily.  01/05/2015: With D, one daily  . furosemide (LASIX) 40 MG tablet TAKE ONE (1) TABLET BY MOUTH EVERY DAY   . gemfibrozil (LOPID) 600 MG tablet TAKE ONE (1) TABLET BY MOUTH EVERY DAY BEFORE BREAKFAST   . metoprolol tartrate (LOPRESSOR) 25 MG tablet Take 3 tablets (75 mg total) by mouth daily.  q am and  q evening   . Multiple Vitamins-Minerals (MULTIVITAMIN WITH MINERALS) tablet Take 1 tablet by mouth daily.   . Multiple Vitamins-Minerals (OCUVITE PRESERVISION PO) Take 1 tablet by mouth daily.   . NON FORMULARY Take 10 mg by mouth every 4 (four) hours. 06/29/2015: [place] [employer]  . polycarbophil (FIBERCON) 625 MG tablet Take 1,875 mg by mouth daily.    No facility-administered encounter medications on file as of 06/29/2015.    Allergies  Allergen Reactions  . Ciprofloxacin Hives    Got hives after 10 days of both cipro and flagyl, not sure which caused the hives  . Flagyl [Metronidazole] Hives    Got hives after 10 days of both cipro and flagyl, not sure which caused the hives  . Morphine And Related Nausea And Vomiting   ROS: no fever, chills.  +weight loss of 13# since last visit. +scabbed/non-healing lesion right chest. Denies nausea, vomiting, diarrhea, rashes, bleeding. See HPI  PHYSICAL EXAM: BP 112/62 mmHg  Pulse 64  Temp(Src) 97.7 F (36.5 C) (Tympanic)  Resp 20  Ht 5' 4.25" (1.632 m)  Wt 145 lb 12.8 oz (66.134 kg)  BMI 24.83 kg/m2  SpO2 96%   Accompanied by her daughter Mindi Junker today  Elderly female, with frequent dry cough. Speaking in full sentences without distress HEENT PERRL, EOMI, conjunctiva clear. TM's and EAC's normal. Nasal mucosa is mildly edematous with clear mucus. Sinuses are nontender, OP is clear Neck: no lymphadenopathy or mass Heart: regular rate and rhythm without murmur Lungs: clear bilaterally, no wheezes, rales, ronchi.  Only fair air movement. Improved air movement after nebulizer treatment.  Still no wheezes, rales, ronchi. resp rate unchanged after nebulizer Skin: normal turgor. Scabbed lesion right anterior chest Neuro: alert and oriented, cranial nerves intact. Normal strength, gait  ASSESSMENT/PLAN:  Acute upper respiratory infection - supportive measures. Had benefit from nebulizer, never used MDI, so will treat with short course prednisone. risks/side effects reviewed.  Cough - Plan: albuterol (PROVENTIL) (2.5 MG/3ML) 0.083% nebulizer solution 2.5 mg, predniSONE (DELTASONE) 20 MG tablet, DG Chest 2 View  Dyspnea - check CXR; long h/o smoking, eval for COPD changes, other pulm abnl, infection - Plan: DG Chest 2 View  Skin lesion of  chest wall - recommended follow up with dermatologist for evaluation and possible biopsy (very close to pacemaker site)    Prednisone  BID--risks and side effects reviewed in detail. F/u next week if persistent/worsening symptoms, sooner prn.    Take the medication as prescribed. Go to [facility] for chest x-ray Continue the cough medication you have (similar to robitussin DM). Call us for a prescription of tessalon if your cough isn't  improving with these measures. Please contact us and be re-evaluated if you develop fever, discolored mucus, increased shortness of breath, chest pain, or any other new symptoms or problems.

## 2015-06-29 NOTE — Patient Instructions (Addendum)
  Take the medication as prescribed. Go to [facility] for chest x-ray Continue the cough medication you have (similar to robitussin DM). Call us for a prescription of tessalon if your cough isn't improving with these measures. Please contact us and be re-evaluated if you develop fever, discolored mucus, increased shortness of breath, chest pain, or any other new symptoms or problems.  Coricidin HBP is a medication that might help with your runny nose and congestion.  You may use this in addition to the cough syrup you have.

## 2015-06-30 ENCOUNTER — Telehealth: Payer: Self-pay | Admitting: Family Medicine

## 2015-06-30 DIAGNOSIS — R05 Cough: Secondary | ICD-10-CM

## 2015-06-30 DIAGNOSIS — R059 Cough, unspecified: Secondary | ICD-10-CM

## 2015-06-30 MED ORDER — BENZONATATE 200 MG PO CAPS: 200.0000 mg | ORAL_CAPSULE | Freq: Three times a day (TID) | ORAL | Status: DC | PRN

## 2015-06-30 NOTE — Telephone Encounter (Signed)
Daughter called & does want you to prescribe some cough medicine and send to [facility] pharmacy

## 2015-06-30 NOTE — Telephone Encounter (Signed)
Done. Please advise 

## 2015-06-30 NOTE — Telephone Encounter (Signed)
Patient advised.

## 2015-07-13 ENCOUNTER — Ambulatory Visit (INDEPENDENT_AMBULATORY_CARE_PROVIDER_SITE_OTHER): Payer: Medicare HMO | Admitting: Family Medicine

## 2015-07-13 ENCOUNTER — Encounter: Payer: Self-pay | Admitting: Family Medicine

## 2015-07-13 VITALS — BP 126/70 | HR 60 | Ht 64.25 in | Wt 146.2 lb

## 2015-07-13 DIAGNOSIS — R634 Abnormal weight loss: Secondary | ICD-10-CM | POA: Diagnosis not present

## 2015-07-13 DIAGNOSIS — Z5181 Encounter for therapeutic drug level monitoring: Secondary | ICD-10-CM

## 2015-07-13 DIAGNOSIS — I10 Essential (primary) hypertension: Secondary | ICD-10-CM | POA: Diagnosis not present

## 2015-07-13 DIAGNOSIS — N289 Disorder of kidney and ureter, unspecified: Secondary | ICD-10-CM

## 2015-07-13 DIAGNOSIS — E781 Pure hyperglyceridemia: Secondary | ICD-10-CM | POA: Diagnosis not present

## 2015-07-13 MED ORDER — FUROSEMIDE 40 MG PO TABS: ORAL_TABLET | ORAL | Status: DC

## 2015-07-13 MED ORDER — GEMFIBROZIL 600 MG PO TABS: ORAL_TABLET | ORAL | Status: DC

## 2015-07-13 MED ORDER — METOPROLOL TARTRATE 25 MG PO TABS: 75.0000 mg | ORAL_TABLET | Freq: Every day | ORAL | Status: DC

## 2015-07-13 NOTE — Progress Notes (Signed)
Chief Complaint  Patient presents with  . Med check    fasting med check. Over the last six her months she has not had much of an appetite and has lost some weight. Also asking for handicapped- has macular degeneration and has issues with footing, uses cane and stumbles a lot and daughter would like to have.   . Flu Vaccine    doesn't want you to be mad at her but she doesn't really want one.    Seen recently with URI, some tachypnea. Treated with short course of prednisone. Her symptoms completely resolved.   She lost 15# in the last 6 months. Looks ike she lost 6# related to illness, but lost another 15# after the physical, when she had been improving.  She had a bout of diverticulitis in August. She eats small amounts, doesn't seem to be very hungry. Daughter is asking about supplements/shakes.  CXR showed: FINDINGS: Pacing device is again seen and stable. The cardiac shadow is stable. Aortic calcifications are again seen. The lungs are well aerated bilaterally. No focal infiltrate or sizable effusion is seen. Degenerative change of the thoracic spine is noted. IMPRESSION: No acute abnormality noted.  Hypertension follow-up: Blood pressures are occasionally checked elsewhere (if feels bad), in 120-130's/70's. Denies dizziness, headaches, chest pain. Denies side effects of medications. She has been taking lasix daily for many years. On days she doesn't take it (due to travel) she has recurrent swelling in her feet. Denies any shortness of breath, muscle cramps.  Hypertriglyceridemia: She is compliant  With her gemfibrozil , taking it just once daily (previously took fenofibrate, changed due to cost).  Reports she follows a lowfat diet.  She denies any side effects.  Pacemaker--getting regular pacemaker checks through cardiologist.  She reports she has follow up with cardiologist next month. No complications or symptoms, but they are keeping an eye on the leads.  Spot on  chest is improving significantly--had been cleaning regularly with peroxide.  Stopped it and using antibacterial ointment now.  Questions whether she needs mammograms.  Recent constipation/impacrtion, required enema  She has vision loss due to macular degeneration, and daughter is very concerned about tripping, fall risks.  They bought her a cane, but she doesn't use it properly. Also has a walker in the home, that she doesn't use.  Reports they have handles in the shower, raised toilet seat, and other safety measures, and have removed the rugs.   PMH, PSH, SH reviewed Outpatient Encounter Prescriptions as of 07/13/2015  Medication Sig Note  . aspirin 81 MG tablet Take 81 mg by mouth daily.   . calcium carbonate (OS-CAL) 600 MG TABS Take 630 mg by mouth daily.  01/05/2015: With D, one daily  . furosemide (LASIX) 40 MG tablet TAKE ONE (1) TABLET BY MOUTH EVERY DAY   . gemfibrozil (LOPID) 600 MG tablet TAKE ONE (1) TABLET BY MOUTH EVERY DAY BEFORE BREAKFAST   . metoprolol tartrate (LOPRESSOR) 25 MG tablet Take 3 tablets (75 mg total) by mouth daily.  q am and  q evening   . Multiple Vitamins-Minerals (MULTIVITAMIN WITH MINERALS) tablet Take 1 tablet by mouth daily.   . Multiple Vitamins-Minerals (OCUVITE PRESERVISION PO) Take 1 tablet by mouth daily.   . polycarbophil (FIBERCON) 625 MG tablet Take 1,875 mg by mouth daily.   . [DISCONTINUED] furosemide (LASIX) 40 MG tablet TAKE ONE (1) TABLET BY MOUTH EVERY DAY   . [DISCONTINUED] gemfibrozil (LOPID) 600 MG tablet TAKE ONE (1) TABLET BY MOUTH EVERY  DAY BEFORE BREAKFAST   . [DISCONTINUED] metoprolol tartrate (LOPRESSOR) 25 MG tablet Take 3 tablets (75 mg total) by mouth daily.  q am and  q evening   . [DISCONTINUED] benzonatate (TESSALON) 200 MG capsule Take 1 capsule (200 mg total) by mouth 3 (three) times daily as needed.   . [DISCONTINUED] NON FORMULARY Take 10 mg by mouth every 4 (four) hours. Reported on 07/13/2015 06/29/2015:  [employer]  . [DISCONTINUED] predniSONE (DELTASONE) 20 MG tablet Take 1 tablet (20 mg total) by mouth 2 (two) times daily.    No facility-administered encounter medications on file as of 07/13/2015.   Allergies  Allergen Reactions  . Ciprofloxacin Hives    Got hives after 10 days of both cipro and flagyl, not sure which caused the hives  . Flagyl [Metronidazole] Hives    Got hives after 10 days of both cipro and flagyl, not sure which caused the hives  . Morphine And Related Nausea And Vomiting   ROS: no fever, chills, URI symptoms and dyspnea resolved. No chest pain, palpitations, shortness of breath, nausea, vomiting, abdominal pain. +constipation with recent impaction, resolved.  No dysuria or other urinary complaints.  +vision loss due to macular degeneration.  See HPI.  No bleeding, bruising, rash or other concerns.  PHYSICAL EXAM: BP 126/70 mmHg  Pulse 60  Ht 5' 4.25" (1.632 m)  Wt 146 lb 3.2 oz (66.316 kg)  BMI 24.90 kg/m2  Elderly female in no distress.  Dismissing many of her daughters concerns. HEENT: PERRL, EOI, conjunctiva and sclera are clear. Neck: no lymphadenopathy, thyromegaly or mass Heart: regular rate and rhythm, no significant murmur noted Lungs: clear bilateraly, fair air movement. Extremities: no edema, normal pulses. Skin: normal turgor. Llesion right anterior chest is no longer scabbed, is healing. Neuro: alert and oriented, cranial nerves intact. Normal strength, gait  ASSESSMENT/PLAN:  Pure hyperglyceridemia - Plan: Lipid panel, gemfibrozil (LOPID) 600 MG tablet  Essential hypertension, benign - Plan: Comprehensive metabolic panel, metoprolol tartrate (LOPRESSOR) 25 MG tablet, furosemide (LASIX) 40 MG tablet  Renal insufficiency - Plan: Comprehensive metabolic panel  Essential hypertension, benign - controlled - Plan: Comprehensive metabolic panel, metoprolol tartrate (LOPRESSOR) 25 MG tablet, furosemide (LASIX) 40 MG tablet  Loss of weight  - Plan: TSH  Medication monitoring encounter - Plan: Comprehensive metabolic panel, Lipid panel   Ok for [facility] Uses assistive device due to fall risk from decreased vision.   Strongly encouraged PT to properly use assistive devices Declines. Offered safety eval--sounds like home has already been fixed up (raised toilet seat, bar in the shower, no longer has any rugs), and she would not want anyone else to come to the house..  Fall risk Daughter will contact us (or pt) if need arises or she changes her mind.   Make sure you eat 3 small meals and 2 snacks daily. Make sure to have protein with your meals and snacks (yogurt, peanut butter). If you aren't hungry for a meal or snack, substitute an Ensure shake or something similar. Our goal is to maintain your current weight.  Return in 2 months, weight check

## 2015-07-13 NOTE — Patient Instructions (Signed)
  Make sure you eat 3 small meals and 2 snacks daily. Make sure to have protein with your meals and snacks (yogurt, peanut butter). If you aren't hungry for a meal or snack, substitute an Ensure shake or something similar. Our goal is to maintain your current weight.  Be sure to drink plenty of fluids, and eat a high fiber diet, to help control your constipation.   Return in 2 months

## 2015-07-14 ENCOUNTER — Encounter: Payer: Self-pay | Admitting: Family Medicine

## 2015-07-14 LAB — COMPREHENSIVE METABOLIC PANEL
ALBUMIN: 3.6 g/dL (ref 3.6–5.1)
ALK PHOS: 69 U/L (ref 33–130)
ALT: 6 U/L (ref 6–29)
AST: 24 U/L (ref 10–35)
BILIRUBIN TOTAL: 0.8 mg/dL (ref 0.2–1.2)
BUN: 24 mg/dL (ref 7–25)
CALCIUM: 10.2 mg/dL (ref 8.6–10.4)
CHLORIDE: 103 mmol/L (ref 98–110)
CO2: 25 mmol/L (ref 20–31)
CREATININE: 1 mg/dL — AB (ref 0.60–0.88)
Glucose, Bld: 90 mg/dL (ref 65–99)
Potassium: 4.4 mmol/L (ref 3.5–5.3)
Sodium: 143 mmol/L (ref 135–146)
TOTAL PROTEIN: 6.4 g/dL (ref 6.1–8.1)

## 2015-07-14 LAB — LIPID PANEL
CHOL/HDL RATIO: 2.6 ratio (ref <=5.0)
CHOLESTEROL: 138 mg/dL (ref 125–200)
HDL: 54 mg/dL (ref >=46)
LDL Cholesterol: 58 mg/dL (ref <130)
TRIGLYCERIDES: 128 mg/dL (ref <150)
VLDL: 26 mg/dL (ref <30)

## 2015-07-14 LAB — TSH: TSH: 2.12 mIU/L

## 2015-09-06 ENCOUNTER — Ambulatory Visit (INDEPENDENT_AMBULATORY_CARE_PROVIDER_SITE_OTHER): Payer: Medicare HMO | Admitting: Cardiovascular Disease

## 2015-09-06 VITALS — BP 122/72 | HR 67 | Ht 64.0 in | Wt 143.0 lb

## 2015-09-06 DIAGNOSIS — I10 Essential (primary) hypertension: Secondary | ICD-10-CM

## 2015-09-06 DIAGNOSIS — E785 Hyperlipidemia, unspecified: Secondary | ICD-10-CM | POA: Diagnosis not present

## 2015-09-06 DIAGNOSIS — Z950 Presence of cardiac pacemaker: Secondary | ICD-10-CM

## 2015-09-06 DIAGNOSIS — I471 Supraventricular tachycardia: Secondary | ICD-10-CM | POA: Diagnosis not present

## 2015-09-06 MED ORDER — METOPROLOL TARTRATE 25 MG PO TABS: 25.0000 mg | ORAL_TABLET | Freq: Two times a day (BID) | ORAL | Status: DC

## 2015-09-06 NOTE — Patient Instructions (Addendum)
Your physician has recommended you make the following change in your medication:  1- STOP taking Gemfibrozil. 2- START taking Metoprolol 25 mg by mouth twice a day.   Remote monitoring is used to monitor your Pacemaker of ICD from home. This monitoring reduces the number of office visits required to check your device to one time per year. It allows us to keep an eye on the functioning of your device to ensure it is working properly. You are scheduled for a device check from home on December 07, 2015. You may send your transmission at any time that day. If you have a wireless device, the transmission will be sent automatically. After your physician reviews your transmission, you will receive a postcard with your next transmission date.   Your physician wants you to follow-up in: 1 YEAR with Dr Royann Shiversroitoru. You will receive a reminder letter in the mail two months in advance. If you don't receive a letter, please call our office to schedule the follow-up appointment.

## 2015-09-06 NOTE — Progress Notes (Signed)
Patient ID: [name], female   DOB: Jan 12, 1928, 82 y.o.   MRN: [id number]    Cardiology Office Note    Date:  09/08/2015   ID:  [name], DOB May 24, 1927, MRN [id number]  PCP:  Sherri Jumbo, MD  Cardiologist:   Sherri Fair, MD   Chief Complaint  Patient presents with  . Annual Exam  . Shortness of Breath  . Fatigue    History of Present Illness:  [name] is a 82 y.o. female with sinus node dysfunction which has progressed to atrial standstill. She has normal atrioventricular conduction. She is pacemaker dependent (atrial). She initially had a pacemaker implanted in 1994 and has subsequently had 2 generator change out procedures, most recently in 2007. She still has the original unipolar atrial and ventricular leads from 1994.   She does not have problems with syncope or palpitations. She does not have angina or dyspnea. She has lost 25 pounds since last year. Her appetite is poor. Vision is worsening due to macular degeneration. Has become more prone to stumbles and falls.  Interrogation of her dual-chamber Medtronic Adapta device shows an estimated generator longevity of 2.5 years. Her atrial and ventricular leads are old unipolar leads implanted in 1994. Occasional "noise" is seen especially on the atrial lead, possibly with occasional pacing inhibition. Virtually no noise is seen on the ventricular lead. The lead noise is reproducible with pocket manipulation and isometric exercises. The device has recorded occasional episodes of true atrial tachycardia lasting for a maximum of 1 minute. Many episodes of atrial rates over 400 bpm all represent noise. There is 96% atrial pacing and only 4% ventricular pacing. The sensed R waves remain rather mediocre.   Past Medical History  Diagnosis Date  . Sick sinus syndrome (HCC) with bradycardia  . Pacemaker     sees [name]) annually. Pacemaker replaced 2007 [name])  . Urge incontinence   . Hypertension   .  Hypertriglyceridemia   . Chest wall pain 2003    atypical, now resolved  . Diverticulitis     with DT flare in 6/09  . Macular degeneration     Dr. Sharlot Atkinson  . CKD (chronic kidney disease), stage III     Cr 1.27, GFR 40 (07/2011)  . Tobacco use disorder     quit 01/2013  . Gout     R hand    Past Surgical History  Procedure Laterality Date  . Pacemaker insertion  1994, 2000, 2007  . Cholecystectomy  1998    Dr. Gerrit Atkinson  . Appendectomy  1952  . Tubal ligation  1960  . Abdominal hysterectomy  1978    partial  . Eye surgery  11/2004    cataract surgery b/l - [name]  . Colon surgery  11/2009    vaginal-colon fistula repair ([name])  . Cardiac catheterization  2000  . Cystoscopy  2002    [name]    Current Medications: Outpatient Prescriptions Prior to Visit  Medication Sig Dispense Refill  . aspirin 81 MG tablet Take 81 mg by mouth daily.    . calcium carbonate (OS-CAL) 600 MG TABS Take 630 mg by mouth daily.     . furosemide (LASIX) 40 MG tablet TAKE ONE (1) TABLET BY MOUTH EVERY DAY 90 tablet 1  . Multiple Vitamins-Minerals (MULTIVITAMIN WITH MINERALS) tablet Take 1 tablet by mouth daily.    . Multiple Vitamins-Minerals (OCUVITE PRESERVISION PO) Take 1 tablet by mouth daily.    . polycarbophil (  FIBERCON) 625 MG tablet Take 1,875 mg by mouth daily.    Marland Kitchen. gemfibrozil (LOPID) 600 MG tablet TAKE ONE (1) TABLET BY MOUTH EVERY DAY BEFORE BREAKFAST 90 tablet 1  . metoprolol tartrate (LOPRESSOR) 25 MG tablet Take 3 tablets (75 mg total) by mouth daily. 50mg  q am and 25mg  q evening 270 tablet 1   No facility-administered medications prior to visit.     Allergies:   Ciprofloxacin; Flagyl; and Morphine and related   Social History   Social History  . Marital Status: Divorced    Spouse Name: N/A  . Number of Children: 4  . Years of Education: N/A   Occupational History  . retired Agricultural engineernursing assistant    Social History Main Topics  . Smoking status: Former  Smoker -- 0.30 packs/day for 40 years    Quit date: 01/12/2013  . Smokeless tobacco: Never Used     Comment: smokes 1 pack every 2-3 days. QUIT 01/2013; admits to periodically having a cigarette (06/2015)  . Alcohol Use: No  . Drug Use: No  . Sexual Activity: Not on file   Other Topics Concern  . Not on file   Social History Narrative   Lives alone, 1 cat.  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     Family History:  The patient's family history includes Alcohol abuse in her brother; Alzheimer's disease (age of onset: 6870) in her brother; Cancer in her other and sister; Crohn's disease in her daughter; Diabetes in her brother and mother; Heart disease in her brother, father, and mother; Heart disease (age of onset: 862) in her other; Hyperlipidemia in her father and mother; Hypertension in her father and mother; Stroke (age of onset: 8162) in her other; Ulcerative colitis in her daughter.   ROS:   Please see the history of present illness.    ROS All other systems reviewed and are negative.   PHYSICAL EXAM:   VS:  BP 122/72 mmHg  Pulse 67  Ht 5\' 4"  (1.626 m)  Wt 64.864 kg (143 lb)  BMI 24.53 kg/m2   GEN: Well nourished, well developed, in no acute distress HEENT: normal Neck: no JVD, carotid bruits, or masses Cardiac: RRR; no murmurs, rubs, or gallops,no edema , healthy appearing left subclavian pacemaker site Respiratory:  clear to auscultation bilaterally, normal work of breathing GI: soft, nontender, nondistended, + BS MS: no deformity or atrophy Skin: warm and dry, no rash Neuro:  Alert and Oriented x 3, Strength and sensation are intact Psych: euthymic mood, full affect  Wt Readings from Last 3 Encounters:  09/06/15 64.864 kg (143 lb)  07/13/15 66.316 kg (146 lb 3.2 oz)  06/29/15 66.134 kg (145 lb 12.8 oz)       Studies/Labs Reviewed:   EKG:  EKG is ordered today.  The ekg ordered today demonstrates atrial paced ventricular sensed, otherwise normal  Recent Labs: 12/13/2014: Hemoglobin 13.2; Platelets 245 07/13/2015: ALT 6; BUN 24; Creat 1.00*; Potassium 4.4; Sodium 143; TSH 2.12   Lipid Panel    Component Value Date/Time   CHOL 138 07/13/2015 0001   TRIG 128 07/13/2015 0001   HDL 54 07/13/2015 0001   CHOLHDL 2.6 07/13/2015 0001   VLDL 26 07/13/2015 0001   LDLCALC 58 07/13/2015 0001    ASSESSMENT:    1. Pacemaker - dual chamber,  Medtronic 2007, unipolar leads 1994   2. Essential hypertension, benign   3. PAT (paroxysmal atrial tachycardia) (HCC)   4. Hyperlipidemia      PLAN:  In order of problems listed above:  1. PM -  Despite some issues related to her old unipolar leads, device function is appropriate; continue remote downloads every 3 months 2. HTN - Excellent blood pressure control 3. PAT - Will start low-dose beta blocker for what appears to be increasing frequency of paroxysmal atrial tachycardia with one-to-one conduction 4. HLP - She has lost tremendous weight and her triglycerides are much lower than they were years ago. Would recommend stopping the gemfibrozil, which has very little scientific data for prevention of hard endpoints like myocardial infarction or death. Recheck triglycerides in a few minutes.    Medication Adjustments/Labs and Tests Ordered: Current medicines are reviewed at length with the patient today.  Concerns regarding medicines are outlined above.  Medication changes, Labs and Tests ordered today are listed in the Patient Instructions below. Patient Instructions  Your physician has recommended you make the following change in your medication:  1- STOP taking Gemfibrozil. 2- START taking Metoprolol 25 mg by mouth twice a day.   Remote monitoring is used to monitor your Pacemaker of ICD from home. This monitoring reduces the number of office  visits required to check your device to one time per year. It allows [place] to keep an eye on the functioning of your device to ensure it is working properly. You are scheduled for a device check from home on December 07, 2015. You may send your transmission at any time that day. If you have a wireless device, the transmission will be sent automatically. After your physician reviews your transmission, you will receive a postcard with your next transmission date.   Your physician wants you to follow-up in: 1 YEAR with Dr Royann Shivers. You will receive a reminder letter in the mail two months in advance. If you don't receive a letter, please call our office to schedule the follow-up appointment.         Joie Bimler, MD  09/08/2015 9:05 PM    [facility] [address] [place], [place], [place]  [address] Phone: [phone]; Fax: [phone]

## 2015-09-08 ENCOUNTER — Encounter: Payer: Self-pay | Admitting: Cardiovascular Disease

## 2015-09-08 DIAGNOSIS — I471 Supraventricular tachycardia: Secondary | ICD-10-CM | POA: Insufficient documentation

## 2015-09-08 DIAGNOSIS — I4719 Other supraventricular tachycardia: Secondary | ICD-10-CM | POA: Insufficient documentation

## 2015-09-08 DIAGNOSIS — E782 Mixed hyperlipidemia: Secondary | ICD-10-CM | POA: Insufficient documentation

## 2015-09-21 ENCOUNTER — Ambulatory Visit: Payer: Medicare HMO | Admitting: Family Medicine

## 2015-12-09 DIAGNOSIS — H353123 Nonexudative age-related macular degeneration, left eye, advanced atrophic without subfoveal involvement: Secondary | ICD-10-CM | POA: Diagnosis not present

## 2015-12-09 DIAGNOSIS — H353211 Exudative age-related macular degeneration, right eye, with active choroidal neovascularization: Secondary | ICD-10-CM | POA: Diagnosis not present

## 2015-12-26 DIAGNOSIS — H43813 Vitreous degeneration, bilateral: Secondary | ICD-10-CM | POA: Diagnosis not present

## 2015-12-26 DIAGNOSIS — H353123 Nonexudative age-related macular degeneration, left eye, advanced atrophic without subfoveal involvement: Secondary | ICD-10-CM | POA: Diagnosis not present

## 2015-12-26 DIAGNOSIS — H353211 Exudative age-related macular degeneration, right eye, with active choroidal neovascularization: Secondary | ICD-10-CM | POA: Diagnosis not present

## 2015-12-26 DIAGNOSIS — H35423 Microcystoid degeneration of retina, bilateral: Secondary | ICD-10-CM | POA: Diagnosis not present

## 2016-01-09 ENCOUNTER — Other Ambulatory Visit: Payer: Self-pay | Admitting: Family Medicine

## 2016-01-09 DIAGNOSIS — I10 Essential (primary) hypertension: Secondary | ICD-10-CM

## 2016-01-23 DIAGNOSIS — H353211 Exudative age-related macular degeneration, right eye, with active choroidal neovascularization: Secondary | ICD-10-CM | POA: Diagnosis not present

## 2016-01-23 DIAGNOSIS — H35423 Microcystoid degeneration of retina, bilateral: Secondary | ICD-10-CM | POA: Diagnosis not present

## 2016-01-23 DIAGNOSIS — H353124 Nonexudative age-related macular degeneration, left eye, advanced atrophic with subfoveal involvement: Secondary | ICD-10-CM | POA: Diagnosis not present

## 2016-01-23 DIAGNOSIS — H43813 Vitreous degeneration, bilateral: Secondary | ICD-10-CM | POA: Diagnosis not present

## 2016-02-29 NOTE — Progress Notes (Signed)
Chief Complaint  Patient presents with  . Annual Exam    fasting annual exam no gyn care (declined). No concerns.     [name] is a 82 y.o. female who presents for annual wellness visit and follow-up on chronic medical conditions. She is accompanied by Jackelyn Poling today.  She admits to starting smoking again, sometimes earlier this year.  She finds that she really doesn't have much else to enjoy, with her losing her hearing, vision, being unable to drive. One of her daughters smokes, and they tend to smoke more when together.  Hypertension follow-up: Blood pressures are occasionally checked by her daughter Jackelyn Poling, runs in 120's/70's. Denies dizziness, headaches, chest pain. Denies side effects of medications. She has been taking lasix daily for many years. On days she doesn't take it (due to travel) she has recurrent swelling in her feet. Denies any shortness of breath, muscle cramps. She was started on low dose beta blocker for PAT noted by cardiologist (started at last visit with cardiologist in April). Tolerating this without problems.  Hypertriglyceridemia: Dr. Recardo Evangelist stopped her Gemfibrozil at her visit in April. Due for recheck of TG.  (Had been taking gemfibrozil 654m once daily; prior to that took fenofibrate, changed due to cost).  Reports she follows a lowfat diet.   H/o gout in R hand--resolved. She hasn't had a recurrence in years, since she stopped baking.  She has vision loss due to macular degeneration. She is no longer driving. We previously discussed fall risks. There was concern that she doesn't use cane properly. They have handles in the shower, raised toilet seat, and other safety measures, and have removed the rugs. At last visit, we strongly encouraged PT to properly use assistive devices (declined). Offered safety eval, also declined--she would not want anyone else to come to the house. Doing well, no falls.  Nonhealing skin wound at right chest, present for  over a year. Denies pain, draining, not itchy, just isn't healing.  Tried neosporin.  This is located above the area of the pacemaker. Doesn't usually wear a bra, so that can't be blamed for chronic irritation. Daughter thinks she might pick at it some.   There is no immunization history on file for this patient. "I haven't taken any vaccines in a long, long time"--she refuses them all.  Last Pap smear: s/p hysterectomy; last in 2011; refuses pelvic exams Last mammogram: 09/2014; she decided not to have them anymore. Last colonoscopy: she believes it was 2009  Last DEXA: many years ago; didn't schedule the one we ordered last year and not interested in doing so Dentist:--doesn't go, has upper and lower dentures Ophtho: every 6 weeks to Dr. HIona Hansenfor injections; twice yearly to regular ophtho Exercise: Not much recently (just walks to the mailbox and back once daily (3070f. No weight-bearing exercise.  (previously walked up and down her driveway (1094-07inutes, sometimes 2-3 times/day)).   Other doctors caring for patient include: Cardiolologist: Dr. CrRecardo Evangelistphtho: Dr. KoPeter GarterDr. HaIona Hansenretinal specialist) GYN: Dr. ToGaetano Nethasn't seen him in over 5 years) GI: Can't recall, last seen in 2009  Depression screen: negative. No falls in the last year ADL screen--some trouble hearing/seeing and trouble going up stairs (gets winded on stairs, also has trouble/concerns due to vision). Refuses to get hearing aides. Some urine leakage, only rarely has trouble with bowel movements-- if very loose from a spicy meal. Wears Depends. See full questionnaires in epic.  End of Life Discussion:  Patient has a living  will and medical power of attorney  Past Medical History:  Diagnosis Date  . Chest wall pain 2003   atypical, now resolved  . CKD (chronic kidney disease), stage III    Cr 1.27, GFR 40 (07/2011)  . Diverticulitis    with DT flare in 6/09  . Gout    R hand  . Hypertension    . Hypertriglyceridemia   . Macular degeneration    Dr. Peter Garter  . Pacemaker    sees Dr. Recardo Evangelist [facility]) annually. Pacemaker replaced 2007 Tami Ribas)  . Sick sinus syndrome (HCC) with bradycardia  . Tobacco use disorder    quit 01/2013--restarted in 2017  . Urge incontinence     Past Surgical History:  Procedure Laterality Date  . ABDOMINAL HYSTERECTOMY  1978   partial  . APPENDECTOMY  1952  . CARDIAC CATHETERIZATION  2000  . CHOLECYSTECTOMY  1998   Dr. Harlow Asa  . COLON SURGERY  11/2009   vaginal-colon fistula repair (Drs. Danne Baxter)  . CYSTOSCOPY  2002   Dr. Reece Agar  . EYE SURGERY  11/2004   cataract surgery b/l - Dr.Bergen  . [place], 2000, 2007  . TUBAL LIGATION  1960    Social History   Social History  . Marital status: Divorced    Spouse name: N/A  . Number of children: 4  . Years of education: N/A   Occupational History  . retired Psychologist, counselling    Social History Main Topics  . Smoking status: Current Every Day Smoker    Packs/day: 0.50    Years: 40.00    Last attempt to quit: 01/12/2013  . Smokeless tobacco: Never Used     Comment: smokes 1/2 PPD, quite 01/2013, restarted in 2017; smokes more when her daughter who smokes visits  . Alcohol use No  . Drug use: No  . Sexual activity: Not Currently   Other Topics Concern  . Not on file   Social History Narrative   Lives alone, no pets (cat died).  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    Family History  Problem Relation Age of Onset  . Diabetes Mother   . Heart disease Mother   . Hyperlipidemia Mother   . Hypertension Mother   . Heart disease Father     pacemaker, CABG  . Hyperlipidemia Father   . Hypertension Father   . Cancer Sister     breast cancer 69's, then recurrence, metastatic, died at 15  . Diabetes Brother    . Alcohol abuse Brother   . Heart disease Brother   . Crohn's disease Daughter   . Alzheimer's disease Brother 20  . Ulcerative colitis Daughter   . Heart disease Other 62  . Stroke Other 62  . Cancer Other     breast    Outpatient Encounter Prescriptions as of 03/01/2016  Medication Sig Note  . aspirin 81 MG tablet Take 81 mg by mouth daily.   . calcium carbonate (OS-CAL) 600 MG TABS Take 630 mg by mouth daily.  01/05/2015: With D, one daily  . furosemide (LASIX) 40 MG tablet TAKE ONE (1) TABLET BY MOUTH EVERY DAY   . metoprolol tartrate (LOPRESSOR) 25 MG tablet Take 1 tablet (25 mg total) by mouth 2 (two) times daily.   . Multiple Vitamins-Minerals (MULTIVITAMIN WITH MINERALS) tablet  Take 1 tablet by mouth daily.   . Multiple Vitamins-Minerals (OCUVITE PRESERVISION PO) Take 1 tablet by mouth daily.   . polycarbophil (FIBERCON) 625 MG tablet Take 1,875 mg by mouth daily.    No facility-administered encounter medications on file as of 03/01/2016.     Allergies  Allergen Reactions  . Ciprofloxacin Hives    Got hives after 10 days of both cipro and flagyl, not sure which caused the hives  . Flagyl [Metronidazole] Hives    Got hives after 10 days of both cipro and flagyl, not sure which caused the hives  . Morphine And Related Nausea And Vomiting    ROS: The patient denies anorexia, fever, headaches, ear pain, sore throat, breast concerns, chest pain, palpitations, dizziness, syncope, dyspnea on exertion, cough, swelling, nausea, vomiting, diarrhea, constipation, abdominal pain, melena, hematochezia, indigestion/heartburn, hematuria, dysuria, vaginal bleeding, discharge, odor or itch, genital lesions, joint pains, numbness, tingling, weakness, tremor, depression, anxiety, abnormal bleeding/bruising, or enlarged lymph nodes. Some bruising when she bumps into things, stable, unchanged, and no other bleeding. Chronic hearing loss--refuses hearing aids. Shortness of breath if she walks  stairs or long distances. Weight stable over the last 6-8 months (lost prior to that, related to illness, no further losses).They think her scale might be off--they see some ongoing weight loss per home scale. Wt Readings from Last 3 Encounters:  03/01/16 144 lb 9.6 oz (65.6 kg)  09/06/15 143 lb (64.9 kg)  07/13/15 146 lb 3.2 oz (66.3 kg)   Urinary incontinence, some fecal incontinence if stools loose related to what she ate.  Wears Depends, unchanged.   PHYSICAL EXAM:  BP 120/70 (BP Location: Left Arm, Patient Position: Sitting, Cuff Size: Normal)   Pulse 72   Ht 5' 4"  (1.626 m)   Wt 144 lb 9.6 oz (65.6 kg)   BMI 24.82 kg/m   General Appearance:   Alert, cooperative, no distress, appears stated age. Declines getting change into a gown for exam.  Head:   Normocephalic, without obvious abnormality, atraumatic  Eyes:   PERRL, conjunctiva/corneas clear, EOM's intact, fundi not visualized. ptyerigium on the left, nasally  Ears:   Normal TM's and external ear canals  Nose:  Nares normal, mucosa is mildly edematous, no erythema or purulence, some clear mucus; no sinus tenderness  Throat:  Lips, mucosa, and tongue normal; edentulous with dentures. Visualized mucosa is normal.  Neck:  Supple, no lymphadenopathy; Thyroid: no enlargement/tenderness/nodules; no carotid bruit or JVD  Back:  Spine nontender, no curvature, ROM normal, no CVAtenderness  Lungs:   Clear to auscultation bilaterally without wheezes, rales orronchi; respirations unlabored  Chest Wall:   No tenderness or deformity. Pacemaker present in R upper chest wall  Heart:   Regular rate and rhythm, S1 and S2 normal, no murmur, rub or gallop  Breast Exam:   declined by patient.  Abdomen:   Soft, non-tender, nondistended, normoactive bowel sounds, no masses, no hepatosplenomegaly  Genitalia:   Declined by patient  Rectal:   Declined by patient  Extremities:  No clubbing, cyanosis  or edema  Pulses:  1+ and symmetric all extremities  Skin:  Skin color, texture, turgor normal, no rashes or lesions. Scattered angiomas and SK's.  Scabbed, hyperkeratotic area on the right chest, superior to the location of the pacemaker (1cm x 0.7cm, with central small ulceration.  Also has an area of ulceration/excoriation just superior and medial to this area  Lymph nodes:  Cervical, supraclavicular, and axillary nodes normal  Neurologic:  CNII-XII intact,  normal strength, sensation and gait; reflexes 2+ and symmetric throughout  Psych: Normal mood, affect, hygiene and grooming    ASSESSMENT/PLAN:  Medicare annual wellness visit, subsequent  Medication monitoring encounter - Plan: Comprehensive metabolic panel, CBC with Differential/Platelet  Essential hypertension, benign - controlled - Plan: Comprehensive metabolic panel  Pure hyperglyceridemia - stopped meds per Dr. C 6 months ago, due for recheck - Plan: Lipid panel  Renal insufficiency - Plan: Comprehensive metabolic panel  Nonhealing nonsurgical wound - right chest--encouraged biopsy (either here or dermatologist). Ddx reviewed.  Tobacco abuse - discussed in detail the risks, and reasons for quitting; resources reviewed   Counseled re: Tdap, pneumococcal vaccines (?if she ever had pneumovax, if so, still recommend Prevnar-13 now), yearly flu shots and zostavax. She declines all vaccines. Risks/benefits, reasons these are recommended was reviewed in detail. Advised that Tdap and Zostavax would come from ER, prevnar and flu shot can be given in the office; declines.  Discussed monthly self breast exams and yearly mammograms (declines); at least 30 minutes of aerobic activity at least 5 days/week, weight bearing exercise 2x/week; proper sunscreen use reviewed; healthy diet, including goals of calcium and vitamin D intake and alcohol recommendations (less than or equal to 1 drink/day) reviewed;  regular seatbelt use; changing batteries in smoke detectors. Immunization recommendations discussed as above--declines all. Colonoscopy recommendations reviewed, UTD. Doesn't need further screening DEXA recommended, declines  Encouraged biopsy of nonhealing skin lesion right chest. To return here or schedule visit with dermatologist.   Lipids, c-met, CBC today F/u yearly instead of q6 mos, sooner if having problems.  Full Code, Full Care. DNR if in persistent vegetative state.  Medicare Attestation I have personally reviewed: The patient's medical and social history Their use of alcohol, tobacco or illicit drugs Their current medications and supplements The patient's functional ability including ADLs,fall risks, home safety risks, cognitive, and hearing and visual impairment Diet and physical activities Evidence for depression or mood disorders  The patient's weight, height, and BMI have been recorded in the chart.  I have made referrals, counseling, and provided education to the patient based on review of the above and I have provided the patient with a written personalized care plan for preventive services.     Vernette Moise A, MD   02/29/2016

## 2016-03-01 ENCOUNTER — Ambulatory Visit (INDEPENDENT_AMBULATORY_CARE_PROVIDER_SITE_OTHER): Payer: Medicare HMO | Admitting: Family Medicine

## 2016-03-01 ENCOUNTER — Encounter: Payer: Self-pay | Admitting: Family Medicine

## 2016-03-01 VITALS — BP 120/70 | HR 72 | Ht 64.0 in | Wt 144.6 lb

## 2016-03-01 DIAGNOSIS — Z0000 Encounter for general adult medical examination without abnormal findings: Secondary | ICD-10-CM | POA: Diagnosis not present

## 2016-03-01 DIAGNOSIS — I10 Essential (primary) hypertension: Secondary | ICD-10-CM | POA: Diagnosis not present

## 2016-03-01 DIAGNOSIS — E781 Pure hyperglyceridemia: Secondary | ICD-10-CM

## 2016-03-01 DIAGNOSIS — N289 Disorder of kidney and ureter, unspecified: Secondary | ICD-10-CM | POA: Diagnosis not present

## 2016-03-01 DIAGNOSIS — Z720 Tobacco use: Secondary | ICD-10-CM

## 2016-03-01 DIAGNOSIS — Z5181 Encounter for therapeutic drug level monitoring: Secondary | ICD-10-CM

## 2016-03-01 DIAGNOSIS — T148XXA Other injury of unspecified body region, initial encounter: Secondary | ICD-10-CM | POA: Diagnosis not present

## 2016-03-01 LAB — COMPREHENSIVE METABOLIC PANEL
ALT: 6 U/L (ref 6–29)
AST: 21 U/L (ref 10–35)
Albumin: 4 g/dL (ref 3.6–5.1)
Alkaline Phosphatase: 54 U/L (ref 33–130)
BUN: 17 mg/dL (ref 7–25)
CHLORIDE: 102 mmol/L (ref 98–110)
CO2: 31 mmol/L (ref 20–31)
Calcium: 10 mg/dL (ref 8.6–10.4)
Creat: 0.94 mg/dL — ABNORMAL HIGH (ref 0.60–0.88)
GLUCOSE: 85 mg/dL (ref 65–99)
Potassium: 4 mmol/L (ref 3.5–5.3)
Sodium: 141 mmol/L (ref 135–146)
Total Bilirubin: 0.6 mg/dL (ref 0.2–1.2)
Total Protein: 6.6 g/dL (ref 6.1–8.1)

## 2016-03-01 LAB — CBC WITH DIFFERENTIAL/PLATELET
BASOS ABS: 0 {cells}/uL (ref 0–200)
Basophils Relative: 0 %
Eosinophils Absolute: 198 cells/uL (ref 15–500)
Eosinophils Relative: 2 %
HEMATOCRIT: 41.9 % (ref 35.0–45.0)
HEMOGLOBIN: 14 g/dL (ref 11.7–15.5)
LYMPHS ABS: 3564 {cells}/uL (ref 850–3900)
Lymphocytes Relative: 36 %
MCH: 31.5 pg (ref 27.0–33.0)
MCHC: 33.4 g/dL (ref 32.0–36.0)
MCV: 94.2 fL (ref 80.0–100.0)
MONO ABS: 792 {cells}/uL (ref 200–950)
MPV: 12.3 fL (ref 7.5–12.5)
Monocytes Relative: 8 %
NEUTROS ABS: 5346 {cells}/uL (ref 1500–7800)
NEUTROS PCT: 54 %
Platelets: 202 10*3/uL (ref 140–400)
RBC: 4.45 MIL/uL (ref 3.80–5.10)
RDW: 13.8 % (ref 11.0–15.0)
WBC: 9.9 10*3/uL (ref 4.0–10.5)

## 2016-03-01 LAB — LIPID PANEL
CHOL/HDL RATIO: 3.9 ratio (ref <=5.0)
Cholesterol: 173 mg/dL (ref 125–200)
HDL: 44 mg/dL — ABNORMAL LOW (ref >=46)
LDL CALC: 82 mg/dL (ref <130)
Triglycerides: 234 mg/dL — ABNORMAL HIGH (ref <150)
VLDL: 47 mg/dL — AB (ref <30)

## 2016-03-01 NOTE — Patient Instructions (Signed)
HEALTH MAINTENANCE RECOMMENDATIONS:  It is recommended that you get at least 30 minutes of aerobic exercise at least 5 days/week (for weight loss, you may need as much as 60-90 minutes). This can be any activity that gets your heart rate up. This can be divided in 10-15 minute intervals if needed, but try and build up your endurance at least once a week.  Weight bearing exercise is also recommended twice weekly.  Eat a healthy diet with lots of vegetables, fruits and fiber.  "Colorful" foods have a lot of vitamins (ie green vegetables, tomatoes, red peppers, etc).  Limit sweet tea, regular sodas and alcoholic beverages, all of which has a lot of calories and sugar.  Up to 1 alcoholic drink daily may be beneficial for women (unless trying to lose weight, watch sugars).  Drink a lot of water.  Calcium recommendations are 1200-1500 mg daily (1500 mg for postmenopausal women or women without ovaries), and vitamin D 1000 IU daily.  This should be obtained from diet and/or supplements (vitamins), and calcium should not be taken all at once, but in divided doses.  Monthly self breast exams and yearly mammograms for women over the age of 12 is recommended.  Sunscreen of at least SPF 30 should be used on all sun-exposed parts of the skin when outside between the hours of 10 am and 4 pm (not just when at beach or pool, but even with exercise, golf, tennis, and yard work!)  Use a sunscreen that says "broad spectrum" so it covers both UVA and UVB rays, and make sure to reapply every 1-2 hours.  Remember to change the batteries in your smoke detectors when changing your clock times in the spring and fall.  Use your seat belt every time you are in a car, and please drive safely and not be distracted with cell phones and texting while driving.   [name] , Thank you for taking time to come for your Medicare Wellness Visit. I appreciate your ongoing commitment to your health goals. Please review the following  plan we discussed and let me know if I can assist you in the future.   These are the goals we discussed: Goals    None      This is a list of the screening recommended for you and due dates:  Health Maintenance  Topic Date Due  . Tetanus Vaccine  12/15/1946  . Shingles Vaccine  12/15/1989  . Pneumonia vaccines (1 of 2 - PCV13) 12/16/1994  . Flu Shot  12/13/2015  . DEXA scan (bone density measurement)  Completed   I strongly encourage you to get Tdap (tetanus/pertussis) vaccine, along with pneumonia vaccine, flu shot, and consider shingles shot. The tetanus and shingles need to come from the pharmacy, I can give you the others here in the office, if desired. You no longer need pap smears, colonoscopy.  I'm concerned about the nonhealing area that has been on your chest for over a year. This should be biopsied--schedule either a visit with a dermatologist, or return here for biopsy.   I encourage yearly mammograms, monthly self breast exams. I encourage you to have a bone density test.  Please try and quit smoking--start thinking about why/when you smoke (habit, boredom, stress) in order to come up with effective strategies to cut back or quit. Available resources to help you quit include free counseling through [facility] Quitline ([place]Quitline.com or 1-800-QUITNOW), smoking cessation classes through [facility] (call to find out schedule),  over-the-counter nicotine replacements, and e-cigarettes (although this may not help break the hand-mouth habit).  Many insurance companies also have smoking cessation programs (which may decrease the cost of patches, meds if enrolled).  If these methods are not effective for you, and you are motivated to quit, return to discuss the possibility of prescription medications.

## 2016-03-05 DIAGNOSIS — H353211 Exudative age-related macular degeneration, right eye, with active choroidal neovascularization: Secondary | ICD-10-CM | POA: Diagnosis not present

## 2016-03-05 DIAGNOSIS — H43813 Vitreous degeneration, bilateral: Secondary | ICD-10-CM | POA: Diagnosis not present

## 2016-03-05 DIAGNOSIS — H353124 Nonexudative age-related macular degeneration, left eye, advanced atrophic with subfoveal involvement: Secondary | ICD-10-CM | POA: Diagnosis not present

## 2016-03-05 DIAGNOSIS — H35423 Microcystoid degeneration of retina, bilateral: Secondary | ICD-10-CM | POA: Diagnosis not present

## 2016-03-07 ENCOUNTER — Ambulatory Visit (INDEPENDENT_AMBULATORY_CARE_PROVIDER_SITE_OTHER): Payer: Medicare HMO | Admitting: *Deleted

## 2016-03-07 DIAGNOSIS — I495 Sick sinus syndrome: Secondary | ICD-10-CM | POA: Diagnosis not present

## 2016-03-08 ENCOUNTER — Encounter: Payer: Self-pay | Admitting: Cardiology

## 2016-03-08 NOTE — Progress Notes (Signed)
Remote pacemaker transmission.   

## 2016-03-19 ENCOUNTER — Other Ambulatory Visit: Payer: Self-pay | Admitting: Family Medicine

## 2016-03-19 DIAGNOSIS — I10 Essential (primary) hypertension: Secondary | ICD-10-CM

## 2016-03-23 ENCOUNTER — Encounter: Payer: Self-pay | Admitting: Cardiology

## 2016-04-04 LAB — CUP PACEART REMOTE DEVICE CHECK
Battery Impedance: 1638 Ohm
Battery Voltage: 2.75 V
Brady Statistic AP VS Percent: 92 %
Brady Statistic AS VP Percent: 0 %
Date Time Interrogation Session: 20171025154317
Implantable Lead Implant Date: 19940815
Implantable Lead Location: 753859
Implantable Pulse Generator Implant Date: 20071015
Lead Channel Pacing Threshold Amplitude: 0.5 V
Lead Channel Pacing Threshold Pulse Width: 0.4 ms
Lead Channel Pacing Threshold Pulse Width: 0.4 ms
Lead Channel Setting Pacing Amplitude: 2.25 V
Lead Channel Setting Pacing Pulse Width: 0.4 ms
MDC IDC LEAD IMPLANT DT: 19940815
MDC IDC LEAD LOCATION: 753860
MDC IDC MSMT BATTERY REMAINING LONGEVITY: 33 mo
MDC IDC MSMT LEADCHNL RA IMPEDANCE VALUE: 395 Ohm
MDC IDC MSMT LEADCHNL RV IMPEDANCE VALUE: 433 Ohm
MDC IDC MSMT LEADCHNL RV PACING THRESHOLD AMPLITUDE: 1.125 V
MDC IDC SET LEADCHNL RA PACING AMPLITUDE: 1.5 V
MDC IDC SET LEADCHNL RV SENSING SENSITIVITY: 2 mV
MDC IDC STAT BRADY AP VP PERCENT: 2 %
MDC IDC STAT BRADY AS VS PERCENT: 6 %

## 2016-04-30 DIAGNOSIS — H43813 Vitreous degeneration, bilateral: Secondary | ICD-10-CM | POA: Diagnosis not present

## 2016-04-30 DIAGNOSIS — H353211 Exudative age-related macular degeneration, right eye, with active choroidal neovascularization: Secondary | ICD-10-CM | POA: Diagnosis not present

## 2016-04-30 DIAGNOSIS — H353124 Nonexudative age-related macular degeneration, left eye, advanced atrophic with subfoveal involvement: Secondary | ICD-10-CM | POA: Diagnosis not present

## 2016-04-30 DIAGNOSIS — H35423 Microcystoid degeneration of retina, bilateral: Secondary | ICD-10-CM | POA: Diagnosis not present

## 2016-06-07 ENCOUNTER — Ambulatory Visit (INDEPENDENT_AMBULATORY_CARE_PROVIDER_SITE_OTHER): Payer: Medicare HMO | Admitting: *Deleted

## 2016-06-07 DIAGNOSIS — I495 Sick sinus syndrome: Secondary | ICD-10-CM | POA: Diagnosis not present

## 2016-06-07 NOTE — Progress Notes (Signed)
Remote pacemaker transmission.   

## 2016-06-08 ENCOUNTER — Encounter: Payer: Self-pay | Admitting: Cardiology

## 2016-06-09 LAB — CUP PACEART REMOTE DEVICE CHECK
Battery Impedance: 1750 Ohm
Battery Remaining Longevity: 31 mo
Brady Statistic AS VS Percent: 5 %
Implantable Lead Implant Date: 19940815
Implantable Lead Location: 753859
Lead Channel Impedance Value: 346 Ohm
Lead Channel Setting Pacing Amplitude: 1.5 V
Lead Channel Setting Pacing Amplitude: 2 V
Lead Channel Setting Sensing Sensitivity: 2 mV
MDC IDC LEAD IMPLANT DT: 19940815
MDC IDC LEAD LOCATION: 753860
MDC IDC MSMT BATTERY VOLTAGE: 2.75 V
MDC IDC MSMT LEADCHNL RA IMPEDANCE VALUE: 375 Ohm
MDC IDC MSMT LEADCHNL RA PACING THRESHOLD AMPLITUDE: 0.75 V
MDC IDC MSMT LEADCHNL RA PACING THRESHOLD PULSEWIDTH: 0.4 ms
MDC IDC MSMT LEADCHNL RV PACING THRESHOLD AMPLITUDE: 0.875 V
MDC IDC MSMT LEADCHNL RV PACING THRESHOLD PULSEWIDTH: 0.4 ms
MDC IDC PG IMPLANT DT: 20071015
MDC IDC SESS DTM: 20180125141050
MDC IDC SET LEADCHNL RV PACING PULSEWIDTH: 0.4 ms
MDC IDC STAT BRADY AP VP PERCENT: 2 %
MDC IDC STAT BRADY AP VS PERCENT: 92 %
MDC IDC STAT BRADY AS VP PERCENT: 0 %

## 2016-06-11 DIAGNOSIS — R05 Cough: Secondary | ICD-10-CM | POA: Diagnosis not present

## 2016-06-11 DIAGNOSIS — J449 Chronic obstructive pulmonary disease, unspecified: Secondary | ICD-10-CM | POA: Diagnosis not present

## 2016-06-14 ENCOUNTER — Other Ambulatory Visit: Payer: Self-pay | Admitting: *Deleted

## 2016-06-14 ENCOUNTER — Telehealth: Payer: Self-pay

## 2016-06-14 DIAGNOSIS — I10 Essential (primary) hypertension: Secondary | ICD-10-CM

## 2016-06-14 MED ORDER — FUROSEMIDE 40 MG PO TABS
ORAL_TABLET | ORAL | 0 refills | Status: DC
Start: 2016-06-14 — End: 2016-10-05

## 2016-06-14 NOTE — Telephone Encounter (Signed)
Pt [facility] [address]. [name]/RLB

## 2016-06-22 ENCOUNTER — Encounter: Payer: Self-pay | Admitting: Cardiology

## 2016-07-09 DIAGNOSIS — H43823 Vitreomacular adhesion, bilateral: Secondary | ICD-10-CM | POA: Diagnosis not present

## 2016-07-09 DIAGNOSIS — H43813 Vitreous degeneration, bilateral: Secondary | ICD-10-CM | POA: Diagnosis not present

## 2016-07-09 DIAGNOSIS — H353211 Exudative age-related macular degeneration, right eye, with active choroidal neovascularization: Secondary | ICD-10-CM | POA: Diagnosis not present

## 2016-07-09 DIAGNOSIS — H353124 Nonexudative age-related macular degeneration, left eye, advanced atrophic with subfoveal involvement: Secondary | ICD-10-CM | POA: Diagnosis not present

## 2016-07-18 ENCOUNTER — Telehealth: Payer: Self-pay | Admitting: Cardiovascular Disease

## 2016-07-18 DIAGNOSIS — R0602 Shortness of breath: Secondary | ICD-10-CM

## 2016-07-18 NOTE — Telephone Encounter (Signed)
Left message to call back  

## 2016-07-18 NOTE — Telephone Encounter (Signed)
New message    Pt c/o Shortness Of Breath: STAT if SOB developed within the last 24 hours or pt is noticeably SOB on the phone  1. Are you currently SOB (can you hear that pt is SOB on the phone)? Pt daughter called  2. How long have you been experiencing SOB? 2 weeks-the last week seems worse  3. Are you SOB when sitting or when up moving around? Any time she does any activity.  4. Are you currently experiencing any other symptoms? Pt sometimes complains of being dizzy.   Pt daughter states she looks fraile. Appt was scheduled for March 22nd with Corine ShelterLuke Kilroy.

## 2016-07-19 ENCOUNTER — Telehealth: Payer: Self-pay | Admitting: Cardiovascular Disease

## 2016-07-19 NOTE — Telephone Encounter (Signed)
Spoke with pts daughter regarding remote transmission, she stated that they had done, informed her that it didn't come through, pts daughter stated that she was not at the patients house and would send one in tomorrow about 2:00pm. Informed pts daughter that the device clinic would look for the transmission and give a call back.

## 2016-07-19 NOTE — Telephone Encounter (Signed)
New Message      Please call she is returning your call about her mother having SOB

## 2016-07-19 NOTE — Telephone Encounter (Signed)
Spoke w daughter, who takes care of patient and is also ICU RN w [facility]. She notes patient has had progressive shortness of breath over a period of about 2 weeks. SOB occurs with ambulation/activity, mild but then worse with more prolonged activity.   Notes pt not having any signs of pitting edema, no rales, chest congestion, etc. Reports in fact that patient has had about a 20 lb weight loss over last 3 months accompanied with some gradual weakness.  Pt PM dependent - PAT. SSS.  Daughter notes rate usually at 5270. She does not identify any episodes of arrythmia, rapid pulse, no thready or irregular pulse when checked. BPs stable when checked at 110-120/70-80.  Scheduler initially gave her an appt for 3/22, after discussion thought appropriate to move her up to a sooner availability, patient to see Wynema BirchHao on 3/14 but I advised daughter to call sooner if she reports changes/new symptoms.  I've asked for her to send a remote PM upload today/tomorrow so that we can note any changes since last checked - she will do this.  If emergent symptoms, if shortness of breath at rest, daughter will take patient to ED for evaluation.

## 2016-07-19 NOTE — Telephone Encounter (Signed)
Agree with that plan. Thanks.  Dr. HRexene Edison

## 2016-07-20 NOTE — Telephone Encounter (Signed)
[id number] 

## 2016-07-23 NOTE — Telephone Encounter (Signed)
Thank you MCr 

## 2016-07-23 NOTE — Telephone Encounter (Signed)
Thurmon FairMihai Croitoru, MD at 07/23/2016 12:27 PM   Status: Signed    No problems seen with device/leads or with any recent rhythm abnormalities on pacemaker check.    Dorathy DaftElizabeth H Watts, RN at 07/19/2016 4:45 PM   Status: Signed    Spoke with pts daughter regarding remote transmission, she stated that they had done, informed her that it didn't come through, pts daughter stated that she was not at the patients house and would send one in tomorrow about 2:00pm. Informed pts daughter that the device clinic would look for the transmission and give a call back.

## 2016-07-23 NOTE — Telephone Encounter (Signed)
Remote transmission reviewed from 07/20/16. Presenting rhythm: ApVs, Ap: 94.2%, Vp: 3.1%. <0.1% AT/AF burden --max dur. 1mins 33 sec, last 02/05/16. (36) VHR episodes-last 07/04/16; AT. Stable lead measurements. Estimated longevity 2.5 years.  Will forward to Dr.Croitoru.

## 2016-07-23 NOTE — Telephone Encounter (Signed)
No problems seen with device/leads or with any recent rhythm abnormalities on pacemaker check.

## 2016-07-23 NOTE — Telephone Encounter (Signed)
Returned call to patient She states she has been having shortness of breath for a while - she had SOB last year, but it is getting worse and worse. She gets winded walking from room to room  She has an appointment w/Hao, PA 07/25/16  She sent a pacemaker check in last week - will forward to device clinic -- she is concerned as her leads have been in for 24 years  Will send to Dr. Royann Shiversroitoru as Lorain ChildesFYI

## 2016-07-23 NOTE — Telephone Encounter (Signed)
Returned call to patient w/MD advice. She states she cannot come in tomorrow for an echo appt (if there even is one available). She will come see PA on 3/14.

## 2016-07-23 NOTE — Telephone Encounter (Signed)
Please schedule for an echo for dyspnea (preferably before office appt). I cannot yet see her last echo transmission, but looked OK in late January.

## 2016-07-25 ENCOUNTER — Encounter: Payer: Self-pay | Admitting: Physician Assistant

## 2016-07-25 ENCOUNTER — Ambulatory Visit (INDEPENDENT_AMBULATORY_CARE_PROVIDER_SITE_OTHER): Payer: Medicare HMO | Admitting: Physician Assistant

## 2016-07-25 VITALS — BP 108/64 | HR 64 | Wt 142.0 lb

## 2016-07-25 DIAGNOSIS — R0609 Other forms of dyspnea: Secondary | ICD-10-CM

## 2016-07-25 DIAGNOSIS — E785 Hyperlipidemia, unspecified: Secondary | ICD-10-CM

## 2016-07-25 DIAGNOSIS — I10 Essential (primary) hypertension: Secondary | ICD-10-CM

## 2016-07-25 DIAGNOSIS — I495 Sick sinus syndrome: Secondary | ICD-10-CM | POA: Diagnosis not present

## 2016-07-25 DIAGNOSIS — R0600 Dyspnea, unspecified: Secondary | ICD-10-CM

## 2016-07-25 DIAGNOSIS — N183 Chronic kidney disease, stage 3 unspecified: Secondary | ICD-10-CM

## 2016-07-25 DIAGNOSIS — R0602 Shortness of breath: Secondary | ICD-10-CM

## 2016-07-25 DIAGNOSIS — Z79899 Other long term (current) drug therapy: Secondary | ICD-10-CM

## 2016-07-25 LAB — CBC
HEMATOCRIT: 42.2 % (ref 35.0–45.0)
HEMOGLOBIN: 13.9 g/dL (ref 11.7–15.5)
MCH: 31.3 pg (ref 27.0–33.0)
MCHC: 32.9 g/dL (ref 32.0–36.0)
MCV: 95 fL (ref 80.0–100.0)
MPV: 12.4 fL (ref 7.5–12.5)
Platelets: 220 10*3/uL (ref 140–400)
RBC: 4.44 MIL/uL (ref 3.80–5.10)
RDW: 13.6 % (ref 11.0–15.0)
WBC: 9.9 10*3/uL (ref 3.8–10.8)

## 2016-07-25 NOTE — Patient Instructions (Signed)
Medication Instructions:  Your physician recommends that you continue on your current medications as directed. Please refer to the Current Medication list given to you today.  If you need a refill on your cardiac medications before your next appointment, please call your pharmacy.  Labwork: CBC TODAY AT [facility] ON THE 1ST FLOOR  Testing/Procedures: Your physician has requested that you have an echocardiogram. Echocardiography is a painless test that uses sound waves to create images of your heart. It provides your doctor with information about the size and shape of your heart and how well your heart's chambers and valves are working. This procedure takes approximately one hour. There are no restrictions for this procedure.  Follow-Up: Your physician wants you to follow-up in: 2 MONTHS WITH DR CROITORU OR IF ABNORMAL ECHO    Thank you for choosing [facility]!!    HAO MENG, PA-C ProspectMichelle, LPN

## 2016-07-25 NOTE — Progress Notes (Signed)
Cardiology Office Note    Date:  07/25/2016   ID:  [name], DOB July 30, 1927, MRN [id number]  PCP:  Lavonda Jumbo, MD  Cardiologist:  Dr. Royann Shivers  Chief Complaint  Patient presents with  . Follow-up    seen for Dr. Royann Shivers, pt c/o DOE, dizzy    History of Present Illness:  [name] is a 83 y.o. female with PMH of sinus node dysfunction which eventually progressed to atrial standstill, this was treated with pacemaker. She had normal atrioventricular conduction. She is atrial pacing dependent. She initially had a pacemaker placed in 1994 and subsequently had to generator change out procedures. Most recent was in 2007. She still had the original uterine polar atrial and ventricular leads from 1994. Otherwise she has stage III CKD, hypertension and hyperlipidemia. Based on the previous office note, occasional noise is seen especially on the atrial leads, possibly associated with occasional pacing inhibition. No noise is seen on the ventricular leads. The lead noise is reproducible with pocket manipulation and isometric exercises. She had echocardiogram in 2010 which showed normal EF, RVSP 30 mmHg. Myoview obtained in July 2010 was low risk without ischemia.   Her last office visit was on 09/06/2015, a low-dose metoprolol 25 mg twice a day was started. Since she has significant weight loss and improvement of triglyceride, her gemfibrozil was stopped. Her last interrogation was in January 2018, battery status was good, there was few episode of nonsustained atrial tachycardia with 1-1 AV conduction. There was one episode of possible atrial fibrillation lasting just over a minute in duration.   Patient presents today for evaluation of dyspnea on exertion. For the past year, she has been having worsening dyspnea associated with minimal exertion. Her daughter also mentioned she has lost roughly 30 pounds in the last year as well. Her PCP has recommended Ensure to help with her nutrition. She  has smoked for more than 40 years. She recently got a chest x-ray at [facility] urgent care in January that reportedly showed emphysema with no acute changes. We ambulated her in the hallway, her O2 saturation was 97% on initial arrival, with ambulation, her O2 saturation dipped down to the high 80s. She was transiently down to 87% for 1 second before bouncing back up to 89%. I do not think she needed home oxygen at this time. On physical exam, she does not have lower extremity edema or rale in the lung. She has no sign of volume overload. I do not think her symptom is caused by heart failure. I did recommend a CBC to make sure she is not anemic and also to repeat echocardiogram to make sure her ejection fraction is normal. If both are normal, I would recommend pulmonology referral as her symptom is most likely caused by chronic tobacco abuse.   Past Medical History:  Diagnosis Date  . Chest wall pain 2003   atypical, now resolved  . CKD (chronic kidney disease), stage III    Cr 1.27, GFR 40 (07/2011)  . Diverticulitis    with DT flare in 6/09  . Gout    R hand  . Hypertension   . Hypertriglyceridemia   . Macular degeneration    Dr. Sharlot Gowda  . Pacemaker    sees Dr. Rubie Maid [facility]) annually. Pacemaker replaced 2007 [facility])  . Sick sinus syndrome (HCC) with bradycardia  . Tobacco use disorder    quit 01/2013--restarted in 2017  . Urge incontinence     Past Surgical History:  Procedure Laterality Date  . ABDOMINAL HYSTERECTOMY  1978   partial  . APPENDECTOMY  1952  . CARDIAC CATHETERIZATION  2000  . CHOLECYSTECTOMY  1998   Dr. Gerrit Friends  . COLON SURGERY  11/2009   vaginal-colon fistula repair (Drs. Delane Ginger)  . CYSTOSCOPY  2002   Dr. Wanda Plump  . EYE SURGERY  11/2004   cataract surgery b/l - Dr.Bergen  . PACEMAKER INSERTION  1994, 2000, 2007  . TUBAL LIGATION  1960    Current Medications: Outpatient Medications Prior to Visit  Medication Sig Dispense Refill  . aspirin 81 MG  tablet Take 81 mg by mouth daily.    . calcium carbonate (OS-CAL) 600 MG TABS Take 630 mg by mouth daily.     . furosemide (LASIX) 40 MG tablet TAKE ONE (1) TABLET BY MOUTH EVERY DAY 90 tablet 0  . metoprolol tartrate (LOPRESSOR) 25 MG tablet Take 1 tablet (25 mg total) by mouth 2 (two) times daily. 60 tablet 11  . Multiple Vitamins-Minerals (MULTIVITAMIN WITH MINERALS) tablet Take 1 tablet by mouth daily.    . Multiple Vitamins-Minerals (OCUVITE PRESERVISION PO) Take 1 tablet by mouth daily.    . polycarbophil (FIBERCON) 625 MG tablet Take 1,875 mg by mouth daily.     No facility-administered medications prior to visit.      Allergies:   Ciprofloxacin; Flagyl [metronidazole]; and Morphine and related   Social History   Social History  . Marital status: Divorced    Spouse name: N/A  . Number of children: 4  . Years of education: N/A   Occupational History  . retired Agricultural engineer    Social History Main Topics  . Smoking status: Current Every Day Smoker    Packs/day: 0.50    Years: 40.00    Last attempt to quit: 01/12/2013  . Smokeless tobacco: Never Used     Comment: smokes 1/2 PPD, quite 01/2013, restarted in 2017; smokes more when her daughter who smokes visits  . Alcohol use No  . Drug use: No  . Sexual activity: Not Currently   Other Topics Concern  . None   Social History Narrative   Lives alone, no pets (cat died).  2 children Darel Hong and Jonny Ruiz) live at 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     Family History:  The patient's family history includes Alcohol abuse in her brother; Alzheimer's disease (age of onset: 93) in her brother; Cancer in her other and sister; Crohn's disease in her daughter; Diabetes in her brother and mother; Heart disease in her brother, father, and mother; Heart disease (age of onset: 50) in her other; Hyperlipidemia in her  father and mother; Hypertension in her father and mother; Stroke (age of onset: 59) in her other; Ulcerative colitis in her daughter.   ROS:   Please see the history of present illness.    ROS All other systems reviewed and are negative.   PHYSICAL EXAM:   VS:  BP 108/64   Pulse 64   Wt 142 lb (64.4 kg)   SpO2 96%   BMI 24.37 kg/m    GEN: Well nourished, well developed, in no acute distress  HEENT: normal  Neck: no JVD, carotid bruits, or masses Cardiac: RRR; no murmurs, rubs, or gallops,no edema  Respiratory:  clear to auscultation bilaterally, normal work of breathing GI: soft, nontender, nondistended, + BS  MS: no deformity or atrophy  Skin: warm and dry, no rash Neuro:  Alert and Oriented x 3, Strength and sensation are intact Psych: euthymic mood, full affect  Wt Readings from Last 3 Encounters:  07/25/16 142 lb (64.4 kg)  03/01/16 144 lb 9.6 oz (65.6 kg)  09/06/15 143 lb (64.9 kg)      Studies/Labs Reviewed:   EKG:  EKG is ordered today.  The ekg ordered today demonstrates Atrial paced rhythm, heart rate in the 60s, EKG machine mistakenly counted pacer spike. Unchanged when compared to the previous EKG.  Recent Labs: 03/01/2016: ALT 6; BUN 17; Creat 0.94; Hemoglobin 14.0; Platelets 202; Potassium 4.0; Sodium 141   Lipid Panel    Component Value Date/Time   CHOL 173 03/01/2016 1436   TRIG 234 (H) 03/01/2016 1436   HDL 44 (L) 03/01/2016 1436   CHOLHDL 3.9 03/01/2016 1436   VLDL 47 (H) 03/01/2016 1436   LDLCALC 82 03/01/2016 1436    Additional studies/ records that were reviewed today include:   Echo 2010   Myoview 11/2008   ASSESSMENT:    1. SOB (shortness of breath)   2. DOE (dyspnea on exertion)   3. Sinus node dysfunction (HCC)   4. Medication management   5. CKD (chronic kidney disease), stage III   6. Essential hypertension   7. Hyperlipidemia, unspecified hyperlipidemia type      PLAN:  In order of problems listed above:  1. Shortness  of breath: Mainly associated with dyspnea. He ambulated 2 laps in the clinic, her O2 saturations 96% briefly down to 87% for single second and bounced right back up to 89%. I do not think she need home oxygen at this time. On physical exam, she has diminished breath sounds in bilateral bases. I will obtain an echocardiogram to establish a baseline, however more likely explanation for her shortness of breath is chronic tobacco abuse. If echo is normal, I will refer her to pulmonology service. Note she recently had a chest x-ray obtained at [facility] urgent care that showed possible emphysema.   2. Sinus node dysfunction s/p PPM: Recent remote transmission has been reviewed by Dr. Royann Shiversroitoru, her device is functioning normally.  3. HTN: Blood pressure well-controlled at 108/64 in the office today.  4. HLD: Last lipid panel obtained on 03/01/2016 showed total cholesterol 174, HDL 44, LDL 82, triglyceride 234. She is only on fish oil, she is not on any statin.    Medication Adjustments/Labs and Tests Ordered: Current medicines are reviewed at length with the patient today.  Concerns regarding medicines are outlined above.  Medication changes, Labs and Tests ordered today are listed in the Patient Instructions below. Patient Instructions  Medication Instructions:  Your physician recommends that you continue on your current medications as directed. Please refer to the Current Medication list given to you today.  If you need a refill on your cardiac medications before your next appointment, please call your pharmacy.  Labwork: CBC TODAY AT [facility] ON THE 1ST FLOOR  Testing/Procedures: Your physician has requested that you have an echocardiogram. Echocardiography is a painless test that uses sound waves to create images of your heart. It provides your doctor with information about the size and shape of your heart and how well your heart's chambers and valves are working. This procedure takes  approximately one hour. There are no restrictions for this procedure.  Follow-Up: Your physician wants you to follow-up in: 2 MONTHS WITH DR CROITORU OR IF ABNORMAL ECHO  Thank you for choosing [facility]!!    Katherina Wimer, PA-C [place], LPN     Ramond Dial, [place]  07/25/2016 5:24 PM    [facility] [address] [place], [place], [place]  [address] Phone: [phone]; Fax: [phone]

## 2016-07-27 ENCOUNTER — Telehealth: Payer: Self-pay | Admitting: *Deleted

## 2016-07-27 NOTE — Telephone Encounter (Signed)
Left msg w results (OK per DPR) w instructions to call if questions.

## 2016-07-27 NOTE — Telephone Encounter (Signed)
-----   Message from FinleyHao Meng, [place] sent at 07/25/2016  8:49 PM EDT ----- Normal red blood cell count, no sign of anemia

## 2016-08-02 ENCOUNTER — Ambulatory Visit: Payer: Medicare HMO | Admitting: Cardiology

## 2016-08-09 ENCOUNTER — Other Ambulatory Visit: Payer: Self-pay

## 2016-08-09 ENCOUNTER — Ambulatory Visit (HOSPITAL_COMMUNITY): Payer: Medicare HMO | Attending: Cardiovascular Disease

## 2016-08-09 DIAGNOSIS — R0609 Other forms of dyspnea: Secondary | ICD-10-CM

## 2016-08-09 DIAGNOSIS — R0602 Shortness of breath: Secondary | ICD-10-CM

## 2016-08-09 DIAGNOSIS — R0600 Dyspnea, unspecified: Secondary | ICD-10-CM

## 2016-08-10 ENCOUNTER — Telehealth: Payer: Self-pay | Admitting: *Deleted

## 2016-08-10 DIAGNOSIS — R0609 Other forms of dyspnea: Secondary | ICD-10-CM

## 2016-08-10 DIAGNOSIS — R0600 Dyspnea, unspecified: Secondary | ICD-10-CM

## 2016-08-10 NOTE — Telephone Encounter (Signed)
Results and recommendations discussed with patient's daughter, who verbalized understanding and thanks.  Notes pt breathing better since smoking cessation and discontinuation of use of kerosene heater indoors. Still thinks pt would benefit from pulm referral, so agreeable for order placement.  She requested referral to go to Dr. Vassie Loll if possible. Informed her I would include request on order.

## 2016-08-10 NOTE — Telephone Encounter (Signed)
-----   Message from [place], [place] sent at 08/10/2016  8:37 AM EDT ----- Echo shows normal pumping function and normal valve. [name]'s shortness of breath is out of proportion to her cardiac finding. Likely explaination is pulmonary issue, I recommend referral to pulmonology service.

## 2016-09-19 ENCOUNTER — Ambulatory Visit (INDEPENDENT_AMBULATORY_CARE_PROVIDER_SITE_OTHER): Payer: Medicare HMO | Admitting: Cardiovascular Disease

## 2016-09-19 VITALS — BP 120/70 | HR 64 | Ht 64.0 in | Wt 144.6 lb

## 2016-09-19 DIAGNOSIS — I495 Sick sinus syndrome: Secondary | ICD-10-CM | POA: Diagnosis not present

## 2016-09-19 DIAGNOSIS — I10 Essential (primary) hypertension: Secondary | ICD-10-CM | POA: Diagnosis not present

## 2016-09-19 DIAGNOSIS — I471 Supraventricular tachycardia: Secondary | ICD-10-CM

## 2016-09-19 DIAGNOSIS — E782 Mixed hyperlipidemia: Secondary | ICD-10-CM

## 2016-09-19 DIAGNOSIS — Z950 Presence of cardiac pacemaker: Secondary | ICD-10-CM | POA: Diagnosis not present

## 2016-09-19 LAB — CUP PACEART INCLINIC DEVICE CHECK
Battery Impedance: 1966 Ohm
Battery Remaining Longevity: 25 mo
Battery Voltage: 2.73 V
Brady Statistic AP VP Percent: 5.2 %
Brady Statistic AP VS Percent: 89.2 %
Brady Statistic AS VP Percent: 0.5 %
Brady Statistic AS VS Percent: 5.2 %
Date Time Interrogation Session: 20180514105849
Implantable Lead Implant Date: 19940815
Implantable Lead Implant Date: 19940815
Implantable Lead Location: 753859
Implantable Lead Location: 753860
Implantable Pulse Generator Implant Date: 20071015
Lead Channel Impedance Value: 370 Ohm
Lead Channel Impedance Value: 402 Ohm
Lead Channel Pacing Threshold Amplitude: 0.5 V
Lead Channel Pacing Threshold Amplitude: 1.25 V
Lead Channel Pacing Threshold Pulse Width: 0.4 ms
Lead Channel Pacing Threshold Pulse Width: 0.4 ms
Lead Channel Sensing Intrinsic Amplitude: 2.8 mV
Lead Channel Setting Pacing Amplitude: 1.5 V
Lead Channel Setting Pacing Amplitude: 2.25 V
Lead Channel Setting Pacing Pulse Width: 0.4 ms
Lead Channel Setting Sensing Sensitivity: 2 mV

## 2016-09-19 NOTE — Progress Notes (Signed)
Patient ID: [name], female   DOB: 01-14-1928, 83 y.o.   MRN: [id number]    Cardiology Office Note    Date:  09/20/2016   ID:  Sherri, Atkinson 02-26-1928, MRN [id number]  PCP:  Sherri Arrow, MD  Cardiologist:   Sherri Fair, MD   Chief Complaint  Patient presents with  . Follow-up    pt has no complaints today, check up     History of Present Illness:  [name] is a 83 y.o. female with sinus node dysfunction which has progressed to atrial standstill. She has normal atrioventricular conduction. She is pacemaker dependent (atrial). She initially had a pacemaker implanted in 1994 and has subsequently had 2 generator change out procedures, most recently in 2007. She still has the original unipolar atrial and ventricular leads from 1994.   She had some problems with shortness of breath earlier this year and etiology was unclear. Her breathing became substantially better once she stopped using her kerosene heater. Her breathing has improved. She had no difficulty climbing the steps on the [place] a few days ago.  Interrogation of her dual-chamber Medtronic Adapta device shows estimated battery longevity of 25 months. She has 94% atrial pacing and roughly 6% ventricular pacing. No true episodes of atrial fibrillation or ventricular tachycardia are seen, but similar to the past there is "noise" on her atrial and ventricular  old unipolar leads implanted in 1994, especially on the atrial lead, possibly with occasional pacing inhibition. Lead noise remains minimal on the ventricular lead. R-wave sensing is mediocre, but stable.  Past Medical History:  Diagnosis Date  . Chest wall pain 2003   atypical, now resolved  . CKD (chronic kidney disease), stage III    Cr 1.27, GFR 40 (07/2011)  . Diverticulitis    with DT flare in 6/09  . Gout    R hand  . Hypertension   . Hypertriglyceridemia   . Macular degeneration    Dr. Sharlot Gowda  . Pacemaker    sees Dr. Rubie Maid [facility]) annually.  Pacemaker replaced 2007 [facility])  . Sick sinus syndrome (HCC) with bradycardia  . Tobacco use disorder    quit 01/2013--restarted in 2017  . Urge incontinence     Past Surgical History:  Procedure Laterality Date  . ABDOMINAL HYSTERECTOMY  1978   partial  . APPENDECTOMY  1952  . CARDIAC CATHETERIZATION  2000  . CHOLECYSTECTOMY  1998   Dr. Gerrit Friends  . COLON SURGERY  11/2009   vaginal-colon fistula repair (Drs. Delane Ginger)  . CYSTOSCOPY  2002   Dr. Wanda Plump  . EYE SURGERY  11/2004   cataract surgery b/l - Dr.Bergen  . PACEMAKER INSERTION  1994, 2000, 2007  . TUBAL LIGATION  1960    Current Medications: Outpatient Medications Prior to Visit  Medication Sig Dispense Refill  . aspirin 81 MG tablet Take 81 mg by mouth daily.    . calcium carbonate (OS-CAL) 600 MG TABS Take 630 mg by mouth daily.     . furosemide (LASIX) 40 MG tablet TAKE ONE (1) TABLET BY MOUTH EVERY DAY 90 tablet 0  . metoprolol tartrate (LOPRESSOR) 25 MG tablet Take 1 tablet (25 mg total) by mouth 2 (two) times daily. 60 tablet 11  . Multiple Vitamins-Minerals (MULTIVITAMIN WITH MINERALS) tablet Take 1 tablet by mouth daily.    . Multiple Vitamins-Minerals (OCUVITE PRESERVISION PO) Take 1 tablet by mouth daily.    . Omega-3 Fatty Acids (FISH OIL) 1000 MG CAPS Take 3  capsules by mouth daily.    . polycarbophil (FIBERCON) 625 MG tablet Take 1,875 mg by mouth daily.     No facility-administered medications prior to visit.      Allergies:   Ciprofloxacin; Flagyl [metronidazole]; and Morphine and related   Social History   Social History  . Marital status: Divorced    Spouse name: N/A  . Number of children: 4  . Years of education: N/A   Occupational History  . retired Agricultural engineernursing assistant    Social History Main Topics  . Smoking status: Current Every Day Smoker    Packs/day: 0.50    Years: 40.00    Last attempt to quit: 01/12/2013  . Smokeless tobacco: Never Used     Comment: smokes 1/2 PPD, quite  01/2013, restarted in 2017; smokes more when her daughter who smokes visits  . Alcohol use No  . Drug use: No  . Sexual activity: Not Currently   Other Topics Concern  . Not on file   Social History Narrative   Lives alone, no pets (cat died).  2 children Darel Hong(Judy and Jonny RuizJohn) [place] [place], Information systems managerDeborah (RN at neuro ICU) [place], [place] [name]([name]) lives at [place].   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     Family History:  The patient's family history includes Alcohol abuse in her brother; Alzheimer's disease (age of onset: 2370) in her brother; Cancer in her other and sister; Crohn's disease in her daughter; Diabetes in her brother and mother; Heart disease in her brother, father, and mother; Heart disease (age of onset: 7562) in her other; Hyperlipidemia in her father and mother; Hypertension in her father and mother; Stroke (age of onset: 8362) in her other; Ulcerative colitis in her daughter.   ROS:   Please see the history of present illness.    ROS All other systems reviewed and are negative.   PHYSICAL EXAM:   VS:  BP 120/70   Pulse 64   Ht 5\' 4"  (1.626 m)   Wt 65.6 kg (144 lb 9.6 oz)   BMI 24.82 kg/m     General: Alert, oriented x3, no distress Head: no evidence of trauma, PERRL, EOMI, no exophtalmos or lid lag, no myxedema, no xanthelasma; normal ears, nose and oropharynx Neck: normal jugular venous pulsations and no hepatojugular reflux; brisk carotid pulses without delay and no carotid bruits Chest: clear to auscultation, no signs of consolidation by percussion or palpation, normal fremitus, symmetrical and full respiratory excursions,  healthy appearing left subclavian pacemaker site Cardiovascular: normal position and quality of the apical impulse, regular rhythm, normal first and second heart sounds, no murmurs, rubs or gallops Abdomen: no tenderness or distention, no masses by palpation, no abnormal pulsatility or arterial bruits, normal  bowel sounds, no hepatosplenomegaly Extremities: no clubbing, cyanosis or edema; 2+ radial, ulnar and brachial pulses bilaterally; 2+ right femoral, posterior tibial and dorsalis pedis pulses; 2+ left femoral, posterior tibial and dorsalis pedis pulses; no subclavian or femoral bruits Neurological: grossly nonfocal   Wt Readings from Last 3 Encounters:  09/19/16 65.6 kg (144 lb 9.6 oz)  07/25/16 64.4 kg (142 lb)  03/01/16 65.6 kg (144 lb 9.6 oz)      Studies/Labs Reviewed:   EKG:  EKG is ordered today.  The ekg ordered today demonstrates atrial paced ventricular sensed, otherwise normal  Recent Labs: 03/01/2016: ALT 6; BUN 17; Creat 0.94; Potassium 4.0; Sodium 141 07/25/2016: Hemoglobin 13.9; Platelets 220  Lipid Panel    Component Value Date/Time   CHOL 173 03/01/2016 1436   TRIG 234 (H) 03/01/2016 1436   HDL 44 (L) 03/01/2016 1436   CHOLHDL 3.9 03/01/2016 1436   VLDL 47 (H) 03/01/2016 1436   LDLCALC 82 03/01/2016 1436    ASSESSMENT:    1. Sinus node dysfunction (HCC)   2. Pacemaker - dual chamber, Medtronic 2007, unipolar leads 1994   3. Essential hypertension, benign   4. PAT (paroxysmal atrial tachycardia) (HCC)   5. Mixed hyperlipidemia      PLAN:  In order of problems listed above:  1. SSS: Satisfactory control of bradycardia/chronotropic incompetence with current device settings 2. PM -  Despite some issues related to her old unipolar leads, device function is appropriate; continue remote downloads every 3 months. When it comes time for generator change out in a couple of years will have to weight the benefits of placing a new bipolar leads versus the risks of a more invasive procedure. 3. HTN - Excellent blood pressure control 4. PAT - tolerating low-dose beta blocker therapy. 5. HLP - satisfactory lipid profile for a patient without established coronary or vascular stenoses.    Medication Adjustments/Labs and Tests Ordered: Current medicines are reviewed  at length with the patient today.  Concerns regarding medicines are outlined above.  Medication changes, Labs and Tests ordered today are listed in the Patient Instructions below. Patient Instructions  Dr Royann Shivers recommends that you continue on your current medications as directed. Please refer to the Current Medication list given to you today.  Remote monitoring is used to monitor your Pacemaker or ICD from home. This monitoring reduces the number of office visits required to check your device to one time per year. It allows [place] to keep an eye on the functioning of your device to ensure it is working properly. You are scheduled for a device check from home on Wednesday, August 8th, 2018. You may send your transmission at any time that day. If you have a wireless device, the transmission will be sent automatically. After your physician reviews your transmission, you will receive a notification with your next transmission date.  Dr Royann Shivers recommends that you schedule a follow-up appointment in 12 months with a pacemaker check. You will receive a reminder letter in the mail two months in advance. If you don't receive a letter, please call our office to schedule the follow-up appointment.  If you need a refill on your cardiac medications before your next appointment, please call your pharmacy.    Signed, Sherri Fair, MD  09/20/2016 1:30 PM    [facility] [address] [place], [place], [place]  [address] Phone: [phone]; Fax: [phone]

## 2016-09-19 NOTE — Patient Instructions (Signed)
Dr Croitoru recommends that you continue on your current medications as directed. Please refer to the Current Medication list given to you today.  Remote monitoring is used to monitor your Pacemaker or ICD from home. This monitoring reduces the number of office visits required to check your device to one time per year. It allows us to keep an eye on the functioning of your device to ensure it is working properly. You are scheduled for a device check from home on Wednesday, August 8th, 2018. You may send your transmission at any time that day. If you have a wireless device, the transmission will be sent automatically. After your physician reviews your transmission, you will receive a notification with your next transmission date.  Dr Croitoru recommends that you schedule a follow-up appointment in 12 months with a pacemaker check. You will receive a reminder letter in the mail two months in advance. If you don't receive a letter, please call our office to schedule the follow-up appointment.  If you need a refill on your cardiac medications before your next appointment, please call your pharmacy. 

## 2016-09-20 ENCOUNTER — Encounter: Payer: Self-pay | Admitting: Cardiovascular Disease

## 2016-09-21 ENCOUNTER — Other Ambulatory Visit: Payer: Self-pay | Admitting: Cardiovascular Disease

## 2016-09-24 DIAGNOSIS — H35423 Microcystoid degeneration of retina, bilateral: Secondary | ICD-10-CM | POA: Diagnosis not present

## 2016-09-24 DIAGNOSIS — H43813 Vitreous degeneration, bilateral: Secondary | ICD-10-CM | POA: Diagnosis not present

## 2016-09-24 DIAGNOSIS — H43823 Vitreomacular adhesion, bilateral: Secondary | ICD-10-CM | POA: Diagnosis not present

## 2016-09-24 DIAGNOSIS — H353211 Exudative age-related macular degeneration, right eye, with active choroidal neovascularization: Secondary | ICD-10-CM | POA: Diagnosis not present

## 2016-10-05 ENCOUNTER — Other Ambulatory Visit: Payer: Self-pay | Admitting: Family Medicine

## 2016-10-05 ENCOUNTER — Other Ambulatory Visit: Payer: Self-pay | Admitting: Cardiovascular Disease

## 2016-10-05 DIAGNOSIS — I10 Essential (primary) hypertension: Secondary | ICD-10-CM

## 2016-10-05 NOTE — Telephone Encounter (Signed)
REFILL 

## 2016-10-11 ENCOUNTER — Ambulatory Visit: Payer: Medicare HMO | Admitting: Cardiovascular Disease

## 2016-10-16 ENCOUNTER — Institutional Professional Consult (permissible substitution): Payer: Medicare HMO | Admitting: Pulmonary Disease

## 2016-12-19 ENCOUNTER — Ambulatory Visit (INDEPENDENT_AMBULATORY_CARE_PROVIDER_SITE_OTHER): Payer: Medicare HMO | Admitting: *Deleted

## 2016-12-19 DIAGNOSIS — I495 Sick sinus syndrome: Secondary | ICD-10-CM

## 2016-12-20 ENCOUNTER — Encounter: Payer: Self-pay | Admitting: Cardiology

## 2016-12-20 DIAGNOSIS — H43813 Vitreous degeneration, bilateral: Secondary | ICD-10-CM | POA: Diagnosis not present

## 2016-12-20 DIAGNOSIS — H353231 Exudative age-related macular degeneration, bilateral, with active choroidal neovascularization: Secondary | ICD-10-CM | POA: Diagnosis not present

## 2016-12-20 DIAGNOSIS — H43823 Vitreomacular adhesion, bilateral: Secondary | ICD-10-CM | POA: Diagnosis not present

## 2016-12-20 DIAGNOSIS — H35423 Microcystoid degeneration of retina, bilateral: Secondary | ICD-10-CM | POA: Diagnosis not present

## 2016-12-20 NOTE — Progress Notes (Signed)
Remote pacemaker transmission.   

## 2017-01-04 LAB — CUP PACEART REMOTE DEVICE CHECK
Battery Remaining Longevity: 23 mo
Brady Statistic AP VP Percent: 25 %
Brady Statistic AS VS Percent: 3 %
Implantable Lead Implant Date: 19940815
Lead Channel Impedance Value: 373 Ohm
Lead Channel Impedance Value: 413 Ohm
Lead Channel Pacing Threshold Amplitude: 0.875 V
Lead Channel Setting Pacing Amplitude: 1.5 V
Lead Channel Setting Pacing Amplitude: 2 V
Lead Channel Setting Sensing Sensitivity: 2 mV
MDC IDC LEAD IMPLANT DT: 19940815
MDC IDC LEAD LOCATION: 753859
MDC IDC LEAD LOCATION: 753860
MDC IDC MSMT BATTERY IMPEDANCE: 2245 Ohm
MDC IDC MSMT BATTERY VOLTAGE: 2.73 V
MDC IDC MSMT LEADCHNL RA PACING THRESHOLD AMPLITUDE: 0.75 V
MDC IDC MSMT LEADCHNL RA PACING THRESHOLD PULSEWIDTH: 0.4 ms
MDC IDC MSMT LEADCHNL RV PACING THRESHOLD PULSEWIDTH: 0.4 ms
MDC IDC PG IMPLANT DT: 20071015
MDC IDC SESS DTM: 20180808121358
MDC IDC SET LEADCHNL RV PACING PULSEWIDTH: 0.4 ms
MDC IDC STAT BRADY AP VS PERCENT: 71 %
MDC IDC STAT BRADY AS VP PERCENT: 1 %

## 2017-01-07 ENCOUNTER — Encounter: Payer: Self-pay | Admitting: Cardiology

## 2017-02-13 ENCOUNTER — Other Ambulatory Visit: Payer: Self-pay

## 2017-02-13 DIAGNOSIS — I10 Essential (primary) hypertension: Secondary | ICD-10-CM

## 2017-02-13 MED ORDER — METOPROLOL TARTRATE 25 MG PO TABS: 25.0000 mg | ORAL_TABLET | Freq: Two times a day (BID) | ORAL | 1 refills | Status: DC

## 2017-02-19 ENCOUNTER — Encounter: Payer: Self-pay | Admitting: Medical

## 2017-02-19 ENCOUNTER — Ambulatory Visit (INDEPENDENT_AMBULATORY_CARE_PROVIDER_SITE_OTHER): Payer: Medicare HMO | Admitting: Medical

## 2017-02-19 VITALS — BP 120/70 | HR 59 | Temp 97.6°F | Wt 141.0 lb

## 2017-02-19 DIAGNOSIS — R109 Unspecified abdominal pain: Secondary | ICD-10-CM

## 2017-02-19 DIAGNOSIS — R350 Frequency of micturition: Secondary | ICD-10-CM | POA: Diagnosis not present

## 2017-02-19 DIAGNOSIS — K5900 Constipation, unspecified: Secondary | ICD-10-CM

## 2017-02-19 DIAGNOSIS — M545 Low back pain, unspecified: Secondary | ICD-10-CM

## 2017-02-19 LAB — POCT URINALYSIS DIP (PROADVANTAGE DEVICE)
Bilirubin, UA: NEGATIVE
Blood, UA: NEGATIVE
GLUCOSE UA: NEGATIVE mg/dL
Ketones, POC UA: NEGATIVE mg/dL
Leukocytes, UA: NEGATIVE
NITRITE UA: NEGATIVE
PH UA: 7 (ref 5.0–8.0)
PROTEIN UA: NEGATIVE mg/dL
Specific Gravity, Urine: 1.015
UUROB: NEGATIVE

## 2017-02-19 LAB — COMPREHENSIVE METABOLIC PANEL
AG Ratio: 1.4 (calc) (ref 1.0–2.5)
ALT: 5 U/L — AB (ref 6–29)
AST: 24 U/L (ref 10–35)
Albumin: 3.8 g/dL (ref 3.6–5.1)
Alkaline phosphatase (APISO): 55 U/L (ref 33–130)
BILIRUBIN TOTAL: 0.5 mg/dL (ref 0.2–1.2)
BUN/Creatinine Ratio: 15 (calc) (ref 6–22)
BUN: 19 mg/dL (ref 7–25)
CALCIUM: 10.2 mg/dL (ref 8.6–10.4)
CO2: 31 mmol/L (ref 20–32)
Chloride: 99 mmol/L (ref 98–110)
Creat: 1.26 mg/dL — ABNORMAL HIGH (ref 0.60–0.88)
GLUCOSE: 108 mg/dL — AB (ref 65–99)
Globulin: 2.7 g/dL (calc) (ref 1.9–3.7)
Potassium: 4.3 mmol/L (ref 3.5–5.3)
SODIUM: 139 mmol/L (ref 135–146)
TOTAL PROTEIN: 6.5 g/dL (ref 6.1–8.1)

## 2017-02-19 LAB — CBC WITH DIFFERENTIAL/PLATELET
BASOS ABS: 58 {cells}/uL (ref 0–200)
Basophils Relative: 0.6 %
EOS PCT: 2.4 %
Eosinophils Absolute: 230 cells/uL (ref 15–500)
HEMATOCRIT: 40.8 % (ref 35.0–45.0)
Hemoglobin: 13.9 g/dL (ref 11.7–15.5)
Lymphs Abs: 2957 cells/uL (ref 850–3900)
MCH: 31.7 pg (ref 27.0–33.0)
MCHC: 34.1 g/dL (ref 32.0–36.0)
MCV: 92.9 fL (ref 80.0–100.0)
MONOS PCT: 6.3 %
MPV: 12.6 fL — ABNORMAL HIGH (ref 7.5–12.5)
NEUTROS PCT: 59.9 %
Neutro Abs: 5750 cells/uL (ref 1500–7800)
PLATELETS: 205 10*3/uL (ref 140–400)
RBC: 4.39 10*6/uL (ref 3.80–5.10)
RDW: 12.1 % (ref 11.0–15.0)
Total Lymphocyte: 30.8 %
WBC mixed population: 605 cells/uL (ref 200–950)
WBC: 9.6 10*3/uL (ref 3.8–10.8)

## 2017-02-19 MED ORDER — SULFAMETHOXAZOLE-TRIMETHOPRIM 800-160 MG PO TABS: 1.0000 | ORAL_TABLET | Freq: Two times a day (BID) | ORAL | 0 refills | Status: DC

## 2017-02-19 MED ORDER — TRAMADOL HCL 50 MG PO TABS: 50.0000 mg | ORAL_TABLET | Freq: Three times a day (TID) | ORAL | 0 refills | Status: DC | PRN
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
## 2017-02-20 ENCOUNTER — Telehealth: Payer: Self-pay | Admitting: Family Medicine

## 2017-02-20 NOTE — Telephone Encounter (Signed)
Left message for pt to call needs appt per [facility]. Please note staff message Dr. Lynelle Doctor sent to Georgiann Hahn    It looks like Faythe saw Vincenza Hews for UTI today.  Reviewing her chart, she hasn't been seen by me since 02/2016, which was her last AWV. She does not have another one scheduled (unless I somehow missed that). I know that she will be requesting med refill by the end of November. She needs to be seen for AWV between now and then please. This likely wasn't mentioned at all at her visit today, since it was just an acute visit with [facility]

## 2017-02-21 ENCOUNTER — Telehealth: Payer: Self-pay

## 2017-02-21 NOTE — Telephone Encounter (Signed)
Pt 's  Daughter called and said that [place] is still complaining of burning/pain with urinating. Pt was give an bactrim by Vincenza Hews when she was seen. Wants to know if there is some else can be done or given ?

## 2017-02-21 NOTE — Telephone Encounter (Signed)
The urine culture shows infection with E.coli--it is only a preliminary result, and the sensitivities aren't back yet to know if the antibiotic needs to be changed.  Once those results are back, we can determine if her antibiotic needs to be changed, and to which one.  She can you AZO today, if needed, while waiting for results.    Someone will need to look at her results tomorrow (they are going to [employer], since he saw her and ordered lab)--if someone can please follow up on this tomorrow

## 2017-02-21 NOTE — Telephone Encounter (Signed)
Spoke with patient's daughter [name] and she will go and get AZO for the flank pain. I assured her that Martie Lee will check on the culture results tomorrow and we will be back in touch with her if/when abx needs changed once we get sensitivity result.

## 2017-02-22 LAB — URINE CULTURE
MICRO NUMBER:: 81123465
SPECIMEN QUALITY: ADEQUATE

## 2017-02-22 NOTE — Telephone Encounter (Signed)
Spoke to Dr. Lynelle Doctor to let her know that the antibiotic she is on is correct and to see if she is feeling better. Tried to call debbie but left a message and house number not working.
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
## 2017-02-25 ENCOUNTER — Ambulatory Visit (INDEPENDENT_AMBULATORY_CARE_PROVIDER_SITE_OTHER): Payer: Medicare HMO | Admitting: Family Medicine

## 2017-02-25 ENCOUNTER — Encounter: Payer: Self-pay | Admitting: Family Medicine

## 2017-02-25 VITALS — BP 110/60 | HR 60 | Temp 99.4°F | Ht 64.0 in | Wt 139.0 lb

## 2017-02-25 DIAGNOSIS — R531 Weakness: Secondary | ICD-10-CM

## 2017-02-25 DIAGNOSIS — W19XXXA Unspecified fall, initial encounter: Secondary | ICD-10-CM | POA: Diagnosis not present

## 2017-02-25 DIAGNOSIS — M62838 Other muscle spasm: Secondary | ICD-10-CM | POA: Diagnosis not present

## 2017-02-25 DIAGNOSIS — M545 Low back pain, unspecified: Secondary | ICD-10-CM

## 2017-02-25 DIAGNOSIS — A498 Other bacterial infections of unspecified site: Secondary | ICD-10-CM | POA: Diagnosis not present

## 2017-02-25 LAB — POCT URINALYSIS DIP (PROADVANTAGE DEVICE)
Bilirubin, UA: NEGATIVE
GLUCOSE UA: NEGATIVE mg/dL
Ketones, POC UA: NEGATIVE mg/dL
NITRITE UA: POSITIVE — AB
RBC UA: NEGATIVE
Specific Gravity, Urine: 1.02
pH, UA: 6 (ref 5.0–8.0)

## 2017-02-25 LAB — CBC WITH DIFFERENTIAL/PLATELET
BASOS PCT: 0.5 %
Basophils Absolute: 49 cells/uL (ref 0–200)
EOS PCT: 0.7 %
Eosinophils Absolute: 69 cells/uL (ref 15–500)
HEMATOCRIT: 38.5 % (ref 35.0–45.0)
HEMOGLOBIN: 13.2 g/dL (ref 11.7–15.5)
LYMPHS ABS: 2479 {cells}/uL (ref 850–3900)
MCH: 31.3 pg (ref 27.0–33.0)
MCHC: 34.3 g/dL (ref 32.0–36.0)
MCV: 91.2 fL (ref 80.0–100.0)
MPV: 12.3 fL (ref 7.5–12.5)
Monocytes Relative: 7.5 %
NEUTROS ABS: 6468 {cells}/uL (ref 1500–7800)
Neutrophils Relative %: 66 %
Platelets: 225 10*3/uL (ref 140–400)
RBC: 4.22 10*6/uL (ref 3.80–5.10)
RDW: 12.2 % (ref 11.0–15.0)
Total Lymphocyte: 25.3 %
WBC: 9.8 10*3/uL (ref 3.8–10.8)
WBCMIX: 735 {cells}/uL (ref 200–950)

## 2017-02-25 LAB — COMPREHENSIVE METABOLIC PANEL
AG Ratio: 1.5 (calc) (ref 1.0–2.5)
ALBUMIN MSPROF: 3.8 g/dL (ref 3.6–5.1)
ALKALINE PHOSPHATASE (APISO): 67 U/L (ref 33–130)
ALT: 6 U/L (ref 6–29)
AST: 28 U/L (ref 10–35)
BILIRUBIN TOTAL: 0.5 mg/dL (ref 0.2–1.2)
BUN/Creatinine Ratio: 13 (calc) (ref 6–22)
BUN: 29 mg/dL — AB (ref 7–25)
CALCIUM: 9.4 mg/dL (ref 8.6–10.4)
CO2: 27 mmol/L (ref 20–32)
CREATININE: 2.18 mg/dL — AB (ref 0.60–0.88)
Chloride: 101 mmol/L (ref 98–110)
Globulin: 2.5 g/dL (calc) (ref 1.9–3.7)
Glucose, Bld: 122 mg/dL — ABNORMAL HIGH (ref 65–99)
POTASSIUM: 4.5 mmol/L (ref 3.5–5.3)
Sodium: 136 mmol/L (ref 135–146)
TOTAL PROTEIN: 6.3 g/dL (ref 6.1–8.1)

## 2017-02-25 LAB — CK: Total CK: 111 U/L (ref 29–143)

## 2017-02-25 MED ORDER — METHOCARBAMOL 500 MG PO TABS: 500.0000 mg | ORAL_TABLET | Freq: Four times a day (QID) | ORAL | 0 refills | Status: DC | PRN
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
## 2017-02-25 NOTE — Patient Instructions (Signed)
  Finish the antibiotics today as planned. We are checking blood work to make sure there hasn't been any damage to your kidneys or other problems (related to your recent fall).  I believe that your back pain is muscular. Continue to use [employer] or Biofreeze. Moist heat or heating pad will also help--consider [employer]are since you don't have power. After you have used heat, try and do some gentle stretches.  You can also gently massage the area, if not too painful. If you are having ongoing pain, we can refer your for physical therapy. Continue to use tylenol as needed for pain. Avoid heavy lifting, twisting, bending.  If these methods aren't effective in decreasing your pain, you can try a muscle relaxant.  I'm sending a prescription to your pharmacy, so it will be there if needed.

## 2017-03-01 ENCOUNTER — Encounter: Payer: Self-pay | Admitting: Family Medicine

## 2017-03-02 ENCOUNTER — Encounter: Payer: Self-pay | Admitting: Family Medicine
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
## 2017-03-04 ENCOUNTER — Other Ambulatory Visit: Payer: Self-pay | Admitting: *Deleted

## 2017-03-04 ENCOUNTER — Ambulatory Visit (INDEPENDENT_AMBULATORY_CARE_PROVIDER_SITE_OTHER): Payer: Medicare HMO | Admitting: Family Medicine

## 2017-03-04 ENCOUNTER — Ambulatory Visit
Admission: RE | Admit: 2017-03-04 | Discharge: 2017-03-04 | Disposition: A | Discharge status: Home / Self Care | Payer: Medicare HMO | Source: Ambulatory Visit | Attending: Family Medicine | Admitting: Family Medicine

## 2017-03-04 VITALS — BP 138/74 | HR 64 | Temp 97.3°F | Ht 64.0 in | Wt 141.4 lb

## 2017-03-04 DIAGNOSIS — M545 Low back pain, unspecified: Secondary | ICD-10-CM

## 2017-03-04 DIAGNOSIS — M4850XA Collapsed vertebra, not elsewhere classified, site unspecified, initial encounter for fracture: Secondary | ICD-10-CM | POA: Diagnosis not present

## 2017-03-04 DIAGNOSIS — N289 Disorder of kidney and ureter, unspecified: Secondary | ICD-10-CM

## 2017-03-04 DIAGNOSIS — S32031A Stable burst fracture of third lumbar vertebra, initial encounter for closed fracture: Secondary | ICD-10-CM | POA: Diagnosis not present

## 2017-03-04 DIAGNOSIS — M8000XA Age-related osteoporosis with current pathological fracture, unspecified site, initial encounter for fracture: Secondary | ICD-10-CM | POA: Diagnosis not present

## 2017-03-04 LAB — BASIC METABOLIC PANEL
BUN/Creatinine Ratio: 20 (calc) (ref 6–22)
BUN: 20 mg/dL (ref 7–25)
CALCIUM: 9.3 mg/dL (ref 8.6–10.4)
CHLORIDE: 106 mmol/L (ref 98–110)
CO2: 24 mmol/L (ref 20–32)
Creat: 1.01 mg/dL — ABNORMAL HIGH (ref 0.60–0.88)
Glucose, Bld: 96 mg/dL (ref 65–99)
POTASSIUM: 4.6 mmol/L (ref 3.5–5.3)
Sodium: 137 mmol/L (ref 135–146)

## 2017-03-04 LAB — POCT URINALYSIS DIP (PROADVANTAGE DEVICE)
BILIRUBIN UA: NEGATIVE
BILIRUBIN UA: NEGATIVE mg/dL
Glucose, UA: 100 mg/dL — AB
Leukocytes, UA: NEGATIVE
NITRITE UA: NEGATIVE
PH UA: 5.5 (ref 5.0–8.0)
PROTEIN UA: NEGATIVE mg/dL
RBC UA: NEGATIVE
Specific Gravity, Urine: 1.02
Urobilinogen, Ur: NEGATIVE

## 2017-03-04 MED ORDER — LIDOCAINE 5 % EX PTCH: 1.0000 | MEDICATED_PATCH | CUTANEOUS | 0 refills | Status: DC

## 2017-03-04 MED ORDER — CALCITONIN (SALMON) 200 UNIT/ACT NA SOLN: 1.0000 | Freq: Every day | NASAL | 12 refills | Status: DC

## 2017-03-04 NOTE — Patient Instructions (Addendum)
Consider trying Salon Pas patch that contains lidocaine. I sent a prescription for Lidoderm patch, but that may be expensive or not covered.  You can try this in the meantime (if we are waiting to get your insurance to cover, or try it if we cannot use the prescription).  We will try the Miacalcin nasal spray to see if it helps with the pain from the compression fracture. I inadvertantly sent a year's supply to the pharmacy.  I do not think you will need this more than a few months (as I am not planning to use this to treat osteoporosis, just for pain relief from the fracture). I do think there is also a component of muscular pain still going on.  Continue to use the methocarbamol and heat.  I am also referring for physical therapy.  If your kidney test comes back okay, we will let you know if you should try an anti-inflammatory medication.  If that doesn't seem to be helping, then definitely get the Physical Therapy.  Down the line, if you are not getting adequate pain relief, we can try getting you in with a pain doctor.  [facility] (on [address]) to schedule a bone density. Having had a compression fracture, we know that you have osteoporosis.  I'd like to get a baseline study and see if there is osteoporosis elsewhere, and then begin treatment, in order to prevent further fractures.     Vertebral Fracture A vertebral fracture is a break in one of the bones that make up the spine (vertebrae). The vertebrae are stacked on top of each other to form the spinal column. They support the body and protect the spinal cord. The vertebral column has an upper part (cervical spine), a middle part (thoracic spine), and a lower part (lumbar spine). Most vertebral fractures occur in the thoracic spine or lumbar spine. There are three main types of vertebral fractures:  Flexion fracture. This happens when vertebrae collapse. Vertebrae can collapse: ? In the front (compression  fracture). This type of fracture is common in people who have a condition that causes their bones to be weak and brittle (osteoporosis). The fracture can make a person lose height. ? In the front and back (axial burst fracture).  Extension fracture. This happens when an external force pulls apart the vertebrae.  Rotation fracture. This happens when the spine bends extremely in one direction. This type can cause a piece of a vertebra to break off (transverse process fracture) or move out of its normal position (fracture dislocation). This type of fracture has a high risk for spinal cord injury.  Vertebral fractures can range from mild to very severe. The most severe types are those that cause the broken bones to move out of place (unstable) and those that injure or press on the spinal cord. What are the causes? This condition is usually caused by a forceful injury. This type of injury commonly results from:  Car accidents.  Falling or jumping from a great height.  Collisions in contact sports.  Violent acts, such as an assault or a gunshot wound.  What increases the risk? This injury is more likely to happen to people who:  Have osteoporosis.  Participate in contact sports.  Are in situations that could result in falls or other violent injuries.  What are the signs or symptoms? Symptoms of this injury depend on the location and the type of fracture. The most common symptom is back pain that gets  worse with movement. You may also have trouble standing or walking. If a fracture has damaged your spinal cord or is pressing on it, you may also have:  Numbness.  Tingling.  Weakness.  Loss of movement.  Loss of bowel or bladder control.  How is this diagnosed? This injury may be diagnosed based on symptoms, medical history, and a physical exam. You may also have imaging tests to confirm the diagnosis. These may include:  Spine X-ray.  CT scan.  MRI.  How is this  treated? Treatment for this injury depends on the type of fracture. If your fracture is stable and does not affect your spinal cord, it may heal with nonsurgical treatment, such as:  Taking pain medicine.  Wearing a cast or a brace.  Doing physical therapy exercises.  If your vertebral fracture is unstable or it affects your spinal cord, you may need surgical treatment, such as:  Laminectomy. This procedure involves removing the part of a vertebra that is pushing on the spinal cord (spinal decompression surgery). Bone fragments may also be removed.  Spinal fusion. This procedure is used to stabilize an unstable fracture. Vertebrae may be joined together with a piece of bone from another part of your body (graft) and held in place with rods, plates, or screws.  Vertebroplasty. In this procedure, bone cement is used to rebuild collapsed vertebrae.  Follow these instructions at home: General instructions  Take medicines only as directed by your health care provider.  Do not drive or operate heavy machinery while taking pain medicine.  If directed, apply ice to the injured area: ? Put ice in a plastic bag. ? Place a towel between your skin and the bag. ? Leave the ice on for 30 minutes every two hours at first. Then apply the ice as needed.  Wear your neck brace or back brace as directed by your health care provider.  Do not drink alcohol. Alcohol can interfere with your treatment.  Keep all follow-up visits as directed by your health care provider. This is important. It can help to prevent permanent injury, disability, and long-lasting (chronic) pain. Activity  Stay in bed (on bed rest) only as directed by your health care provider. Being on bed rest for too long can make your condition worse.  Return to your normal activities as directed by your health care provider. Ask what activities are safe for you.  Do exercises to improve motion and strength in your back (physical  therapy), as recommended by your health care provider.  Exercise regularly as directed by your health care provider. Contact a health care provider if:  You have a fever.  You develop a cough that makes your pain worse.  Your pain medicine is not helping.  Your pain does not get better over time.  You cannot return to your normal activities as planned or expected. Get help right away if:  Your pain is very bad and it suddenly gets worse.  You are unable to move any body part (paralysis) that is below the level of your injury.  You have numbness, tingling, or weakness in any body part that is below the level of your injury.  You cannot control your bladder or bowels. This information is not intended to replace advice given to you by your health care provider. Make sure you discuss any questions you have with your health care provider. Document Released: 06/07/2004 Document Revised: 10/12/2015 Document Reviewed: 05/05/2014 Elsevier Interactive Patient Education  [employer].  Osteoporosis Osteoporosis is the thinning and loss of density in the bones. Osteoporosis makes the bones more brittle, fragile, and likely to break (fracture). Over time, osteoporosis can cause the bones to become so weak that they fracture after a simple fall. The bones most likely to fracture are the bones in the hip, wrist, and spine. What are the causes? The exact cause is not known. What increases the risk? Anyone can develop osteoporosis. You may be at greater risk if you have a family history of the condition or have poor nutrition. You may also have a higher risk if you are:  Female.  81 years old or older.  A smoker.  Not physically active.  White or Asian.  Slender.  What are the signs or symptoms? A fracture might be the first sign of the disease, especially if it results from a fall or injury that would not usually cause a bone to break. Other signs and symptoms include:  Low  back and neck pain.  Stooped posture.  Height loss.  How is this diagnosed? To make a diagnosis, your health care provider may:  Take a medical history.  Perform a physical exam.  Order tests, such as: ? A bone mineral density test. ? A dual-energy X-ray absorptiometry test.  How is this treated? The goal of osteoporosis treatment is to strengthen your bones to reduce your risk of a fracture. Treatment may involve:  Making lifestyle changes, such as: ? Eating a diet rich in calcium. ? Doing weight-bearing and muscle-strengthening exercises. ? Stopping tobacco use. ? Limiting alcohol intake.  Taking medicine to slow the process of bone loss or to increase bone density.  Monitoring your levels of calcium and vitamin D.  Follow these instructions at home:  Include calcium and vitamin D in your diet. Calcium is important for bone health, and vitamin D helps the body absorb calcium.  Perform weight-bearing and muscle-strengthening exercises as directed by your health care provider.  Do not use any tobacco products, including cigarettes, chewing tobacco, and electronic cigarettes. If you need help quitting, ask your health care provider.  Limit your alcohol intake.  Take medicines only as directed by your health care provider.  Keep all follow-up visits as directed by your health care provider. This is important.  Take precautions at home to lower your risk of falling, such as: ? Keeping rooms well lit and clutter free. ? Installing safety rails on stairs. ? Using rubber mats in the bathroom and other areas that are often wet or slippery. Get help right away if: You fall or injure yourself. This information is not intended to replace advice given to you by your health care provider. Make sure you discuss any questions you have with your health care provider. Document Released: 02/07/2005 Document Revised: 10/03/2015 Document Reviewed: 10/08/2013 Elsevier Interactive  Patient Education  2017 [employer]

## 2017-03-05 ENCOUNTER — Encounter: Payer: Self-pay | Admitting: Family Medicine

## 2017-03-11 DIAGNOSIS — H353231 Exudative age-related macular degeneration, bilateral, with active choroidal neovascularization: Secondary | ICD-10-CM | POA: Diagnosis not present

## 2017-03-11 DIAGNOSIS — H43813 Vitreous degeneration, bilateral: Secondary | ICD-10-CM | POA: Diagnosis not present

## 2017-03-11 DIAGNOSIS — H35423 Microcystoid degeneration of retina, bilateral: Secondary | ICD-10-CM | POA: Diagnosis not present

## 2017-03-11 DIAGNOSIS — H43823 Vitreomacular adhesion, bilateral: Secondary | ICD-10-CM | POA: Diagnosis not present

## 2017-03-17 ENCOUNTER — Telehealth: Payer: Self-pay | Admitting: Family Medicine

## 2017-03-17 NOTE — Telephone Encounter (Signed)
P.A. LIDOCAINE PATCH °

## 2017-03-20 ENCOUNTER — Telehealth: Payer: Self-pay | Admitting: Cardiology

## 2017-03-20 ENCOUNTER — Ambulatory Visit (INDEPENDENT_AMBULATORY_CARE_PROVIDER_SITE_OTHER): Payer: Medicare HMO | Admitting: *Deleted

## 2017-03-20 DIAGNOSIS — I495 Sick sinus syndrome: Secondary | ICD-10-CM

## 2017-03-20 NOTE — Telephone Encounter (Signed)
LMOVM reminding pt to send remote transmission.   

## 2017-03-21 ENCOUNTER — Encounter: Payer: Self-pay | Admitting: Cardiology

## 2017-03-21 NOTE — Progress Notes (Signed)
Remote pacemaker transmission.   

## 2017-03-23 NOTE — Telephone Encounter (Signed)
P.A. Denied, Medicare D not approved for treating low back pain, do you want to switch?

## 2017-03-25 NOTE — Telephone Encounter (Signed)
If you look at my note, you can see that we had a discussion about it not being covered.  Please let her know that it isn't covered. There were no alternatives to "switch" her to suggested.  See how she is doing--I referred her for PT, but I don't see anything set up yet.

## 2017-03-25 NOTE — Telephone Encounter (Signed)
Called pt and she just bought over the counter patches and is doing better.  States she is not going to do PT because she is better.  She has appt on Wednesday.

## 2017-03-26 LAB — CUP PACEART REMOTE DEVICE CHECK
Battery Remaining Longevity: 18 mo
Battery Voltage: 2.71 V
Brady Statistic AP VP Percent: 25 %
Date Time Interrogation Session: 20181107183143
Implantable Lead Implant Date: 19940815
Implantable Pulse Generator Implant Date: 20071015
Lead Channel Impedance Value: 344 Ohm
Lead Channel Pacing Threshold Amplitude: 0.75 V
Lead Channel Setting Pacing Amplitude: 1.5 V
Lead Channel Setting Pacing Pulse Width: 0.4 ms
MDC IDC LEAD IMPLANT DT: 19940815
MDC IDC LEAD LOCATION: 753859
MDC IDC LEAD LOCATION: 753860
MDC IDC MSMT BATTERY IMPEDANCE: 2794 Ohm
MDC IDC MSMT LEADCHNL RA PACING THRESHOLD AMPLITUDE: 0.5 V
MDC IDC MSMT LEADCHNL RA PACING THRESHOLD PULSEWIDTH: 0.4 ms
MDC IDC MSMT LEADCHNL RV IMPEDANCE VALUE: 339 Ohm
MDC IDC MSMT LEADCHNL RV PACING THRESHOLD PULSEWIDTH: 0.4 ms
MDC IDC SET LEADCHNL RV PACING AMPLITUDE: 2 V
MDC IDC SET LEADCHNL RV SENSING SENSITIVITY: 2 mV
MDC IDC STAT BRADY AP VS PERCENT: 71 %
MDC IDC STAT BRADY AS VP PERCENT: 1 %
MDC IDC STAT BRADY AS VS PERCENT: 3 %
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
## 2017-03-27 ENCOUNTER — Encounter: Payer: Self-pay | Admitting: Family Medicine

## 2017-03-27 ENCOUNTER — Ambulatory Visit (INDEPENDENT_AMBULATORY_CARE_PROVIDER_SITE_OTHER): Payer: Medicare HMO | Admitting: Family Medicine

## 2017-03-27 VITALS — BP 110/60 | HR 68 | Ht 64.0 in | Wt 142.0 lb

## 2017-03-27 DIAGNOSIS — I471 Supraventricular tachycardia: Secondary | ICD-10-CM | POA: Diagnosis not present

## 2017-03-27 DIAGNOSIS — E782 Mixed hyperlipidemia: Secondary | ICD-10-CM | POA: Diagnosis not present

## 2017-03-27 DIAGNOSIS — I10 Essential (primary) hypertension: Secondary | ICD-10-CM

## 2017-03-27 DIAGNOSIS — M4850XA Collapsed vertebra, not elsewhere classified, site unspecified, initial encounter for fracture: Secondary | ICD-10-CM | POA: Diagnosis not present

## 2017-03-27 DIAGNOSIS — R159 Full incontinence of feces: Secondary | ICD-10-CM | POA: Diagnosis not present

## 2017-03-27 DIAGNOSIS — Z720 Tobacco use: Secondary | ICD-10-CM

## 2017-03-27 DIAGNOSIS — Z0000 Encounter for general adult medical examination without abnormal findings: Secondary | ICD-10-CM

## 2017-03-27 NOTE — Patient Instructions (Addendum)
HEALTH MAINTENANCE RECOMMENDATIONS:  It is recommended that you get at least 30 minutes of aerobic exercise at least 5 days/week (for weight loss, you may need as much as 60-90 minutes). This can be any activity that gets your heart rate up. This can be divided in 10-15 minute intervals if needed, but try and build up your endurance at least once a week.  Weight bearing exercise is also recommended twice weekly.  Eat a healthy diet with lots of vegetables, fruits and fiber.  "Colorful" foods have a lot of vitamins (ie green vegetables, tomatoes, red peppers, etc).  Limit sweet tea, regular sodas and alcoholic beverages, all of which has a lot of calories and sugar.  Up to 1 alcoholic drink daily may be beneficial for women (unless trying to lose weight, watch sugars).  Drink a lot of water.  Calcium recommendations are 1200-1500 mg daily (1500 mg for postmenopausal women or women without ovaries), and vitamin D 1000 IU daily.  This should be obtained from diet and/or supplements (vitamins), and calcium should not be taken all at once, but in divided doses.  Monthly self breast exams and yearly mammograms for women over the age of 81 is recommended.  Sunscreen of at least SPF 30 should be used on all sun-exposed parts of the skin when outside between the hours of 10 am and 4 pm (not just when at beach or pool, but even with exercise, golf, tennis, and yard work!)  Use a sunscreen that says "broad spectrum" so it covers both UVA and UVB rays, and make sure to reapply every 1-2 hours.  Remember to change the batteries in your smoke detectors when changing your clock times in the spring and fall.  Use your seat belt every time you are in a car, and please drive safely and not be distracted with cell phones and texting while driving.    [name] , Thank you for taking time to come for your Medicare Wellness Visit. I appreciate your ongoing commitment to your health goals. Please review the  following plan we discussed and let me know if I can assist you in the future.   These are the goals we discussed: Goals    None      This is a list of the screening recommended for you and due dates:  Health Maintenance  Topic Date Due  . Tetanus Vaccine  12/15/1946  . Pneumonia vaccines (1 of 2 - PCV13) 12/16/1994  . Flu Shot  08/20/2017*  . DEXA scan (bone density measurement)  Completed  *Topic was postponed. The date shown is not the original due date.   Bone density was not completed as stated above.  This is still recommended, and order is in system. You just need to call the [facility] to schedule should you change your mind.   PLEASE try and stop smoking; don't smoke with your daughter--keep your home smoke-free.  Switch from a fiber pill to a powder--this ensures that you drink enough water with it. The fiber supplement should also help prevent constipation and keep your stools bulked up. I don't recommend taking a stool softener unless your stools are hard.  Since you have been having loose stools and accidents, hold this for a while, and restart only if needed.  Please stop picking at that right ear.  If the area doesn't heal, you will need to have it evaluated by a dermatologist.  I highly recommend a bone density test, as discussed, to help  determine just how weak your bones are, and whether you would benefit from a bone building medication.  Your insurance may not cover it without this testing (they didn't cover the miacalcin, or else I would have recommended just staying on that--it isn't as good as some of the other medications, but is better than nothing).  I also strongly encourage routine vaccinations as we discussed.  If you change your mind, let us know. Flu shot, pneumonia vaccines and Shingles vaccine, as well as routine tetanus shots are recommended.

## 2017-03-28 ENCOUNTER — Encounter: Payer: Self-pay | Admitting: Family Medicine

## 2017-03-29 LAB — TEST AUTHORIZATION

## 2017-03-29 LAB — TIQ-NTM

## 2017-03-29 LAB — LIPID PANEL
Cholesterol: 182 mg/dL (ref <200)
HDL: 53 mg/dL (ref >50)
LDL CHOLESTEROL (CALC): 99 mg/dL
NON-HDL CHOLESTEROL (CALC): 129 mg/dL (ref <130)
Total CHOL/HDL Ratio: 3.4 (calc) (ref <5.0)
Triglycerides: 207 mg/dL — ABNORMAL HIGH (ref <150)

## 2017-03-29 LAB — TSH: TSH: 2.3 m[IU]/L (ref 0.40–4.50)

## 2017-03-30 ENCOUNTER — Other Ambulatory Visit: Payer: Self-pay | Admitting: Family Medicine

## 2017-03-30 DIAGNOSIS — I10 Essential (primary) hypertension: Secondary | ICD-10-CM

## 2017-06-03 DIAGNOSIS — H43813 Vitreous degeneration, bilateral: Secondary | ICD-10-CM | POA: Diagnosis not present

## 2017-06-03 DIAGNOSIS — H353231 Exudative age-related macular degeneration, bilateral, with active choroidal neovascularization: Secondary | ICD-10-CM | POA: Diagnosis not present

## 2017-06-03 DIAGNOSIS — H43823 Vitreomacular adhesion, bilateral: Secondary | ICD-10-CM | POA: Diagnosis not present

## 2017-06-03 DIAGNOSIS — H35423 Microcystoid degeneration of retina, bilateral: Secondary | ICD-10-CM | POA: Diagnosis not present

## 2017-06-19 ENCOUNTER — Telehealth: Payer: Self-pay | Admitting: Cardiology

## 2017-06-19 ENCOUNTER — Encounter: Payer: Medicare HMO | Admitting: *Deleted

## 2017-06-19 NOTE — Telephone Encounter (Signed)
LMOVM reminding pt to send remote transmission.   

## 2017-06-20 ENCOUNTER — Encounter: Payer: Self-pay | Admitting: Cardiology

## 2017-06-21 ENCOUNTER — Encounter: Payer: Self-pay | Admitting: Cardiovascular Disease

## 2017-06-24 ENCOUNTER — Telehealth: Payer: Self-pay | Admitting: Family Medicine

## 2017-06-24 ENCOUNTER — Ambulatory Visit (INDEPENDENT_AMBULATORY_CARE_PROVIDER_SITE_OTHER): Payer: Medicare HMO | Admitting: *Deleted

## 2017-06-24 DIAGNOSIS — I495 Sick sinus syndrome: Secondary | ICD-10-CM

## 2017-06-24 DIAGNOSIS — I10 Essential (primary) hypertension: Secondary | ICD-10-CM

## 2017-06-24 MED ORDER — FUROSEMIDE 40 MG PO TABS: 40.0000 mg | ORAL_TABLET | Freq: Every day | ORAL | 0 refills | Status: DC

## 2017-06-24 NOTE — Telephone Encounter (Signed)
Done

## 2017-06-24 NOTE — Telephone Encounter (Signed)
[facility] req Furoseminde 40 mg  #90 Last Med well 03/27/17

## 2017-06-25 NOTE — Progress Notes (Signed)
Remote pacemaker transmission.   

## 2017-06-26 ENCOUNTER — Encounter: Payer: Self-pay | Admitting: Cardiology

## 2017-07-03 LAB — CUP PACEART REMOTE DEVICE CHECK
Battery Impedance: 3859 Ohm
Battery Remaining Longevity: 12 mo
Battery Voltage: 2.67 V
Brady Statistic AP VS Percent: 70 %
Date Time Interrogation Session: 20190211213823
Implantable Lead Implant Date: 19940815
Implantable Lead Implant Date: 19940815
Implantable Lead Location: 753859
Implantable Pulse Generator Implant Date: 20071015
Lead Channel Impedance Value: 410 Ohm
Lead Channel Pacing Threshold Pulse Width: 0.4 ms
Lead Channel Pacing Threshold Pulse Width: 0.4 ms
Lead Channel Setting Pacing Amplitude: 2 V
Lead Channel Setting Pacing Pulse Width: 0.4 ms
Lead Channel Setting Sensing Sensitivity: 2 mV
MDC IDC LEAD LOCATION: 753860
MDC IDC MSMT LEADCHNL RA IMPEDANCE VALUE: 369 Ohm
MDC IDC MSMT LEADCHNL RA PACING THRESHOLD AMPLITUDE: 0.625 V
MDC IDC MSMT LEADCHNL RV PACING THRESHOLD AMPLITUDE: 1 V
MDC IDC SET LEADCHNL RA PACING AMPLITUDE: 1.5 V
MDC IDC STAT BRADY AP VP PERCENT: 26 %
MDC IDC STAT BRADY AS VP PERCENT: 1 %
MDC IDC STAT BRADY AS VS PERCENT: 3 %

## 2017-07-09 ENCOUNTER — Telehealth: Payer: Self-pay | Admitting: Cardiovascular Disease

## 2017-07-09 DIAGNOSIS — R5383 Other fatigue: Secondary | ICD-10-CM

## 2017-07-09 NOTE — Telephone Encounter (Signed)
LMTCB

## 2017-07-09 NOTE — Telephone Encounter (Signed)
We have had some issues with oversensing on her atrial lead (unipolar lead 84 years old), less so on the ventricular lead. Can we please have her wear a 24 hour Holter monitor and see if she has any pauses? Please make sure she accurately records a diary with the times she has the fainting feeling, while wearing the monitor. Thanks

## 2017-07-09 NOTE — Telephone Encounter (Signed)
New message    STAT if patient feels like he/she is going to faint   1) Are you dizzy now? NO  2) Do you feel faint or have you passed out? NO  3) Do you have any other symptoms? Feels more tired than normal  4) Have you checked your HR and BP (record if available)? HR 60

## 2017-07-09 NOTE — Telephone Encounter (Signed)
Returned call to patient of Dr. C who reports she has been feeling more tired than normal, she wants to sleep a lot - this is not acute, has been going on a while. She does not eat a whole lot, she is losing weight. She is drinking fluids fine. Her BP has been OK. Her HR is in the 60s. Her med list is up to date. She sent a transmission a few weeks ago and got a letter that it was OK.   Her insurance only allows her to see the doctor (cardiologist) once a year. She is scheduled May 21  Advised would send a message to the MD to review and advise

## 2017-07-09 NOTE — Telephone Encounter (Signed)
Daughter returned call. Notified her of MD recommendations. Agreed w/plan. Order for monitor placed in Epic. Daughter is aware a scheduler will call them for an appointment, this week is ideal who patient as her daughter is staying with her all week.

## 2017-07-10 ENCOUNTER — Other Ambulatory Visit: Payer: Self-pay | Admitting: Cardiovascular Disease

## 2017-07-10 ENCOUNTER — Ambulatory Visit (INDEPENDENT_AMBULATORY_CARE_PROVIDER_SITE_OTHER): Payer: Medicare HMO

## 2017-07-10 DIAGNOSIS — R55 Syncope and collapse: Secondary | ICD-10-CM | POA: Diagnosis not present

## 2017-07-10 DIAGNOSIS — I495 Sick sinus syndrome: Secondary | ICD-10-CM | POA: Diagnosis not present

## 2017-07-10 DIAGNOSIS — R5383 Other fatigue: Secondary | ICD-10-CM | POA: Diagnosis not present

## 2017-08-02 ENCOUNTER — Ambulatory Visit (INDEPENDENT_AMBULATORY_CARE_PROVIDER_SITE_OTHER): Payer: Medicare HMO | Admitting: Cardiovascular Disease

## 2017-08-02 ENCOUNTER — Encounter: Payer: Self-pay | Admitting: Cardiovascular Disease

## 2017-08-02 VITALS — BP 142/78 | HR 135 | Ht 64.0 in | Wt 139.8 lb

## 2017-08-02 DIAGNOSIS — I10 Essential (primary) hypertension: Secondary | ICD-10-CM

## 2017-08-02 DIAGNOSIS — I471 Supraventricular tachycardia: Secondary | ICD-10-CM | POA: Diagnosis not present

## 2017-08-02 DIAGNOSIS — I5032 Chronic diastolic (congestive) heart failure: Secondary | ICD-10-CM | POA: Insufficient documentation

## 2017-08-02 DIAGNOSIS — Z950 Presence of cardiac pacemaker: Secondary | ICD-10-CM

## 2017-08-02 DIAGNOSIS — I495 Sick sinus syndrome: Secondary | ICD-10-CM

## 2017-08-02 DIAGNOSIS — E781 Pure hyperglyceridemia: Secondary | ICD-10-CM

## 2017-08-02 MED ORDER — POTASSIUM CHLORIDE ER 10 MEQ PO TBCR: 10.0000 meq | EXTENDED_RELEASE_TABLET | Freq: Every day | ORAL | 3 refills | Status: DC

## 2017-08-02 MED ORDER — FUROSEMIDE 40 MG PO TABS: 60.0000 mg | ORAL_TABLET | Freq: Every day | ORAL | 3 refills | Status: DC

## 2017-08-02 MED ORDER — FUROSEMIDE 40 MG PO TABS: 40.0000 mg | ORAL_TABLET | Freq: Every day | ORAL | 3 refills | Status: DC

## 2017-08-02 NOTE — Progress Notes (Signed)
Patient ID: Sherri ChadDoris J Atkinson, female   DOB: 09/23/1927, 84 y.o.   MRN: [id number]    Cardiology Office Note    Date:  08/02/2017   ID:  [name], DOB 03/18/1928, MRN [id number]  PCP:  [name], Eve, MD  Cardiologist:   Sherri FairMihai Samul Mcinroy, MD   No chief complaint on file.   History of Present Illness:  [name] is a 84 y.o. female with sinus node dysfunction which has progressed to atrial standstill. She has normal atrioventricular conduction. She is pacemaker dependent (atrial). She initially had a pacemaker implanted in 1994 and has subsequently had 2 generator change out procedures, most recently in 2007. She still has the original unipolar atrial and ventricular leads from 1994.   She is once again having problems with shortness of breath with activity and describes NYHA functional class II-3 exertional dyspnea.  When she first lies down in bed at night she feels that she is smothering, after several minutes she gradually becomes more comfortable.  She has not had any ankle swelling.  She denies dizziness, lightheadedness, palpitations or syncope.  Her weight is actually down a couple of pounds from her last office appointment, but over the last 2 years she has steadily been losing weight, overall roughly 25 pound weight loss.  After her last pacemaker atrial lead sensitivity adjustment, they have been no new episodes of "mode switch" she has occasional episodes of brief high atrial and ventricular rate consistent with paroxysmal atrial tachycardia with 1: 1 AV conduction, usually no longer than about 10 beats.  There is definitely no atrial fibrillation and there is no more "noise". Interrogation of her dual-chamber Medtronic Adapta device shows estimated battery longevity of 8 months. She has 96% atrial pacing and roughly 25% ventricular pacing. R-wave sensing is mediocre, but stable.   Past Medical History:  Diagnosis Date  . Chest wall pain 2003   atypical, now resolved  . CKD  (chronic kidney disease), stage III (HCC)    Cr 1.27, GFR 40 (07/2011)  . Diverticulitis    with DT flare in 6/09  . Gout    R hand  . Hypertension   . Hypertriglyceridemia   . Macular degeneration    Dr. Sharlot Atkinson  . Pacemaker    sees [name]. Pacemaker replaced 2007 [facility])  . Sick sinus syndrome (HCC) with bradycardia  . Tobacco use disorder    quit 01/2013--restarted in 2017  . Urge incontinence     Past Surgical History:  Procedure Laterality Date  . ABDOMINAL HYSTERECTOMY  1978   partial  . APPENDECTOMY  1952  . CARDIAC CATHETERIZATION  2000  . CHOLECYSTECTOMY  1998   Dr. Gerrit Atkinson  . COLON SURGERY  11/2009   vaginal-colon fistula repair (Drs. [name])  . CYSTOSCOPY  2002   Dr. Wanda Atkinson  . EYE SURGERY  11/2004   cataract surgery b/l - [facility]ri[facility]ergen  . PACEMAKER INSERTION  1994, 2000, 2007  . TUBAL LIGATION  1960    Current Medications: Outpatient Medications Prior to Visit  Medication Sig Dispense Refill  . acetaminophen (TYLENOL) 500 MG tablet Take 1,000 mg by mouth every 6 (six) hours as needed.    Marland Kitchen. aspirin 81 MG tablet Take 81 mg by mouth daily.    . calcium carbonate (OS-CAL) 600 MG TABS Take 630 mg by mouth daily.     . metoprolol tartrate (LOPRESSOR) 25 MG tablet Take 1 tablet (25 mg total) by mouth 2 (two) times daily. 180 tablet  1  . Multiple Vitamins-Minerals (MULTIVITAMIN WITH MINERALS) tablet Take 1 tablet by mouth daily.    . Multiple Vitamins-Minerals (OCUVITE PRESERVISION PO) Take 1 tablet by mouth daily.    . Omega-3 Fatty Acids (FISH OIL) 1000 MG CAPS Take 3 capsules by mouth daily.    . polycarbophil (FIBERCON) 625 MG tablet Take 1,875 mg by mouth daily.    . furosemide (LASIX) 40 MG tablet Take 1 tablet (40 mg total) by mouth daily. 90 tablet 0   No facility-administered medications prior to visit.      Allergies:   Ciprofloxacin; Flagyl [metronidazole]; and Morphine and related   Social History   Socioeconomic History  .  Marital status: Divorced    Spouse name: Not on file  . Number of children: 4  . Years of education: Not on file  . Highest education level: Not on file  Occupational History  . Occupation: retired Agricultural engineer  Social Needs  . Financial resource strain: Not on file  . Food insecurity:    Worry: Not on file    Inability: Not on file  . Transportation needs:    Medical: Not on file    Non-medical: Not on file  Tobacco Use  . Smoking status: Former Smoker    Packs/day: 0.50    Years: 40.00    Pack years: 20.00    Last attempt to quit: 01/12/2013    Years since quitting: 4.5  . Smokeless tobacco: Never Used  . Tobacco comment: smokes 1/2 PPD, quite 01/2013, restarted in 2017; smokes more when her daughter who smokes visits  Substance and Sexual Activity  . Alcohol use: No    Alcohol/week: 0.0 oz  . Drug use: No  . Sexual activity: Not Currently  Lifestyle  . Physical activity:    Days per week: Not on file    Minutes per session: Not on file  . Stress: Not on file  Relationships  . Social connections:    Talks on phone: Not on file    Gets together: Not on file    Attends religious service: Not on file    Active member of club or organization: Not on file    Attends meetings of clubs or organizations: Not on file    Relationship status: Not on file  Other Topics Concern  . Not on file  Social History Narrative   Lives alone, no pets (cat died).  2 children Darel Hong and Jonny Ruiz) live at 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     Family History:  The patient's family history includes Alcohol abuse in her brother; Alzheimer's disease (age of onset: 55) in her brother; Cancer in her other and sister; Crohn's disease in her daughter; Diabetes in her brother and mother; Heart disease in her brother, father, and mother; Heart disease (age of onset: 68) in her  other; Hyperlipidemia in her father and mother; Hypertension in her father and mother; Stroke (age of onset: 58) in her other; Ulcerative colitis in her daughter.   ROS:   Please see the history of present illness.    ROS All other systems reviewed and are negative.   PHYSICAL EXAM:   VS:  BP (!) 142/78   Pulse (!) 135   Ht 5\' 4"  (1.626 m)   Wt 139 lb 12.8 oz (63.4 kg)   BMI 24.00 kg/m  General: Alert, oriented x3, no distress, lean Head: no evidence of trauma, PERRL, EOMI, no exophtalmos or lid lag, no myxedema, no xanthelasma; normal ears, nose and oropharynx Neck: Mildly elevated jugular venous pulsations 6-8 cm; brisk carotid pulses without delay and no carotid bruits Chest: clear to auscultation, no signs of consolidation by percussion or palpation, normal fremitus, symmetrical and full respiratory excursions, healthy left subclavian pacemaker site Cardiovascular: normal position and quality of the apical impulse, regular rhythm, normal first and second heart sounds, no murmurs, rubs or gallops Abdomen: no tenderness or distention, no masses by palpation, no abnormal pulsatility or arterial bruits, normal bowel sounds, no hepatosplenomegaly Extremities: no clubbing, cyanosis or edema; 2+ radial, ulnar and brachial pulses bilaterally; 2+ right femoral, posterior tibial and dorsalis pedis pulses; 2+ left femoral, posterior tibial and dorsalis pedis pulses; no subclavian or femoral bruits Neurological: grossly nonfocal Psych: Normal mood and affect   Wt Readings from Last 3 Encounters:  08/02/17 139 lb 12.8 oz (63.4 kg)  03/27/17 142 lb (64.4 kg)  03/04/17 141 lb 6.4 oz (64.1 kg)      Studies/Labs Reviewed:   EKG:  EKG is ordered today.  The ekg ordered today atrial paced ventricular sensed rhythm, otherwise normal  Recent Labs: 02/25/2017: ALT 6; Hemoglobin 13.2; Platelets 225 03/04/2017: BUN 20; Creat 1.01; Potassium 4.6; Sodium 137 03/27/2017: TSH 2.30   Lipid  Panel    Component Value Date/Time   CHOL 182 03/27/2017 1023   TRIG 207 (H) 03/27/2017 1023   HDL 53 03/27/2017 1023   CHOLHDL 3.4 03/27/2017 1023   VLDL 47 (H) 03/01/2016 1436   LDLCALC 99 03/27/2017 1023    ASSESSMENT:    1. Chronic diastolic heart failure (HCC)   2. SSS (sick sinus syndrome) (HCC)   3. Pacemaker   4. Essential hypertension, benign   5. Paroxysmal atrial tachycardia (HCC)   6. Pure hyperglyceridemia      PLAN:  In order of problems listed above:  1. CHF: She has some subtle symptoms and findings of diastolic heart failure.  We will increase her diuretic and add a potassium supplement.  Her echo last March showed clear-cut evidence of elevated filling pressures despite normal systolic function.  I suspect that some of her fluid gain may be masked by a steady reduction in true weight. 2. SSS: Her heart rate histogram distribution is extremely blunted, but she is also very sedentary. 3. PM: Device function is normal.  Generator approaching ERI.  I think we will be able to get away with just a generator change out rather than needing to place new leads later this year. 4. HTN: borderline blood pressure control 5. PAT: Episodes are brief and infrequent tolerating low-dose beta blocker therapy. 6. HLP: satisfactory lipid profile for a patient without established coronary or vascular stenoses.  Mild hypertriglyceridemia does not require additional therapy    Medication Adjustments/Labs and Tests Ordered: Current medicines are reviewed at length with the patient today.  Concerns regarding medicines are outlined above.  Medication changes, Labs and Tests ordered today are listed in the Patient Instructions below. Patient Instructions  Dr Royann Shivers has recommended making the following medication changes: 1. INCREASE Furosemide to 60 mg daily 2. START Potassium 10 mEq daily  Your physician recommends that you schedule a follow-up appointment in 3 months with a  pacemaker check.  If you need a refill on your cardiac medications before your next appointment, please call your pharmacy.    Signed, Sherri Fair, MD  08/02/2017 2:02 PM    [facility] [address] [place], [place], [place]  [address] Phone: [phone]; Fax: [phone]

## 2017-08-02 NOTE — Patient Instructions (Signed)
Dr Royann Shiversroitoru has recommended making the following medication changes: 1. INCREASE Furosemide to 60 mg daily 2. START Potassium 10 mEq daily  Your physician recommends that you schedule a follow-up appointment in 3 months with a pacemaker check.  If you need a refill on your cardiac medications before your next appointment, please call your pharmacy.

## 2017-08-06 NOTE — Addendum Note (Signed)
Addended by: Armen PickupWYSOR, Darnel Mchan T on: 08/06/2017 10:58 AM   Modules accepted: Orders

## 2017-09-02 LAB — CUP PACEART INCLINIC DEVICE CHECK
Implantable Lead Implant Date: 19940815
Implantable Lead Location: 753859
Implantable Pulse Generator Implant Date: 20071015
Lead Channel Setting Sensing Sensitivity: 2 mV
MDC IDC LEAD IMPLANT DT: 19940815
MDC IDC LEAD LOCATION: 753860
MDC IDC SESS DTM: 20190422083342
MDC IDC SET LEADCHNL RA PACING AMPLITUDE: 1.5 V
MDC IDC SET LEADCHNL RV PACING AMPLITUDE: 2 V
MDC IDC SET LEADCHNL RV PACING PULSEWIDTH: 0.4 ms

## 2017-09-09 DIAGNOSIS — H43823 Vitreomacular adhesion, bilateral: Secondary | ICD-10-CM | POA: Diagnosis not present

## 2017-09-09 DIAGNOSIS — H353211 Exudative age-related macular degeneration, right eye, with active choroidal neovascularization: Secondary | ICD-10-CM | POA: Diagnosis not present

## 2017-09-09 DIAGNOSIS — H35423 Microcystoid degeneration of retina, bilateral: Secondary | ICD-10-CM | POA: Diagnosis not present

## 2017-09-09 DIAGNOSIS — H353223 Exudative age-related macular degeneration, left eye, with inactive scar: Secondary | ICD-10-CM | POA: Diagnosis not present

## 2017-09-17 ENCOUNTER — Other Ambulatory Visit: Payer: Self-pay | Admitting: Family Medicine

## 2017-09-17 DIAGNOSIS — I10 Essential (primary) hypertension: Secondary | ICD-10-CM

## 2017-09-23 ENCOUNTER — Encounter: Payer: Medicare HMO | Admitting: *Deleted

## 2017-09-23 ENCOUNTER — Telehealth: Payer: Self-pay | Admitting: Cardiology

## 2017-09-23 NOTE — Telephone Encounter (Signed)
Confirmed remote transmission w/ pt daughter. Pt daughter stated that pt will be upset b/c she is seeing MD on 10-01-17 and she does not want to be billed twice. Explained to pt daughter that pt has the right to refuse the remote transmission.  Pt daughter verbalized understanding.

## 2017-09-25 NOTE — Progress Notes (Signed)
Chief Complaint  Patient presents with  . Hypertension    nonfasting med check, BMET.    Patient presents today accompanied by her daughter Jackelyn Poling, for routine follow-up.    She reports having vision change today--seeing different colors on her TV intermittently. Spoke with eye doctor, told poss "retinal migraine", to get rest and f/u if not improving.  It seems to come and go, not bothering her currently. No headache or nausea.  Some dizziness today if she moves quickly, stands up.  Hypertension follow-up: Blood pressures are occasionally checked, and have been good,120's-130/70's. Denies dizziness (just slight today, as mentioned above, with the vision change); no headaches, chest pain. Denies side effects of medications. She has been taking lasix daily for many years. Recently her dose was increased to 30m (and KCL added, end of March by Dr. CLoletha Grayer. She took 626mas directed by Dr. C Loletha Grayeror only the first 2 weeks, because she was running low, then went back down to 4064m Has only been taking 32m34mr the last 2 weeks.  She takes 10 mEq of potassium daily--she is questioning if/why she needs this, as it is costing $44/90d. Denies sweling--denies any change in swelling since lasix dose decreased, and breathing also hasn't changed. She is noting some increased shortness of breath, even when walking slowly, which is a change from prior visit with me.  Denies muscle cramps. She was started on low dose beta blocker for PAT noted by cardiologist last year.Tolerating this without problems.  She gets regular pacemaker checks. She last saw Dr. CroiRecardo Evangelist3/29/19.  Med changes as above (Lasix, K).  He noted that she will soon need new generator for PM.  She has brief PAT, and is tolerating low dose beta blocker. Due for f/u with Dr. CroiRecardo EvangelistJune per notes, but family reports f/u was changed to 5/21. They are requesting b-met be done today. Taking weights at home, staying steady in the 136-139 range at  home  Hypertriglyceridemia: On no meds currently, as Dr. CroiRecardo Evangelistpped her Gemfibrozil 08/2015. Per his notes, felt that she has a satisfactory lipid profileforapatient without established coronary or vascular stenoses. Reports she follows a lowfat diet. Last lipids: Lab Results  Component Value Date   CHOL 182 03/27/2017   HDL 53 03/27/2017   LDLCALC 99 03/27/2017   TRIG 207 (H) 03/27/2017   CHOLHDL 3.4 03/27/2017   Fecal incontinence when on stool softeners last year.  She refuses to stop the stool softeners--h/o impaction, daughter states she is fixated on her bowels, fears constipation, recurrent impaction.    Compression fracture at L3 in 02/2017 after a fall. She was treated with miacalcin (to help alleviate pain from compression fracture), as well as muscle relaxants (as there was a muscular component to her pain that started prior to fall).  She was referred for PT, and used topical patches (Lidoderm wasn't covered). She reports her back pain improved, so never went for PT. Stopped using the Miacalcin when her back felt better.  Not interested in going for the DEXA we discussed.  Not interested in treatment  Tobacco use:  She smoked 1/2 PPD x 40 years, quit in 2014, but restarted 2-3 years ago, now smoking 4-5/day, after eating. She previously reported that she really doesn't have much else to enjoy, with her losing her hearing, vision, being unable to drive. One of her daughters smokes (MarTomi Bambergernd they tend to smoke more when together.  She confirms this today (she enjoys it, not  interested in quitting).  PMH, PSH, SH reviewed  Outpatient Encounter Medications as of 09/26/2017  Medication Sig Note  . aspirin 81 MG tablet Take 81 mg by mouth daily.   . calcium carbonate (OS-CAL) 600 MG TABS Take 630 mg by mouth daily.  01/05/2015: With D, one daily  . furosemide (LASIX) 40 MG tablet Take 1.5 tablets (60 mg total) by mouth daily.   . metoprolol tartrate (LOPRESSOR) 25 MG  tablet Take 1 tablet (25 mg total) by mouth 2 (two) times daily.   . Multiple Vitamins-Minerals (MULTIVITAMIN WITH MINERALS) tablet Take 1 tablet by mouth daily.   . Multiple Vitamins-Minerals (OCUVITE PRESERVISION PO) Take 1 tablet by mouth daily.   . Omega-3 Fatty Acids (FISH OIL) 1000 MG CAPS Take 3 capsules by mouth daily.   . polycarbophil (FIBERCON) 625 MG tablet Take 1,875 mg by mouth daily.   . potassium chloride (K-DUR) 10 MEQ tablet Take 1 tablet (10 mEq total) by mouth daily.   Marland Kitchen acetaminophen (TYLENOL) 500 MG tablet Take 1,000 mg by mouth every 6 (six) hours as needed.   . [DISCONTINUED] furosemide (LASIX) 40 MG tablet TAKE 1 TABLET BY MOUTH EVERY DAY    No facility-administered encounter medications on file as of 09/26/2017.    Allergies  Allergen Reactions  . Ciprofloxacin Hives    Got hives after 10 days of both cipro and flagyl, not sure which caused the hives  . Flagyl [Metronidazole] Hives    Got hives after 10 days of both cipro and flagyl, not sure which caused the hives  . Morphine And Related Nausea And Vomiting    ROS:  No fever, chills, URI symptoms.  Feels cold a lot, especially her feet, needs to keep the house warm.  Denies chest pain. Some DOE, recent vision changes as per HPI.  No nausea, vomiting. Some loose stools. No urinary complaints. No bleeding, bruising, rash. No headaches, dizziness.  PHYSICAL EXAM:  BP 122/68   Pulse 68   Ht 5' 3.25" (1.607 m)   Wt 141 lb 6.4 oz (64.1 kg)   BMI 24.85 kg/m    Wt Readings from Last 3 Encounters:  09/26/17 141 lb 6.4 oz (64.1 kg)  08/02/17 139 lb 12.8 oz (63.4 kg)  03/27/17 142 lb (64.4 kg)    Pleasant, elderly female HEENT: conjunctiva and sclera are clear, EOMI, OP without erythema or lesions. Neck: no lymphadenopathy or mass Heart: regular rate and rhythm Lungs: clear bilaterally, no wheezes, rales, ronchi Abdomen: soft, nontender, no mass Extremities: no edema. Skin is cool to the touch, but brisk  capillary refill. Back: no spinal tenderness or CVA tenderness.   Neuro: alert and oriented. Cranial nerves intact Skin: some scabbed area at right earlobe (she picks at)--no crusting, erythema or warmth.  Healing areas above pacemaker, R chest.   ASSESSMENT/PLAN:  Essential hypertension, benign - controlled - Plan: Basic metabolic panel  Tobacco abuse - encouraged cessation, risks reviewed in detail  Vertebral compression fracture ([place]) - which therefore indicates osteoporosis. Encouraged baseline DEXA and/or Tx to strengthen bones to prevent further fractures which could affect QOL--declines  PAT (paroxysmal atrial tachycardia) (HCC)  Mixed hyperlipidemia - encouraged lowfat diet. Can stay off meds per cardiologist  Medication monitoring encounter - Plan: Basic metabolic panel  Chronic diastolic heart failure ([place]) - doesn't appear volume overloaded; pt decreased diuretic dose. Check labs. f/u with Dr. Loletha Grayer next week as planned   Discussed osteoporosis meds, risks and consequences of fracture, that DEXA is non-invasive,  and potential meds. Discussed smoking cessation and encouraged her daughter Tomi Bamberger not to smoke with her.  b-met today; send copy of labs to Dr. Recardo Evangelist.    Stop taking the stool softeners when having diarrhea. You can restart them if your bowels get harder, if any straining or firm stools.   Consider a bone density test and bone strengthening medications in order to help prevent future spine or hip fractures.  Please consider quitting smoking entirely.

## 2017-09-26 ENCOUNTER — Ambulatory Visit (INDEPENDENT_AMBULATORY_CARE_PROVIDER_SITE_OTHER): Payer: Medicare HMO | Admitting: Family Medicine

## 2017-09-26 ENCOUNTER — Encounter: Payer: Self-pay | Admitting: Cardiology

## 2017-09-26 VITALS — BP 122/68 | HR 68 | Ht 63.25 in | Wt 141.4 lb

## 2017-09-26 DIAGNOSIS — I10 Essential (primary) hypertension: Secondary | ICD-10-CM | POA: Diagnosis not present

## 2017-09-26 DIAGNOSIS — M4850XA Collapsed vertebra, not elsewhere classified, site unspecified, initial encounter for fracture: Secondary | ICD-10-CM | POA: Diagnosis not present

## 2017-09-26 DIAGNOSIS — I5032 Chronic diastolic (congestive) heart failure: Secondary | ICD-10-CM | POA: Diagnosis not present

## 2017-09-26 DIAGNOSIS — E782 Mixed hyperlipidemia: Secondary | ICD-10-CM | POA: Diagnosis not present

## 2017-09-26 DIAGNOSIS — Z720 Tobacco use: Secondary | ICD-10-CM | POA: Diagnosis not present

## 2017-09-26 DIAGNOSIS — I471 Supraventricular tachycardia: Secondary | ICD-10-CM

## 2017-09-26 DIAGNOSIS — Z5181 Encounter for therapeutic drug level monitoring: Secondary | ICD-10-CM

## 2017-09-26 LAB — BASIC METABOLIC PANEL
BUN / CREAT RATIO: 18 (ref 12–28)
BUN: 26 mg/dL (ref 8–27)
CO2: 28 mmol/L (ref 20–29)
CREATININE: 1.48 mg/dL — AB (ref 0.57–1.00)
Calcium: 9.8 mg/dL (ref 8.7–10.3)
Chloride: 102 mmol/L (ref 96–106)
GFR calc non Af Amer: 32 mL/min/{1.73_m2} — ABNORMAL LOW (ref >59)
GFR, EST AFRICAN AMERICAN: 36 mL/min/{1.73_m2} — AB (ref >59)
Glucose: 114 mg/dL — ABNORMAL HIGH (ref 65–99)
Potassium: 4.5 mmol/L (ref 3.5–5.2)
SODIUM: 143 mmol/L (ref 134–144)

## 2017-09-26 NOTE — Patient Instructions (Signed)
  Stop taking the stool softeners when having diarrhea. You can restart them if your bowels get harder, if any straining or firm stools.   Consider a bone density test and bone strengthening medications in order to help prevent future spine or hip fractures.  Please consider quitting smoking entirely.

## 2017-09-27 ENCOUNTER — Encounter: Payer: Self-pay | Admitting: Family Medicine

## 2017-10-01 ENCOUNTER — Encounter: Payer: Self-pay | Admitting: Cardiovascular Disease

## 2017-10-01 ENCOUNTER — Ambulatory Visit (INDEPENDENT_AMBULATORY_CARE_PROVIDER_SITE_OTHER): Payer: Medicare HMO | Admitting: Cardiovascular Disease

## 2017-10-01 VITALS — BP 124/73 | HR 64 | Ht 64.0 in | Wt 139.6 lb

## 2017-10-01 DIAGNOSIS — I10 Essential (primary) hypertension: Secondary | ICD-10-CM | POA: Diagnosis not present

## 2017-10-01 DIAGNOSIS — I471 Supraventricular tachycardia: Secondary | ICD-10-CM | POA: Diagnosis not present

## 2017-10-01 DIAGNOSIS — Z45010 Encounter for checking and testing of cardiac pacemaker pulse generator [battery]: Secondary | ICD-10-CM | POA: Diagnosis not present

## 2017-10-01 DIAGNOSIS — Z950 Presence of cardiac pacemaker: Secondary | ICD-10-CM | POA: Diagnosis not present

## 2017-10-01 DIAGNOSIS — I495 Sick sinus syndrome: Secondary | ICD-10-CM | POA: Insufficient documentation

## 2017-10-01 DIAGNOSIS — E782 Mixed hyperlipidemia: Secondary | ICD-10-CM | POA: Diagnosis not present

## 2017-10-01 DIAGNOSIS — I5032 Chronic diastolic (congestive) heart failure: Secondary | ICD-10-CM

## 2017-10-01 NOTE — H&P (View-Only) (Signed)
Patient ID: [name], female   DOB: 09-23-1927, 84 y.o.   MRN: [id number]    Cardiology Office Note    Date:  10/01/2017   ID:  [name], [name] 1927/06/18, MRN [id number]  PCP:  Sherri Arrow, MD  Cardiologist:   Sherri Fair, MD   Chief Complaint  Patient presents with  . Follow-up  CHF, pacemaker at ERI  History of Present Illness:  [name] is a 84 y.o. female with sinus node dysfunction which has progressed to atrial standstill. She has normal atrioventricular conduction. She is pacemaker dependent (atrial). She initially had a pacemaker implanted in 1994 and has subsequently had 2 generator change out procedures, most recently in 2007. She still has the original unipolar atrial and ventricular leads from 1994.   Shortness of breath is a chronic complaint, but is been worse over the last 2 weeks.  Interrogation of her pacemaker shows that it reached ERI on May 9, roughly 2 weeks ago.  She now has 100% ventricular pacing.  It is likely that loss of AV synchrony is contributing to her symptoms.  She does not like taking higher doses of diuretics and has curtailed her furosemide to 40 mg once daily.  Denies angina syncope palpitations orthopnea or PND, but has mild symmetrical ankle swelling.  Device interrogation is limited today due to the ERI switch.  She is pacing 100% in the ventricle and there is no discernible atrial activity on her surface ECG.  Therefore unable to tell whether there is still any issues with atrial over sensing due to "atrial lead noise".  Prior to the mode switch she had 96% atrial pacing and only 25% ventricular pacing. R-wave sensing at 2 mV is mediocre, but stable.   Past Medical History:  Diagnosis Date  . Chest wall pain 2003   atypical, now resolved  . CKD (chronic kidney disease), stage III (HCC)    Cr 1.27, GFR 40 (07/2011)  . Diverticulitis    with DT flare in 6/09  . Gout    R hand  . Hypertension   . Hypertriglyceridemia   .  Macular degeneration    Dr. Sharlot Gowda  . Pacemaker    sees Dr. Rubie Maid [facility]) annually. Pacemaker replaced 2007 [facility])  . Sick sinus syndrome (HCC) with bradycardia  . Tobacco use disorder    quit 01/2013--restarted in 2017  . Urge incontinence     Past Surgical History:  Procedure Laterality Date  . ABDOMINAL HYSTERECTOMY  1978   partial  . APPENDECTOMY  1952  . CARDIAC CATHETERIZATION  2000  . CHOLECYSTECTOMY  1998   Dr. Gerrit Friends  . COLON SURGERY  11/2009   vaginal-colon fistula repair (Drs. Delane Ginger)  . CYSTOSCOPY  2002   Dr. Wanda Plump  . EYE SURGERY  11/2004   cataract surgery b/l - Dr.Bergen  . PACEMAKER INSERTION  1994, 2000, 2007  . TUBAL LIGATION  1960    Current Medications: Outpatient Medications Prior to Visit  Medication Sig Dispense Refill  . acetaminophen (TYLENOL) 500 MG tablet Take 1,000 mg by mouth every 6 (six) hours as needed.    Marland Kitchen aspirin 81 MG tablet Take 81 mg by mouth daily.    . calcium carbonate (OS-CAL) 600 MG TABS Take 630 mg by mouth daily.     . furosemide (LASIX) 40 MG tablet Take 1.5 tablets (60 mg total) by mouth daily. 135 tablet 3  . metoprolol tartrate (LOPRESSOR) 25 MG tablet Take 1 tablet (25 mg total)  by mouth 2 (two) times daily. 180 tablet 1  . Multiple Vitamins-Minerals (MULTIVITAMIN WITH MINERALS) tablet Take 1 tablet by mouth daily.    . Multiple Vitamins-Minerals (OCUVITE PRESERVISION PO) Take 1 tablet by mouth daily.    . Omega-3 Fatty Acids (FISH OIL) 1000 MG CAPS Take 3 capsules by mouth daily.    . polycarbophil (FIBERCON) 625 MG tablet Take 1,875 mg by mouth daily.    . potassium chloride (K-DUR) 10 MEQ tablet Take 1 tablet (10 mEq total) by mouth daily. 90 tablet 3   No facility-administered medications prior to visit.      Allergies:   Ciprofloxacin; Flagyl [metronidazole]; and Morphine and related   Social History   Socioeconomic History  . Marital status: Divorced    Spouse name: Not on file  . Number of children:  4  . Years of education: Not on file  . Highest education level: Not on file  Occupational History  . Occupation: retired Agricultural engineer  Social Needs  . Financial resource strain: Not on file  . Food insecurity:    Worry: Not on file    Inability: Not on file  . Transportation needs:    Medical: Not on file    Non-medical: Not on file  Tobacco Use  . Smoking status: Current Some Day Smoker    Packs/day: 0.50    Years: 40.00    Pack years: 20.00    Last attempt to quit: 01/12/2013    Years since quitting: 4.7  . Smokeless tobacco: Never Used  . Tobacco comment: smokes 1/2 PPD, quite 01/2013, restarted in 2017; smokes more when her daughter who smokes visits  Substance and Sexual Activity  . Alcohol use: No    Alcohol/week: 0.0 oz  . Drug use: No  . Sexual activity: Not Currently  Lifestyle  . Physical activity:    Days per week: Not on file    Minutes per session: Not on file  . Stress: Not on file  Relationships  . Social connections:    Talks on phone: Not on file    Gets together: Not on file    Attends religious service: Not on file    Active member of club or organization: Not on file    Attends meetings of clubs or organizations: Not on file    Relationship status: Not on file  Other Topics Concern  . Not on file  Social History Narrative   Lives alone, no pets (cat died).  2 children Darel Hong and Jonny Ruiz) live at 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     Family History:  The patient's family history includes Alcohol abuse in her brother; Alzheimer's disease (age of onset: 23) in her brother; Cancer in her other and sister; Crohn's disease in her daughter; Diabetes in her brother and mother; Heart disease in her brother, father, and mother; Heart disease (age of onset: 59) in her other; Hyperlipidemia in her father and mother; Hypertension in her  father and mother; Stroke (age of onset: 58) in her other; Ulcerative colitis in her daughter.   ROS:   Please see the history of present illness.    ROS All other systems reviewed and are negative.   PHYSICAL EXAM:   VS:  BP 124/73   Pulse 64   Ht  (1.626 m)   Wt 139 lb 9.6  oz (63.3 kg)   BMI 23.96 kg/m      General: Alert, oriented x3, no distress, appears uncomfortable Head: no evidence of trauma, PERRL, EOMI, no exophtalmos or lid lag, no myxedema, no xanthelasma; normal ears, nose and oropharynx Neck: 1-8 cm elevation in jugular venous pulsations and no hepatojugular reflux; brisk carotid pulses without delay and no carotid bruits Chest: clear to auscultation, no signs of consolidation by percussion or palpation, normal fremitus, symmetrical and full respiratory excursions Cardiovascular: normal position and quality of the apical impulse, regular rhythm, normal first and paradoxically split second heart sounds, no murmurs, rubs or gallops.  Healthy right subclavian pacemaker site, albeit with some fat atrophy. Abdomen: no tenderness or distention, no masses by palpation, no abnormal pulsatility or arterial bruits, normal bowel sounds, no hepatosplenomegaly Extremities: no clubbing, cyanosis, there is 1+ symmetrical ankle and pretibial edema; 2+ radial, ulnar and brachial pulses bilaterally; 2+ right femoral, posterior tibial and dorsalis pedis pulses; 2+ left femoral, posterior tibial and dorsalis pedis pulses; no subclavian or femoral bruits Neurological: grossly nonfocal, very hard of hearing Psych: Normal mood and affect    Wt Readings from Last 3 Encounters:  10/01/17 139 lb 9.6 oz (63.3 kg)  09/26/17 141 lb 6.4 oz (64.1 kg)  08/02/17 139 lb 12.8 oz (63.4 kg)      Studies/Labs Reviewed:   EKG:  EKG is ordered today.  The ekg ordered today shows 100% ventricular paced rhythm without any discernible atrial activity  Recent Labs: 02/25/2017: ALT 6; Hemoglobin 13.2;  Platelets 225 03/27/2017: TSH 2.30 09/26/2017: BUN 26; Creatinine, Ser 1.48; Potassium 4.5; Sodium 143   Lipid Panel    Component Value Date/Time   CHOL 182 03/27/2017 1023   TRIG 207 (H) 03/27/2017 1023   HDL 53 03/27/2017 1023   CHOLHDL 3.4 03/27/2017 1023   VLDL 47 (H) 03/01/2016 1436   LDLCALC 99 03/27/2017 1023    ASSESSMENT:    1. Chronic diastolic heart failure (HCC)   2. Sick sinus syndrome (HCC)   3. Pacemaker battery depletion   4. Essential hypertension   5. PAT (paroxysmal atrial tachycardia) (HCC)   6. Mixed hyperlipidemia      PLAN:  In order of problems listed above:  1. CHF: She is still hypervolemic and has cut back her diuretic.  I think she should be on the higher dose of diuretic but as long as her shortness of breath is tolerable I did not force the issue today.  Continue the potassium supplement.  Advised her to increase the diuretic dose if her breathing becomes any more difficult than it is today and seek urgent medical attention if necessary.  He is more prone to heart failure decompensation now that she has lost her "atrial kick". 2. SSS: Had virtually 100% atrial pacing before the mode switch. 3. PM battery depletion: Although lead parameters were not optimal, they are acceptable.  We will recheck again in the procedure room, but I am highly reluctant to implant new leads considering her multiple previous procedures and advanced age. This procedure has been fully reviewed with the patient and written informed consent has been obtained.  She is "atrially dependent" and has had multiple procedures performed. Will recommend implantation of an antibiotic TyRx pouch.  At this point, the first available procedure date is June 18, but will try to bring her in sooner if scheduling allows. Has some degree of pacemaker syndrome.  4. HTN: Good control 5. PAT: Unable to evaluate the overall burden  since her last device check, but beta-blocker seems to be working  well. 6. HLP: LDL less than 100 and acceptable, since she does not have coronary vascular disease.  I would not recommend therapy for her mild hypertriglyceridemia.    Medication Adjustments/Labs and Tests Ordered: Current medicines are reviewed at length with the patient today.  Concerns regarding medicines are outlined above.  Medication changes, Labs and Tests ordered today are listed in the Patient Instructions below. Patient Instructions  Pacemaker Battery Change A pacemaker battery usually lasts 5-15 years (6-7 years on average). A few times a year, you will be asked to visit your health care provider to have a full evaluation of your pacemaker. When the battery is low, your pacemaker battery and generator will be completely replaced. Most often, this procedure is simpler than the first surgery because the wires (leads) that connect the generator to the heart are already in place. There are many things that affect how long a pacemaker battery will last, including:  The age of the pacemaker.  The number of leads you have(1, 2, or 3).  The pacemaker workload. If the pacemaker is helping the heart more often, the battery will not last as long.  Power (voltage) settings.  Tell a health care provider about:  Any allergies you have.  All medicines you are taking, including vitamins, herbs, eye drops, creams, and over-the-counter medicines.  Any problems you or family members have had with anesthetic medicines.  Any blood disorders you have.  Any surgeries you have had, especially the surgeries you have had since your last pacemaker was placed.  Any medical conditions you have.  Whether you are pregnant or may be pregnant.  Any symptoms of heart problems, such as chest pain, trouble breathing, palpitations, light-headedness, or feelings of an abnormal or irregular heartbeat.  Smoking habits. This can affect your reaction to anesthesia. What are the risks? Generally, this is a  safe procedure. However, problems may occur, including:  Bleeding.  Bruising of the skin around where the surgical cut (incision) was made.  Pulling apart of the skin at the incision site.  Infection.  Nerve damage.  Injury to other organs, such as the lungs.  Allergic reaction to anesthetics or other medicines used during the procedure.  People with diabetes may have a temporary increase in blood sugar (glucose) after any surgical procedure.  What happens before the procedure? Staying hydrated Follow instructions from your health care provider about hydration, which may include:  Up to 2 hours before the procedure - you may continue to drink clear liquids, such as water, clear fruit juice, black coffee, and plain tea.  Eating and drinking restrictions Follow instructions from your health care provider about eating and drinking restrictions, which may include:  8 hours before the procedure - stop eating heavy meals or foods such as meat, fried foods, or fatty foods.  6 hours before the procedure - stop eating light meals or foods, such as toast or cereal.  6 hours before the procedure - stop drinking milk or drinks that contain milk.  2 hours before the procedure - stop drinking clear liquids.  General instructions  Ask your health care provider about: ? Changing or stopping your regular medicines. This is especially important if you are taking diabetes medicines or blood thinners. ? Taking medicines such as aspirin and ibuprofen. These medicines can thin your blood. Do not take these medicines before your procedure if your health care provider instructs you not to. ?  Taking a sip of water with any approved medicines on the morning of the procedure.  Plan to have someone take you home after the procedure. What happens during the procedure?  To reduce your risk of infection: ? Your health care team will wash or sanitize their hands. ? The skin around the area of the  chest will be washed with soap. ? Hair may be removed from the surgical area.  An IV tube will be inserted into one your veins to give you medicine and fluids.  You will be given one or more of the following: ? A medicine to help you relax (sedative). ? A medicine to numb the area where the pacemaker is located (local anesthetic).  You may be given antibiotic medicine to prevent infection.  Your health care provider will make an incision to reopen the pocket holding the pacemaker.  The old pacemaker will be disconnected from the leads.  The leads will be tested.  If needed, the leads will be replaced. If the leads are functioning properly, the new pacemaker will be connected to the existing leads.  A heart monitor and the pacemaker programmer will be used to make sure that the newly implanted pacemaker is working properly.  The incision site will be closed. A bandage (dressing) will be placed over the pacemaker site. The procedure may vary among health care providers and hospitals. What happens after the procedure?  Your blood pressure, heart rate, breathing rate, and blood oxygen level will be monitored until your health care team is satisfied that your pacemaker is working properly.  Your health care provider will tell you when your pacemaker will need to be tested again, or when to return to the office for removal of dressing and stitches.  Do not drive for 24 hours if you were given a sedative.  The dressing will be removed 24-48 hours after the procedure, or as told by your health care provider. Summary  A pacemaker battery usually lasts 5-15 years (6-7 years on average).  When the battery is low, your pacemaker battery and generator will be completely replaced.  Risks of this procedure include bleeding, bruising, infection, damage to other structures, pulling apart of the skin at the incision site, and allergic reactions to medicines or anesthetics.  Most often, this  procedure is simpler than the first surgery because the wires (leads) that connect the generator to the heart are already in place. This information is not intended to replace advice given to you by your health care provider. Make sure you discuss any questions you have with your health care provider. Document Released: 08/08/2006 Document Revised: 04/03/2016 Document Reviewed: 04/03/2016 Elsevier Interactive Patient Education  [address].     Signed, Sherri Fair, MD  10/01/2017 4:45 PM    [facility] [address] [place], [place], [place]  [address] Phone: [phone]; Fax: [phone]

## 2017-10-01 NOTE — Progress Notes (Signed)
Patient ID: [name], female   DOB: 01/18/1928, 84 y.o.   MRN: [id number]    Cardiology Office Note    Date:  10/01/2017   ID:  [name], DOB 11/27/1927, MRN [id number]  PCP:  Knapp, Eve, MD  Cardiologist:   Larico Dimock, MD   Chief Complaint  Patient presents with  . Follow-up  CHF, pacemaker at ERI  History of Present Illness:  [name] is a 84 y.o. female with sinus node dysfunction which has progressed to atrial standstill. She has normal atrioventricular conduction. She is pacemaker dependent (atrial). She initially had a pacemaker implanted in 1994 and has subsequently had 2 generator change out procedures, most recently in 2007. She still has the original unipolar atrial and ventricular leads from 1994.   Shortness of breath is a chronic complaint, but is been worse over the last 2 weeks.  Interrogation of her pacemaker shows that it reached ERI on May 9, roughly 2 weeks ago.  She now has 100% ventricular pacing.  It is likely that loss of AV synchrony is contributing to her symptoms.  She does not like taking higher doses of diuretics and has curtailed her furosemide to 40 mg once daily.  Denies angina syncope palpitations orthopnea or PND, but has mild symmetrical ankle swelling.  Device interrogation is limited today due to the ERI switch.  She is pacing 100% in the ventricle and there is no discernible atrial activity on her surface ECG.  Therefore unable to tell whether there is still any issues with atrial over sensing due to "atrial lead noise".  Prior to the mode switch she had 96% atrial pacing and only 25% ventricular pacing. R-wave sensing at 2 mV is mediocre, but stable.   Past Medical History:  Diagnosis Date  . Chest wall pain 2003   atypical, now resolved  . CKD (chronic kidney disease), stage III (HCC)    Cr 1.27, GFR 40 (07/2011)  . Diverticulitis    with DT flare in 6/09  . Gout    R hand  . Hypertension   . Hypertriglyceridemia   .  Macular degeneration    Dr. Koop  . Pacemaker    sees Dr. Croituro ([facility]) annually. Pacemaker replaced 2007 ([facility])  . Sick sinus syndrome (HCC) with bradycardia  . Tobacco use disorder    quit 01/2013--restarted in 2017  . Urge incontinence     Past Surgical History:  Procedure Laterality Date  . ABDOMINAL HYSTERECTOMY  1978   partial  . APPENDECTOMY  1952  . CARDIAC CATHETERIZATION  2000  . CHOLECYSTECTOMY  1998   Dr. Gerkin  . COLON SURGERY  11/2009   vaginal-colon fistula repair (Drs. Gerkin/Newman)  . CYSTOSCOPY  2002   Dr. Humphries  . EYE SURGERY  11/2004   cataract surgery b/l - Dr.Bergen  . PACEMAKER INSERTION  1994, 2000, 2007  . TUBAL LIGATION  1960    Current Medications: Outpatient Medications Prior to Visit  Medication Sig Dispense Refill  . acetaminophen (TYLENOL) 500 MG tablet Take 1,000 mg by mouth every 6 (six) hours as needed.    . aspirin 81 MG tablet Take 81 mg by mouth daily.    . calcium carbonate (OS-CAL) 600 MG TABS Take 630 mg by mouth daily.     . furosemide (LASIX) 40 MG tablet Take 1.5 tablets (60 mg total) by mouth daily. 135 tablet 3  . metoprolol tartrate (LOPRESSOR) 25 MG tablet Take 1 tablet (25 mg total)   by mouth 2 (two) times daily. 180 tablet 1  . Multiple Vitamins-Minerals (MULTIVITAMIN WITH MINERALS) tablet Take 1 tablet by mouth daily.    . Multiple Vitamins-Minerals (OCUVITE PRESERVISION PO) Take 1 tablet by mouth daily.    . Omega-3 Fatty Acids (FISH OIL) 1000 MG CAPS Take 3 capsules by mouth daily.    . polycarbophil (FIBERCON) 625 MG tablet Take 1,875 mg by mouth daily.    . potassium chloride (K-DUR) 10 MEQ tablet Take 1 tablet (10 mEq total) by mouth daily. 90 tablet 3   No facility-administered medications prior to visit.      Allergies:   Ciprofloxacin; Flagyl [metronidazole]; and Morphine and related   Social History   Socioeconomic History  . Marital status: Divorced    Spouse name: Not on file  . Number of children:  4  . Years of education: Not on file  . Highest education level: Not on file  Occupational History  . Occupation: retired nursing assistant  Social Needs  . Financial resource strain: Not on file  . Food insecurity:    Worry: Not on file    Inability: Not on file  . Transportation needs:    Medical: Not on file    Non-medical: Not on file  Tobacco Use  . Smoking status: Current Some Day Smoker    Packs/day: 0.50    Years: 40.00    Pack years: 20.00    Last attempt to quit: 01/12/2013    Years since quitting: 4.7  . Smokeless tobacco: Never Used  . Tobacco comment: smokes 1/2 PPD, quite 01/2013, restarted in 2017; smokes more when her daughter who smokes visits  Substance and Sexual Activity  . Alcohol use: No    Alcohol/week: 0.0 oz  . Drug use: No  . Sexual activity: Not Currently  Lifestyle  . Physical activity:    Days per week: Not on file    Minutes per session: Not on file  . Stress: Not on file  Relationships  . Social connections:    Talks on phone: Not on file    Gets together: Not on file    Attends religious service: Not on file    Active member of club or organization: Not on file    Attends meetings of clubs or organizations: Not on file    Relationship status: Not on file  Other Topics Concern  . Not on file  Social History Narrative   Lives alone, no pets (cat died).  [place] and [place], Deborah (RN at neuro ICU) lives in [place], 1 ([name]) lives at [place].   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     Family History:  The patient's family history includes Alcohol abuse in her brother; Alzheimer's disease (age of onset: 70) in her brother; Cancer in her other and sister; Crohn's disease in her daughter; Diabetes in her brother and mother; Heart disease in her brother, father, and mother; Heart disease (age of onset: 62) in her other; Hyperlipidemia in her father and mother; Hypertension in her  father and mother; Stroke (age of onset: 62) in her other; Ulcerative colitis in her daughter.   ROS:   Please see the history of present illness.    ROS All other systems reviewed and are negative.   PHYSICAL EXAM:   VS:  BP 124/73   Pulse 64   Ht 5' 4" (1.626 m)   Wt 139 lb 9.6   oz (63.3 kg)   BMI 23.96 kg/m      General: Alert, oriented x3, no distress, appears uncomfortable Head: no evidence of trauma, PERRL, EOMI, no exophtalmos or lid lag, no myxedema, no xanthelasma; normal ears, nose and oropharynx Neck: 1-8 cm elevation in jugular venous pulsations and no hepatojugular reflux; brisk carotid pulses without delay and no carotid bruits Chest: clear to auscultation, no signs of consolidation by percussion or palpation, normal fremitus, symmetrical and full respiratory excursions Cardiovascular: normal position and quality of the apical impulse, regular rhythm, normal first and paradoxically split second heart sounds, no murmurs, rubs or gallops.  Healthy right subclavian pacemaker site, albeit with some fat atrophy. Abdomen: no tenderness or distention, no masses by palpation, no abnormal pulsatility or arterial bruits, normal bowel sounds, no hepatosplenomegaly Extremities: no clubbing, cyanosis, there is 1+ symmetrical ankle and pretibial edema; 2+ radial, ulnar and brachial pulses bilaterally; 2+ right femoral, posterior tibial and dorsalis pedis pulses; 2+ left femoral, posterior tibial and dorsalis pedis pulses; no subclavian or femoral bruits Neurological: grossly nonfocal, very hard of hearing Psych: Normal mood and affect    Wt Readings from Last 3 Encounters:  10/01/17 139 lb 9.6 oz (63.3 kg)  09/26/17 141 lb 6.4 oz (64.1 kg)  08/02/17 139 lb 12.8 oz (63.4 kg)      Studies/Labs Reviewed:   EKG:  EKG is ordered today.  The ekg ordered today shows 100% ventricular paced rhythm without any discernible atrial activity  Recent Labs: 02/25/2017: ALT 6; Hemoglobin 13.2;  Platelets 225 03/27/2017: TSH 2.30 09/26/2017: BUN 26; Creatinine, Ser 1.48; Potassium 4.5; Sodium 143   Lipid Panel    Component Value Date/Time   CHOL 182 03/27/2017 1023   TRIG 207 (H) 03/27/2017 1023   HDL 53 03/27/2017 1023   CHOLHDL 3.4 03/27/2017 1023   VLDL 47 (H) 03/01/2016 1436   LDLCALC 99 03/27/2017 1023    ASSESSMENT:    1. Chronic diastolic heart failure (HCC)   2. Sick sinus syndrome (HCC)   3. Pacemaker battery depletion   4. Essential hypertension   5. PAT (paroxysmal atrial tachycardia) (HCC)   6. Mixed hyperlipidemia      PLAN:  In order of problems listed above:  1. CHF: She is still hypervolemic and has cut back her diuretic.  I think she should be on the higher dose of diuretic but as long as her shortness of breath is tolerable I did not force the issue today.  Continue the potassium supplement.  Advised her to increase the diuretic dose if her breathing becomes any more difficult than it is today and seek urgent medical attention if necessary.  He is more prone to heart failure decompensation now that she has lost her "atrial kick". 2. SSS: Had virtually 100% atrial pacing before the mode switch. 3. PM battery depletion: Although lead parameters were not optimal, they are acceptable.  We will recheck again in the procedure room, but I am highly reluctant to implant new leads considering her multiple previous procedures and advanced age. This procedure has been fully reviewed with the patient and written informed consent has been obtained.  She is "atrially dependent" and has had multiple procedures performed. Will recommend implantation of an antibiotic TyRx pouch.  At this point, the first available procedure date is June 18, but will try to bring her in sooner if scheduling allows. Has some degree of pacemaker syndrome.  4. HTN: Good control 5. PAT: Unable to evaluate the overall burden   since her last device check, but beta-blocker seems to be working  well. 6. HLP: LDL less than 100 and acceptable, since she does not have coronary vascular disease.  I would not recommend therapy for her mild hypertriglyceridemia.    Medication Adjustments/Labs and Tests Ordered: Current medicines are reviewed at length with the patient today.  Concerns regarding medicines are outlined above.  Medication changes, Labs and Tests ordered today are listed in the Patient Instructions below. Patient Instructions  Pacemaker Battery Change A pacemaker battery usually lasts 5-15 years (6-7 years on average). A few times a year, you will be asked to visit your health care provider to have a full evaluation of your pacemaker. When the battery is low, your pacemaker battery and generator will be completely replaced. Most often, this procedure is simpler than the first surgery because the wires (leads) that connect the generator to the heart are already in place. There are many things that affect how long a pacemaker battery will last, including:  The age of the pacemaker.  The number of leads you have(1, 2, or 3).  The pacemaker workload. If the pacemaker is helping the heart more often, the battery will not last as long.  Power (voltage) settings.  Tell a health care provider about:  Any allergies you have.  All medicines you are taking, including vitamins, herbs, eye drops, creams, and over-the-counter medicines.  Any problems you or family members have had with anesthetic medicines.  Any blood disorders you have.  Any surgeries you have had, especially the surgeries you have had since your last pacemaker was placed.  Any medical conditions you have.  Whether you are pregnant or may be pregnant.  Any symptoms of heart problems, such as chest pain, trouble breathing, palpitations, light-headedness, or feelings of an abnormal or irregular heartbeat.  Smoking habits. This can affect your reaction to anesthesia. What are the risks? Generally, this is a  safe procedure. However, problems may occur, including:  Bleeding.  Bruising of the skin around where the surgical cut (incision) was made.  Pulling apart of the skin at the incision site.  Infection.  Nerve damage.  Injury to other organs, such as the lungs.  Allergic reaction to anesthetics or other medicines used during the procedure.  People with diabetes may have a temporary increase in blood sugar (glucose) after any surgical procedure.  What happens before the procedure? Staying hydrated Follow instructions from your health care provider about hydration, which may include:  Up to 2 hours before the procedure - you may continue to drink clear liquids, such as water, clear fruit juice, black coffee, and plain tea.  Eating and drinking restrictions Follow instructions from your health care provider about eating and drinking restrictions, which may include:  8 hours before the procedure - stop eating heavy meals or foods such as meat, fried foods, or fatty foods.  6 hours before the procedure - stop eating light meals or foods, such as toast or cereal.  6 hours before the procedure - stop drinking milk or drinks that contain milk.  2 hours before the procedure - stop drinking clear liquids.  General instructions  Ask your health care provider about: ? Changing or stopping your regular medicines. This is especially important if you are taking diabetes medicines or blood thinners. ? Taking medicines such as aspirin and ibuprofen. These medicines can thin your blood. Do not take these medicines before your procedure if your health care provider instructs you not to. ?   Taking a sip of water with any approved medicines on the morning of the procedure.  Plan to have someone take you home after the procedure. What happens during the procedure?  To reduce your risk of infection: ? Your health care team will wash or sanitize their hands. ? The skin around the area of the  chest will be washed with soap. ? Hair may be removed from the surgical area.  An IV tube will be inserted into one your veins to give you medicine and fluids.  You will be given one or more of the following: ? A medicine to help you relax (sedative). ? A medicine to numb the area where the pacemaker is located (local anesthetic).  You may be given antibiotic medicine to prevent infection.  Your health care provider will make an incision to reopen the pocket holding the pacemaker.  The old pacemaker will be disconnected from the leads.  The leads will be tested.  If needed, the leads will be replaced. If the leads are functioning properly, the new pacemaker will be connected to the existing leads.  A heart monitor and the pacemaker programmer will be used to make sure that the newly implanted pacemaker is working properly.  The incision site will be closed. A bandage (dressing) will be placed over the pacemaker site. The procedure may vary among health care providers and hospitals. What happens after the procedure?  Your blood pressure, heart rate, breathing rate, and blood oxygen level will be monitored until your health care team is satisfied that your pacemaker is working properly.  Your health care provider will tell you when your pacemaker will need to be tested again, or when to return to the office for removal of dressing and stitches.  Do not drive for 24 hours if you were given a sedative.  The dressing will be removed 24-48 hours after the procedure, or as told by your health care provider. Summary  A pacemaker battery usually lasts 5-15 years (6-7 years on average).  When the battery is low, your pacemaker battery and generator will be completely replaced.  Risks of this procedure include bleeding, bruising, infection, damage to other structures, pulling apart of the skin at the incision site, and allergic reactions to medicines or anesthetics.  Most often, this  procedure is simpler than the first surgery because the wires (leads) that connect the generator to the heart are already in place. This information is not intended to replace advice given to you by your health care provider. Make sure you discuss any questions you have with your health care provider. Document Released: 08/08/2006 Document Revised: 04/03/2016 Document Reviewed: 04/03/2016 Elsevier Interactive Patient Education  2017 [employer].     Signed, Virginie Josten, MD  10/01/2017 4:45 PM    [facility] [address], [place], [place]  [address] Phone: [phone]; Fax: [phone]    

## 2017-10-01 NOTE — Patient Instructions (Addendum)
Pacemaker Battery Change °A pacemaker battery usually lasts 5-15 years (6-7 years on average). A few times a year, you will be asked to visit your health care provider to have a full evaluation of your pacemaker. When the battery is low, your pacemaker battery and generator will be completely replaced. Most often, this procedure is simpler than the first surgery because the wires (leads) that connect the generator to the heart are already in place. °There are many things that affect how long a pacemaker battery will last, including: °· The age of the pacemaker. °· The number of leads you have(1, 2, or 3). °· The pacemaker workload. If the pacemaker is helping the heart more often, the battery will not last as long. °· Power (voltage) settings. ° °Tell a health care provider about: °· Any allergies you have. °· All medicines you are taking, including vitamins, herbs, eye drops, creams, and over-the-counter medicines. °· Any problems you or family members have had with anesthetic medicines. °· Any blood disorders you have. °· Any surgeries you have had, especially the surgeries you have had since your last pacemaker was placed. °· Any medical conditions you have. °· Whether you are pregnant or may be pregnant. °· Any symptoms of heart problems, such as chest pain, trouble breathing, palpitations, light-headedness, or feelings of an abnormal or irregular heartbeat. °· Smoking habits. This can affect your reaction to anesthesia. °What are the risks? °Generally, this is a safe procedure. However, problems may occur, including: °· Bleeding. °· Bruising of the skin around where the surgical cut (incision) was made. °· Pulling apart of the skin at the incision site. °· Infection. °· Nerve damage. °· Injury to other organs, such as the lungs. °· Allergic reaction to anesthetics or other medicines used during the procedure. °· People with diabetes may have a temporary increase in blood sugar (glucose) after any surgical  procedure. ° °What happens before the procedure? °Staying hydrated °Follow instructions from your health care provider about hydration, which may include: °· Up to 2 hours before the procedure - you may continue to drink clear liquids, such as water, clear fruit juice, black coffee, and plain tea. ° °Eating and drinking restrictions °Follow instructions from your health care provider about eating and drinking restrictions, which may include: °· 8 hours before the procedure - stop eating heavy meals or foods such as meat, fried foods, or fatty foods. °· 6 hours before the procedure - stop eating light meals or foods, such as toast or cereal. °· 6 hours before the procedure - stop drinking milk or drinks that contain milk. °· 2 hours before the procedure - stop drinking clear liquids. ° °General instructions °· Ask your health care provider about: °? Changing or stopping your regular medicines. This is especially important if you are taking diabetes medicines or blood thinners. °? Taking medicines such as aspirin and ibuprofen. These medicines can thin your blood. Do not take these medicines before your procedure if your health care provider instructs you not to. °? Taking a sip of water with any approved medicines on the morning of the procedure. °· Plan to have someone take you home after the procedure. °What happens during the procedure? °· To reduce your risk of infection: °? Your health care team will wash or sanitize their hands. °? The skin around the area of the chest will be washed with soap. °? Hair may be removed from the surgical area. °· An IV tube will be inserted into one your   veins to give you medicine and fluids. °· You will be given one or more of the following: °? A medicine to help you relax (sedative). °? A medicine to numb the area where the pacemaker is located (local anesthetic). °· You may be given antibiotic medicine to prevent infection. °· Your health care provider will make an incision to  reopen the pocket holding the pacemaker. °· The old pacemaker will be disconnected from the leads. °· The leads will be tested. °· If needed, the leads will be replaced. If the leads are functioning properly, the new pacemaker will be connected to the existing leads. °· A heart monitor and the pacemaker programmer will be used to make sure that the newly implanted pacemaker is working properly. °· The incision site will be closed. A bandage (dressing) will be placed over the pacemaker site. °The procedure may vary among health care providers and hospitals. °What happens after the procedure? °· Your blood pressure, heart rate, breathing rate, and blood oxygen level will be monitored until your health care team is satisfied that your pacemaker is working properly. °· Your health care provider will tell you when your pacemaker will need to be tested again, or when to return to the office for removal of dressing and stitches. °· Do not drive for 24 hours if you were given a sedative. °· The dressing will be removed 24-48 hours after the procedure, or as told by your health care provider. °Summary °· A pacemaker battery usually lasts 5-15 years (6-7 years on average). °· When the battery is low, your pacemaker battery and generator will be completely replaced. °· Risks of this procedure include bleeding, bruising, infection, damage to other structures, pulling apart of the skin at the incision site, and allergic reactions to medicines or anesthetics. °· Most often, this procedure is simpler than the first surgery because the wires (leads) that connect the generator to the heart are already in place. °This information is not intended to replace advice given to you by your health care provider. Make sure you discuss any questions you have with your health care provider. °Document Released: 08/08/2006 Document Revised: 04/03/2016 Document Reviewed: 04/03/2016 °Elsevier Interactive Patient Education © 2017 Elsevier Inc. ° °

## 2017-10-02 ENCOUNTER — Encounter: Payer: Self-pay | Admitting: Cardiovascular Disease

## 2017-10-02 ENCOUNTER — Telehealth: Payer: Self-pay

## 2017-10-02 DIAGNOSIS — I10 Essential (primary) hypertension: Secondary | ICD-10-CM

## 2017-10-02 MED ORDER — FUROSEMIDE 40 MG PO TABS: 60.0000 mg | ORAL_TABLET | Freq: Every day | ORAL | 3 refills | Status: DC

## 2017-10-02 NOTE — Telephone Encounter (Signed)
Lasix refilled per email from 10/02/17.  PPM gen changed scheduled for 10/17/17 at 1:30 p with Dr Royann Shivers.

## 2017-10-09 NOTE — Addendum Note (Signed)
Addended by: Lorain Childes A on: 10/09/2017 07:31 AM   Modules accepted: Orders

## 2017-10-10 ENCOUNTER — Other Ambulatory Visit: Payer: Self-pay | Admitting: Family Medicine

## 2017-10-10 DIAGNOSIS — I10 Essential (primary) hypertension: Secondary | ICD-10-CM

## 2017-10-13 DIAGNOSIS — Z45010 Encounter for checking and testing of cardiac pacemaker pulse generator [battery]: Secondary | ICD-10-CM

## 2017-10-14 ENCOUNTER — Encounter: Payer: Self-pay | Admitting: Cardiovascular Disease

## 2017-10-17 ENCOUNTER — Encounter: Payer: Self-pay | Admitting: Cardiovascular Disease

## 2017-10-17 ENCOUNTER — Ambulatory Visit (HOSPITAL_COMMUNITY)
Admission: RE | Admit: 2017-10-17 | Discharge: 2017-10-17 | Disposition: A | Discharge status: Home / Self Care | Payer: Medicare HMO | Source: Ambulatory Visit | Attending: Cardiovascular Disease | Admitting: Cardiovascular Disease

## 2017-10-17 ENCOUNTER — Encounter (HOSPITAL_COMMUNITY)
Admission: RE | Disposition: A | Discharge status: Home / Self Care | Payer: Self-pay | Source: Ambulatory Visit | Attending: Cardiovascular Disease

## 2017-10-17 ENCOUNTER — Other Ambulatory Visit: Payer: Self-pay | Admitting: Cardiovascular Disease

## 2017-10-17 DIAGNOSIS — Z885 Allergy status to narcotic agent status: Secondary | ICD-10-CM | POA: Diagnosis not present

## 2017-10-17 DIAGNOSIS — I471 Supraventricular tachycardia: Secondary | ICD-10-CM | POA: Diagnosis not present

## 2017-10-17 DIAGNOSIS — Z45010 Encounter for checking and testing of cardiac pacemaker pulse generator [battery]: Secondary | ICD-10-CM

## 2017-10-17 DIAGNOSIS — F17210 Nicotine dependence, cigarettes, uncomplicated: Secondary | ICD-10-CM | POA: Diagnosis not present

## 2017-10-17 DIAGNOSIS — N183 Chronic kidney disease, stage 3 (moderate): Secondary | ICD-10-CM | POA: Insufficient documentation

## 2017-10-17 DIAGNOSIS — Z9889 Other specified postprocedural states: Secondary | ICD-10-CM | POA: Insufficient documentation

## 2017-10-17 DIAGNOSIS — E782 Mixed hyperlipidemia: Secondary | ICD-10-CM | POA: Diagnosis not present

## 2017-10-17 DIAGNOSIS — Z9049 Acquired absence of other specified parts of digestive tract: Secondary | ICD-10-CM | POA: Insufficient documentation

## 2017-10-17 DIAGNOSIS — I495 Sick sinus syndrome: Secondary | ICD-10-CM | POA: Diagnosis not present

## 2017-10-17 DIAGNOSIS — Z7982 Long term (current) use of aspirin: Secondary | ICD-10-CM | POA: Diagnosis not present

## 2017-10-17 DIAGNOSIS — I130 Hypertensive heart and chronic kidney disease with heart failure and stage 1 through stage 4 chronic kidney disease, or unspecified chronic kidney disease: Secondary | ICD-10-CM | POA: Insufficient documentation

## 2017-10-17 DIAGNOSIS — Z881 Allergy status to other antibiotic agents status: Secondary | ICD-10-CM | POA: Diagnosis not present

## 2017-10-17 DIAGNOSIS — Z8249 Family history of ischemic heart disease and other diseases of the circulatory system: Secondary | ICD-10-CM | POA: Insufficient documentation

## 2017-10-17 DIAGNOSIS — R69 Illness, unspecified: Secondary | ICD-10-CM | POA: Diagnosis not present

## 2017-10-17 DIAGNOSIS — I5032 Chronic diastolic (congestive) heart failure: Secondary | ICD-10-CM | POA: Diagnosis not present

## 2017-10-17 DIAGNOSIS — Z79899 Other long term (current) drug therapy: Secondary | ICD-10-CM | POA: Diagnosis not present

## 2017-10-17 DIAGNOSIS — H3530 Unspecified macular degeneration: Secondary | ICD-10-CM | POA: Diagnosis not present

## 2017-10-17 DIAGNOSIS — Z90711 Acquired absence of uterus with remaining cervical stump: Secondary | ICD-10-CM | POA: Insufficient documentation

## 2017-10-17 DIAGNOSIS — Z823 Family history of stroke: Secondary | ICD-10-CM | POA: Insufficient documentation

## 2017-10-17 DIAGNOSIS — Z950 Presence of cardiac pacemaker: Secondary | ICD-10-CM | POA: Diagnosis present

## 2017-10-17 HISTORY — PX: PPM GENERATOR CHANGEOUT: EP1233

## 2017-10-17 LAB — SURGICAL PCR SCREEN
MRSA, PCR: NEGATIVE
Staphylococcus aureus: NEGATIVE

## 2017-10-17 LAB — CBC
HCT: 44.4 % (ref 36.0–46.0)
Hemoglobin: 14 g/dL (ref 12.0–15.0)
MCH: 31.3 pg (ref 26.0–34.0)
MCHC: 31.5 g/dL (ref 30.0–36.0)
MCV: 99.1 fL (ref 78.0–100.0)
PLATELETS: 190 10*3/uL (ref 150–400)
RBC: 4.48 MIL/uL (ref 3.87–5.11)
RDW: 13.4 % (ref 11.5–15.5)
WBC: 8.5 10*3/uL (ref 4.0–10.5)

## 2017-10-17 LAB — BASIC METABOLIC PANEL
Anion gap: 8 (ref 5–15)
BUN: 21 mg/dL — AB (ref 6–20)
CO2: 31 mmol/L (ref 22–32)
CREATININE: 1.31 mg/dL — AB (ref 0.44–1.00)
Calcium: 9.5 mg/dL (ref 8.9–10.3)
Chloride: 104 mmol/L (ref 101–111)
GFR calc Af Amer: 41 mL/min — ABNORMAL LOW (ref >60)
GFR, EST NON AFRICAN AMERICAN: 35 mL/min — AB (ref >60)
GLUCOSE: 96 mg/dL (ref 65–99)
POTASSIUM: 4.6 mmol/L (ref 3.5–5.1)
Sodium: 143 mmol/L (ref 135–145)

## 2017-10-17 LAB — PROTIME-INR
INR: 1.06
Prothrombin Time: 13.7 seconds (ref 11.4–15.2)

## 2017-10-17 SURGERY — PPM GENERATOR CHANGEOUT

## 2017-10-17 MED ORDER — MUPIROCIN 2 % EX OINT
TOPICAL_OINTMENT | CUTANEOUS | Status: AC
  Administered 2017-10-17: 11:00:00
  Filled 2017-10-17: qty 22

## 2017-10-17 MED ORDER — FENTANYL CITRATE (PF) 100 MCG/2ML IJ SOLN
INTRAMUSCULAR | Status: DC | PRN
  Administered 2017-10-17 (×2): 25 ug via INTRAVENOUS

## 2017-10-17 MED ORDER — SODIUM CHLORIDE 0.9 % IV SOLN
INTRAVENOUS | Status: AC
  Filled 2017-10-17: qty 2

## 2017-10-17 MED ORDER — CEFAZOLIN SODIUM-DEXTROSE 2-4 GM/100ML-% IV SOLN
INTRAVENOUS | Status: AC
  Filled 2017-10-17: qty 100

## 2017-10-17 MED ORDER — MIDAZOLAM HCL 5 MG/5ML IJ SOLN
INTRAMUSCULAR | Status: AC
  Filled 2017-10-17: qty 5

## 2017-10-17 MED ORDER — FENTANYL CITRATE (PF) 100 MCG/2ML IJ SOLN
INTRAMUSCULAR | Status: AC
  Filled 2017-10-17: qty 2

## 2017-10-17 MED ORDER — SODIUM CHLORIDE 0.9 % IV SOLN
80.0000 mg | INTRAVENOUS | Status: AC
  Administered 2017-10-17: 80 mg

## 2017-10-17 MED ORDER — LIDOCAINE HCL (PF) 1 % IJ SOLN
INTRAMUSCULAR | Status: DC | PRN
  Administered 2017-10-17: 60 mL

## 2017-10-17 MED ORDER — ACETAMINOPHEN 325 MG PO TABS: 325.0000 mg | ORAL_TABLET | ORAL | Status: DC | PRN

## 2017-10-17 MED ORDER — CEFAZOLIN SODIUM-DEXTROSE 2-4 GM/100ML-% IV SOLN
2.0000 g | INTRAVENOUS | Status: AC
  Administered 2017-10-17: 2 g via INTRAVENOUS
  Filled 2017-10-17: qty 100

## 2017-10-17 MED ORDER — SODIUM CHLORIDE 0.9 % IV SOLN
INTRAVENOUS | Status: DC
  Administered 2017-10-17: 11:00:00 via INTRAVENOUS

## 2017-10-17 MED ORDER — MIDAZOLAM HCL 5 MG/5ML IJ SOLN
INTRAMUSCULAR | Status: DC | PRN
  Administered 2017-10-17 (×2): 1 mg via INTRAVENOUS

## 2017-10-17 MED ORDER — LIDOCAINE HCL (PF) 1 % IJ SOLN
INTRAMUSCULAR | Status: AC
  Filled 2017-10-17: qty 60

## 2017-10-17 MED ORDER — CHLORHEXIDINE GLUCONATE 4 % EX LIQD
60.0000 mL | Freq: Once | CUTANEOUS | Status: DC
  Filled 2017-10-17: qty 60

## 2017-10-17 MED ORDER — ONDANSETRON HCL 4 MG/2ML IJ SOLN: 4.0000 mg | Freq: Four times a day (QID) | INTRAMUSCULAR | Status: DC | PRN

## 2017-10-17 MED ORDER — MUPIROCIN 2 % EX OINT
1.0000 "application " | TOPICAL_OINTMENT | Freq: Once | CUTANEOUS | Status: DC
  Filled 2017-10-17: qty 22

## 2017-10-17 SURGICAL SUPPLY — 7 items
CABLE SURGICAL S-101-97-12 (CABLE) ×2 IMPLANT
IPG PACE AZUR XT DR MRI W1DR01 (Pacemaker) IMPLANT
PACE AZURE XT DR MRI W1DR01 (Pacemaker) ×2 IMPLANT
PAD DEFIB LIFELINK (PAD) ×1 IMPLANT
POUCH AIGIS-R ANTIBACT ICD (Mesh General) ×2 IMPLANT
POUCH AIGIS-R ANTIBACT ICD LRG (Mesh General) IMPLANT
TRAY PACEMAKER INSERTION (PACKS) ×1 IMPLANT

## 2017-10-17 NOTE — Discharge Instructions (Signed)
Supplemental Discharge Instructions for  Pacemaker/Defibrillator Patients  Activity Do not raise your left/right arm above shoulder level or extend it backward beyond shoulder level for 2 weeks.    NO DRIVING is preferable for 2 weeks; If absolutely necessary, drive only short, familiar routes.  WOUND CARE - Keep the wound area clean and dry.  Remove the dressing the day after you return home (usually 48 hours after the procedure). - DO NOT SUBMERGE UNDER WATER UNTIL FULLY HEALED (no tub baths, hot tubs, swimming pools, etc.).  - You  may shower or take a sponge bath after the dressing is removed. DO NOT SOAK the area and do not allow the shower to directly spray on the site. - If you have staples, these will be removed in the office in 7-14 days. - If you have tape/steri-strips on your wound, these will fall off; do not pull them off prematurely.   - No bandage is needed on the site.  DO  NOT apply any creams, oils, or ointments to the wound area. - If you notice any drainage or discharge from the wound, any swelling, excessive redness or bruising at the site, or if you develop a fever > 101? F after you are discharged home, call the office at once.  Special Instructions - You are still able to use cellular telephones.  Avoid carrying your cellular phone near your device. - When traveling through airports, show security personnel your identification card to avoid being screened in the metal detectors.  - Avoid arc welding equipment, MRI testing (magnetic resonance imaging), TENS units (transcutaneous nerve stimulators).  Call the office for questions about other devices. - Avoid electrical appliances that are in poor condition or are not properly grounded. - Microwave ovens are safe to be near or to operate.

## 2017-10-17 NOTE — Op Note (Signed)
Procedure report  Procedure performed:  1. Dual chamber pacemaker generator changeout  2. Light sedation  Reason for procedure:  1. Device generator at elective replacement interval  2. SSS/Sinus node arrest ("atrially dependent") Procedure performed by:  Thurmon FairMihai Steaven Wholey, MD  Complications:  None  Estimated blood loss:  <5 mL  Medications administered during procedure:  Ancef 2 g intravenously, lidocaine 1% 30 mL locally, fentanyl 50 mcg intravenously, Versed 2 mg intravenously  During this procedure the patient is administered a total of Versed 2 mg and Fentanyl 50 mcg to achieve and maintain moderate conscious sedation.  The patient's heart rate, blood pressure, and oxygen saturation are monitored continuously during the procedure. The period of conscious sedation is 28 minutes, of which I was present face-to-face 100% of this time.  Device details:   New Generator Medtronic Azure XT DR model number [id number], serial number [id number] Right atrial lead (chronic) [employer], model number 433-03, serial [id number] (implanted 12/26/1992) Right ventricular lead (chronic)  [employer], model number 431-07, serial number [id number] (implanted 12/26/1992)  Explanted generator Medtronic Adapta  Procedure details:  After the risks and benefits of the procedure were discussed the patient provided informed consent. She was brought to the cardiac catheter lab in the fasting state. The patient was prepped and draped in usual sterile fashion. Local anesthesia with 1% lidocaine was administered to to the left infraclavicular area. A 5-6cm horizontal incision was made parallel with and 2-3 cm caudal to the right clavicle, in the area of an old scar. An older scar was seen closer to the rightclavicle. Using sharp and blunt dissection the prepectoral pocket was opened carefully to avoid injury to the loops of chronic leads. Electrocautery was not used (unipolar leads). Extensive dissection was not necessary.  The device was not fully explanted. The pocket was carefully inspected for hemostasis and flushed with copious amounts of antibiotic solution.  The leads were disconnected sequentially from the old generator to avoid loss of pacing and connected to the new device. Telemetry testing of the lead parameters showed excellent values. The new generator was connected to the chronic leads, with appropriate pacing noted.   The entire system was then covered with a TyRx pouch inserted in the pocket with care been taking that the leads and device assumed a comfortable position without pressure on the incision. Great care was taken that the leads be located deep to the generator. The pocket was then closed in layers using 2 layers of 2-0 Vicryl and cutaneous staples after which a sterile dressing was applied.   At the end of the procedure the following lead parameters were encountered:   Right atrial lead sensed P waves none mV, impedance 285 ohms, threshold 0.75 at 0.4 ms pulse width.  Right ventricular lead sensed R waves  2.9 mV, impedance 323 ohms, threshold 1.5 at 0.4 ms pulse width.  Thurmon FairMihai Cameren Earnest, MD, [facility] [phone] office [phone] pager

## 2017-10-17 NOTE — Interval H&P Note (Signed)
History and Physical Interval Note:  10/17/2017 1:22 PM  [name]  has presented today for surgery, with the diagnosis of ERI  The various methods of treatment have been discussed with the patient and family. After consideration of risks, benefits and other options for treatment, the patient has consented to  Procedure(s): PPM GENERATOR CHANGEOUT (N/A) as a surgical intervention .  The patient's history has been reviewed, patient examined, no change in status, stable for surgery.  I have reviewed the patient's chart and labs.  Questions were answered to the patient's satisfaction.     [name]

## 2017-10-18 ENCOUNTER — Encounter (HOSPITAL_COMMUNITY): Payer: Self-pay | Admitting: Cardiovascular Disease

## 2017-10-31 ENCOUNTER — Encounter: Payer: Self-pay | Admitting: Cardiovascular Disease

## 2017-10-31 ENCOUNTER — Ambulatory Visit (INDEPENDENT_AMBULATORY_CARE_PROVIDER_SITE_OTHER): Payer: Medicare HMO | Admitting: *Deleted

## 2017-10-31 DIAGNOSIS — Z950 Presence of cardiac pacemaker: Secondary | ICD-10-CM | POA: Diagnosis not present

## 2017-10-31 DIAGNOSIS — I495 Sick sinus syndrome: Secondary | ICD-10-CM

## 2017-10-31 DIAGNOSIS — I10 Essential (primary) hypertension: Secondary | ICD-10-CM

## 2017-10-31 LAB — CUP PACEART INCLINIC DEVICE CHECK
Battery Voltage: 3.2 V
Brady Statistic AP VS Percent: 82.6 %
Brady Statistic AS VS Percent: 0.74 %
Brady Statistic RV Percent Paced: 16.66 %
Date Time Interrogation Session: 20190620120303
Implantable Lead Implant Date: 19940815
Implantable Lead Location: 753859
Implantable Lead Location: 753860
Lead Channel Pacing Threshold Amplitude: 1.25 V
Lead Channel Pacing Threshold Pulse Width: 0.5 ms
Lead Channel Sensing Intrinsic Amplitude: 2.75 mV
Lead Channel Setting Pacing Amplitude: 2.5 V
Lead Channel Setting Pacing Amplitude: 2.5 V
Lead Channel Setting Pacing Pulse Width: 0.5 ms
Lead Channel Setting Sensing Sensitivity: 1.2 mV
MDC IDC LEAD IMPLANT DT: 19940815
MDC IDC MSMT BATTERY REMAINING LONGEVITY: 176 mo
MDC IDC MSMT LEADCHNL RA IMPEDANCE VALUE: 285 Ohm
MDC IDC MSMT LEADCHNL RA IMPEDANCE VALUE: 3401 Ohm
MDC IDC MSMT LEADCHNL RA PACING THRESHOLD AMPLITUDE: 0.5 V
MDC IDC MSMT LEADCHNL RA PACING THRESHOLD PULSEWIDTH: 0.4 ms
MDC IDC MSMT LEADCHNL RV IMPEDANCE VALUE: 323 Ohm
MDC IDC MSMT LEADCHNL RV IMPEDANCE VALUE: 3401 Ohm
MDC IDC PG IMPLANT DT: 20190606
MDC IDC STAT BRADY AP VP PERCENT: 16.66 %
MDC IDC STAT BRADY AS VP PERCENT: 0 %
MDC IDC STAT BRADY RA PERCENT PACED: 99.99 %

## 2017-10-31 NOTE — Progress Notes (Signed)
Wound check appointment. Staples removed. Wound without redness or edema. Incision edges approximated, wound well healed. Normal device function. Thresholds, sensing, and impedances consistent with implant measurements. Device programmed at chronic outputs; RV PW increased to 0.105ms as threshold is 1.25V @ 0.1025ms. RV high threshold alert turned on. Histogram distribution appropriate for patient and level of activity. No mode switches or high ventricular rates noted. Patient and daughter educated about wound care, arm mobility, and Carelink monitor. ROV with [facility] on 01/17/18.

## 2017-11-01 MED ORDER — FUROSEMIDE 40 MG PO TABS: 40.0000 mg | ORAL_TABLET | Freq: Every day | ORAL | 3 refills | Status: DC

## 2017-11-21 ENCOUNTER — Telehealth: Payer: Self-pay

## 2017-11-21 NOTE — Telephone Encounter (Signed)
Called pt to advise of awv needing to be sch. No answer lvm . KH

## 2017-12-04 ENCOUNTER — Other Ambulatory Visit: Payer: Self-pay | Admitting: Cardiovascular Disease

## 2017-12-16 DIAGNOSIS — H43823 Vitreomacular adhesion, bilateral: Secondary | ICD-10-CM | POA: Diagnosis not present

## 2017-12-16 DIAGNOSIS — H35423 Microcystoid degeneration of retina, bilateral: Secondary | ICD-10-CM | POA: Diagnosis not present

## 2017-12-16 DIAGNOSIS — H353211 Exudative age-related macular degeneration, right eye, with active choroidal neovascularization: Secondary | ICD-10-CM | POA: Diagnosis not present

## 2017-12-16 DIAGNOSIS — H353223 Exudative age-related macular degeneration, left eye, with inactive scar: Secondary | ICD-10-CM | POA: Diagnosis not present

## 2018-01-08 ENCOUNTER — Other Ambulatory Visit: Payer: Self-pay | Admitting: Family Medicine

## 2018-01-08 DIAGNOSIS — I10 Essential (primary) hypertension: Secondary | ICD-10-CM

## 2018-01-17 ENCOUNTER — Encounter: Payer: Self-pay | Admitting: Cardiovascular Disease

## 2018-01-17 ENCOUNTER — Ambulatory Visit: Payer: Medicare HMO | Admitting: Cardiovascular Disease

## 2018-01-17 VITALS — BP 102/52 | HR 64 | Ht 64.0 in | Wt 132.4 lb

## 2018-01-17 DIAGNOSIS — I472 Ventricular tachycardia: Secondary | ICD-10-CM | POA: Diagnosis not present

## 2018-01-17 DIAGNOSIS — I4729 Other ventricular tachycardia: Secondary | ICD-10-CM

## 2018-01-17 DIAGNOSIS — I5032 Chronic diastolic (congestive) heart failure: Secondary | ICD-10-CM | POA: Diagnosis not present

## 2018-01-17 DIAGNOSIS — Z950 Presence of cardiac pacemaker: Secondary | ICD-10-CM | POA: Diagnosis not present

## 2018-01-17 DIAGNOSIS — I10 Essential (primary) hypertension: Secondary | ICD-10-CM | POA: Diagnosis not present

## 2018-01-17 DIAGNOSIS — I495 Sick sinus syndrome: Secondary | ICD-10-CM | POA: Diagnosis not present

## 2018-01-17 MED ORDER — METOPROLOL TARTRATE 25 MG PO TABS: ORAL_TABLET | ORAL | 3 refills | Status: DC

## 2018-01-17 NOTE — Patient Instructions (Signed)
Dr Royann Shivers has recommended making the following medication changes: 1. DECREASE Metoprolol - take 0.5 tablet (12.5 mg total) in the morning and 1 tablet (25 mg total) in the evening  Remote monitoring is used to monitor your Pacemaker or ICD from home. This monitoring reduces the number of office visits required to check your device to one time per year. It allows [place] to keep an eye on the functioning of your device to ensure it is working properly. You are scheduled for a device check from home on Friday, December 6th, 2019. You may send your transmission at any time that day. If you have a wireless device, the transmission will be sent automatically. After your physician reviews your transmission, you will receive a postcard with your next transmission date.  To improve our patient care and to more adequately follow your device, [facility] has decided, as a practice, to start following each patient four times a year with your home monitor. This means that you may experience a remote appointment that is close to an in-office appointment with your physician. Your insurance will apply at the same rate as other remote monitoring transmissions.  Dr Royann Shivers recommends that you schedule a follow-up appointment in 6 months with a pacemaker check. You will receive a reminder letter in the mail two months in advance. If you don't receive a letter, please call our office to schedule the follow-up appointment.  If you need a refill on your cardiac medications before your next appointment, please call your pharmacy.

## 2018-01-17 NOTE — Progress Notes (Signed)
Patient ID: [name], female   DOB: March 13, 1928, 84 y.o.   MRN: [id number]    Cardiology Office Note    Date:  01/17/2018   ID:  [name], [name] [birth year], MRN [id number]  PCP:  Joselyn Arrow, MD  Cardiologist:   Thurmon Fair, MD   No chief complaint on file. CHF, pacemaker at ERI  History of Present Illness:  [name] is a 84 y.o. female with sinus node dysfunction which has progressed to atrial standstill. She has normal atrioventricular conduction. She is pacemaker dependent (atrial). She initially had a pacemaker implanted in 1994 and has subsequently had 3 generator change out procedures, most recently in June 2019. She still has the original unipolar atrial and ventricular leads from 1994. She felt very poorly when she lost normal AV association when her device reached ERI earlier this year.  She improved substantially immediately after generator change.  She would like her pacemaker to be programmed faster at 70 bpm as she was accustomed to in the past.  She thinks this will improve her shortness of breath.  She has NYHA functional class II-3 exertional dyspnea.  She is very very sedentary (her pacemaker only shows 0.3 hours/day of physical activity).  She spends all day in the chair.  She has occasional swelling in her ankles and will increase her furosemide from 40 mg daily to 60 mg daily.  She did this just yesterday.  Today her blood pressure is a little lower than her usual.  She does not have dizziness, palpitations or syncope.  She denies angina at rest or with activity.  She is perpetually worried about her health, about her family and is generally an anxious person.  She has lost another 6 pounds of weight since her last appointment.  Her appetite is poor.  Her current pacemaker is a Medtronic Azure dual-chamber device implanted in June 2019 with estimated longevity of 14.5 years.  She has 100% atrial pacing and only 2.3% ventricular pacing.  There have been no episodes  of atrial fibrillation or atrial tachycardia.  3 episodes of nonsustained VT have been recorded since device implantation, all of them lasting for 5-8 beats, maximum 1 second.   Past Medical History:  Diagnosis Date  . Chest wall pain 2003   atypical, now resolved  . CKD (chronic kidney disease), stage III (HCC)    Cr 1.27, GFR 40 (07/2011)  . Diverticulitis    with DT flare in 6/09  . Gout    R hand  . Hypertension   . Hypertriglyceridemia   . Macular degeneration    Dr. Sharlot Gowda  . Pacemaker    sees Dr. Rubie Maid [facility]) annually. Pacemaker replaced 2007 [facility])  . Sick sinus syndrome (HCC) with bradycardia  . Tobacco use disorder    quit 01/2013--restarted in 2017  . Urge incontinence     Past Surgical History:  Procedure Laterality Date  . ABDOMINAL HYSTERECTOMY  1978   partial  . APPENDECTOMY  1952  . CARDIAC CATHETERIZATION  2000  . CHOLECYSTECTOMY  1998   Dr. Gerrit Friends  . COLON SURGERY  11/2009   vaginal-colon fistula repair (Drs. Delane Ginger)  . CYSTOSCOPY  2002   Dr. Wanda Plump  . EYE SURGERY  11/2004   cataract surgery b/l - Dr.Bergen  . PACEMAKER INSERTION  1994, 2000, 2007  . PPM GENERATOR CHANGEOUT N/A 10/17/2017   Procedure: PPM GENERATOR CHANGEOUT;  Surgeon: Thurmon Fair, MD;  Location: [facility] IN[facility];  Service: Cardiovascular;  Laterality: N/A;  . TUBAL LIGATION  1960    Current Medications: Outpatient Medications Prior to Visit  Medication Sig Dispense Refill  . acetaminophen (TYLENOL) 500 MG tablet Take 1,000 mg by mouth every 6 (six) hours as needed.     Marland Kitchen aspirin 81 MG tablet Take 81 mg by mouth daily.    . calcium carbonate (OS-CAL) 600 MG TABS Take 600 mg by mouth daily. With Vitamin D    . furosemide (LASIX) 40 MG tablet Take 1 tablet (40 mg total) by mouth daily. 90 tablet 3  . Multiple Vitamins-Minerals (OCUVITE PRESERVISION PO) Take 1 tablet by mouth daily.    . Omega-3 Fatty Acids (FISH OIL) 1000 MG CAPS Take 2 capsules by mouth daily.     .  metoprolol tartrate (LOPRESSOR) 25 MG tablet Take 1 tablet (25 mg total) by mouth 2 (two) times daily. 180 tablet 1   No facility-administered medications prior to visit.      Allergies:   Ciprofloxacin; Flagyl [metronidazole]; and Morphine and related   Social History   Socioeconomic History  . Marital status: Divorced    Spouse name: Not on file  . Number of children: 4  . Years of education: Not on file  . Highest education level: Not on file  Occupational History  . Occupation: retired Agricultural engineer  Social Needs  . Financial resource strain: Not on file  . Food insecurity:    Worry: Not on file    Inability: Not on file  . Transportation needs:    Medical: Not on file    Non-medical: Not on file  Tobacco Use  . Smoking status: Current Some Day Smoker    Packs/day: 0.50    Years: 40.00    Pack years: 20.00    Last attempt to quit: 01/12/2013    Years since quitting: 5.0  . Smokeless tobacco: Never Used  . Tobacco comment: smokes 1/2 PPD, quite 01/2013, restarted in 2017; smokes more when her daughter who smokes visits  Substance and Sexual Activity  . Alcohol use: No    Alcohol/week: 0.0 standard drinks  . Drug use: No  . Sexual activity: Not Currently  Lifestyle  . Physical activity:    Days per week: Not on file    Minutes per session: Not on file  . Stress: Not on file  Relationships  . Social connections:    Talks on phone: Not on file    Gets together: Not on file    Attends religious service: Not on file    Active member of club or organization: Not on file    Attends meetings of clubs or organizations: Not on file    Relationship status: Not on file  Other Topics Concern  . Not on file  Social History Narrative   Lives alone, no pets (cat died).  2 children Darel Hong and Jonny Ruiz) live at 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      Family History:  The patient's family history includes Alcohol abuse in her brother; Alzheimer's disease (age of onset: 27) in her brother; Cancer in her other and sister; Crohn's disease in her daughter; Diabetes in her brother and mother; Heart disease in her brother, father, and mother; Heart disease (age of onset: 15) in her other; Hyperlipidemia in her father and mother; Hypertension in her father and mother; Stroke (age  of onset: 18) in her other; Ulcerative colitis in her daughter.   ROS:   Please see the history of present illness.    ROS all other systems are reviewed and are negative  PHYSICAL EXAM:   VS:  BP (!) 102/52   Pulse 64   Ht 5\' 4"  (1.626 m)   Wt 132 lb 6.4 oz (60.1 kg)   BMI 22.73 kg/m      General: Alert, oriented x3, no distress, very thin. Head: no evidence of trauma, PERRL, EOMI, no exophtalmos or lid lag, no myxedema, no xanthelasma; normal ears, nose and oropharynx Neck: normal jugular venous pulsations and no hepatojugular reflux; brisk carotid pulses without delay and no carotid bruits Chest: clear to auscultation, no signs of consolidation by percussion or palpation, normal fremitus, symmetrical and full respiratory excursions.  There is substantial fat atrophy at the site of her pacemaker, but the skin has healed very well and there is no evidence of infection. Cardiovascular: normal position and quality of the apical impulse, regular rhythm, normal first and second heart sounds, no murmurs, rubs or gallops Abdomen: no tenderness or distention, no masses by palpation, no abnormal pulsatility or arterial bruits, normal bowel sounds, no hepatosplenomegaly Extremities: no clubbing, cyanosis or edema; 2+ radial, ulnar and brachial pulses bilaterally; 2+ right femoral, posterior tibial and dorsalis pedis pulses; 2+ left femoral, posterior tibial and dorsalis pedis pulses; no subclavian or femoral bruits Neurological: grossly nonfocal other than hard of  hearing Psych: Normal mood and affect  Wt Readings from Last 3 Encounters:  01/17/18 132 lb 6.4 oz (60.1 kg)  10/17/17 130 lb (59 kg)  10/01/17 139 lb 9.6 oz (63.3 kg)      Studies/Labs Reviewed:   EKG:  EKG is ordered today.  The ekg ordered today shows 100% ventricular paced rhythm without any discernible atrial activity  Recent Labs: 02/25/2017: ALT 6 03/27/2017: TSH 2.30 10/17/2017: BUN 21; Creatinine, Ser 1.31; Hemoglobin 14.0; Platelets 190; Potassium 4.6; Sodium 143   Lipid Panel    Component Value Date/Time   CHOL 182 03/27/2017 1023   TRIG 207 (H) 03/27/2017 1023   HDL 53 03/27/2017 1023   CHOLHDL 3.4 03/27/2017 1023   VLDL 47 (H) 03/01/2016 1436   LDLCALC 99 03/27/2017 1023    ASSESSMENT:    1. Chronic diastolic heart failure (HCC)   2. SSS (sick sinus syndrome) (HCC)   3. Pacemaker   4. Essential hypertension, benign   5. NSVT (nonsustained ventricular tachycardia) (HCC)      PLAN:  In order of problems listed above:  1. CHF: She had heart failure decompensation when she lost normal AV synchrony, but this has resolved.  By echo in March 2018 she has normal left ventricular systolic function, but did have evidence of diastolic dysfunction and elevated filling pressures.  Today, she does not have any clinical findings to suggest hypervolemia.  I encouraged her to weigh daily rather than weekly and to take her weight into account as well (not just the amount of ankle swelling) when she decides to take extra diuretic.  I think she may be overdoing it.  She is very compliant with sodium dietary restriction. 2. SSS: "Atrially dependent".  Although her heart rate histograms are very blunted, I think this is because she is very sedentary, not due to inadequate sensor settings. 3. PM: She has the original unipolar leads implanted in 1994 which is still providing satisfactory function.  At her request, increase the lower rate limit to  70 bpm but I doubt that this will have  positive impact on her symptoms, other than via placebo effect.  Will reassess in 3 months.  There is not a lot to lose since she does not require ventricular pacing and since she has a fresh generator that will provide long future service. 4. HTN: Good control, possibly excessive.  Part of the issue with her low blood pressure is the fact that she took extra diuretic yesterday, but I think we can try to decrease her daytime dose of beta-blocker.  This will help avoid symptomatic hypotension and reduce the risks of syncope or falls.  I would not want to stop her beta-blocker altogether since she does have occasional nonsustained VT. 5. NSVT: Episodes of very brief, infrequent and asymptomatic.  Continue some beta-blocker.     Medication Adjustments/Labs and Tests Ordered: Current medicines are reviewed at length with the patient today.  Concerns regarding medicines are outlined above.  Medication changes, Labs and Tests ordered today are listed in the Patient Instructions below. Patient Instructions  Dr Royann Shivers has recommended making the following medication changes: 1. DECREASE Metoprolol - take 0.5 tablet (12.5 mg total) in the morning and 1 tablet (25 mg total) in the evening  Remote monitoring is used to monitor your Pacemaker or ICD from home. This monitoring reduces the number of office visits required to check your device to one time per year. It allows [place] to keep an eye on the functioning of your device to ensure it is working properly. You are scheduled for a device check from home on Friday, December 6th, 2019. You may send your transmission at any time that day. If you have a wireless device, the transmission will be sent automatically. After your physician reviews your transmission, you will receive a postcard with your next transmission date.  To improve our patient care and to more adequately follow your device, [facility] has decided, as a practice, to start following each patient  four times a year with your home monitor. This means that you may experience a remote appointment that is close to an in-office appointment with your physician. Your insurance will apply at the same rate as other remote monitoring transmissions.  Dr Royann Shivers recommends that you schedule a follow-up appointment in 6 months with a pacemaker check. You will receive a reminder letter in the mail two months in advance. If you don't receive a letter, please call our office to schedule the follow-up appointment.  If you need a refill on your cardiac medications before your next appointment, please call your pharmacy.    Signed, Thurmon Fair, MD  01/17/2018 5:51 PM    [facility] [address] [place], [place], [place]  [address] Phone: [phone]; Fax: [phone]

## 2018-01-28 ENCOUNTER — Other Ambulatory Visit: Payer: Self-pay | Admitting: Cardiovascular Disease

## 2018-01-28 DIAGNOSIS — I10 Essential (primary) hypertension: Secondary | ICD-10-CM

## 2018-02-17 NOTE — Progress Notes (Signed)
Chief Complaint  Patient presents with  . Fatigue    general fatigue, SOB and weightloss. New pacemaker 10/17/17.   [name] Flu Vaccine    declined.   Patient and daughter thought this was her annual physical.  It is a month too soon (they scheduled appt through MyChart, and had listed complaints of fatigue, SOB, Wt loss on appt schedule). She denies any acute complaints, and is complaining about having to come back for physical--especially since she refuses so much of it. Discussed that today would be blood work (she is fasting), exam and JUST AWV next month, unless new complaints/symptoms arise.  Saw Dr. Loletha Grayer last month.  No e/o CHF. Worried that she may be over-diuresing some, recommended checking daily weights (to rely on that rather than just ankle swelling along to adjust her diuretics).  Her metoprolol dose was decreased at her last visit, now taking 1/2 tablet (12.39m) in the morning and 267min the evening. BP's have been running 110-120/70, denies dizziness.  She decreased the lasix to just 4025maily, no longer takes potassium (only when taking 73m36mWeights at home (qod) has maintained 130.   Tobacco use: She smoked 1/2 PPD x 40 years, quit in 2014, but restarted 2-3 years ago, now smoking 4/day, after eating. She still reports that she really doesn't have much else to enjoy, with her losing her hearing, vision, being unable to drive. One of her daughters smokes (MarTomi Bambergernd they tend to smoke more when together.  She confirms this today (she enjoys it, not interested in quitting).   PMH, PSH,Bridgeton reviewed  Outpatient Encounter Medications as of 02/18/2018  Medication Sig Note  . aspirin 81 MG tablet Take 81 mg by mouth daily.   . calcium carbonate (OS-CAL) 600 MG TABS Take 600 mg by mouth daily. With Vitamin D   . furosemide (LASIX) 40 MG tablet Take 1 tablet (40 mg total) by mouth daily.   . metoprolol tartrate (LOPRESSOR) 25 MG tablet Take 0.5 tablet (12.5 mg total) in the morning and  take 1 tablet (25 mg total) in the afternoon.   . Multiple Vitamins-Minerals (OCUVITE PRESERVISION PO) Take 1 tablet by mouth daily.   . Omega-3 Fatty Acids (FISH OIL) 1000 MG CAPS Take 2 capsules by mouth daily.    . acMarland Kitchentaminophen (TYLENOL) 500 MG tablet Take 1,000 mg by mouth every 6 (six) hours as needed.    . potassium chloride (K-DUR) 10 MEQ tablet Take 10 mEq by mouth daily. 02/18/2018: Uses this only when taking 73mg51mlasix  . [DISCONTINUED] metoprolol tartrate (LOPRESSOR) 25 MG tablet TAKE 1 TABLET BY MOUTH TWICE A DAY    No facility-administered encounter medications on file as of 02/18/2018.    Allergies  Allergen Reactions  . Ciprofloxacin Hives and Other (See Comments)    Got hives after 10 days of both cipro and flagyl, not sure which caused the hives  . Flagyl [Metronidazole] Hives and Other (See Comments)    Got hives after 10 days of both cipro and flagyl, not sure which caused the hives  . Morphine And Related Nausea And Vomiting   ROS: no headaches, dizziness, syncope, chest pain, palpitations shortness of breath.  Has generalized fatigue.  +vision loss and hearing loss. No GI complaints. No bleeding, bruising, rash, edema or other complaints.  +Decreased appetite   PHYSICAL EXAM:  BP 108/60   Pulse 72   Ht _0  (1.626 m)   Wt 131 lb 12.8 oz (59.8 kg)  SpO2 97%   BMI 22.62 kg/m    Wt Readings from Last 3 Encounters:  01/17/18 132 lb 6.4 oz (60.1 kg)  10/17/17 130 lb (59 kg)  10/01/17 139 lb 9.6 oz (63.3 kg)   Elderly female, in no distress. Hard of hearing HEENT: conjunctiva and sclera are clear, EOMI. Nasal mucosa is mild-moderately edematous, pale, some mucus in posterior OP, white. OP with dry mucuous membranes Neck: no lymphadenopathy, thyromegaly or mass Chest: Prominent pacemaker R upper chest, nontender Heart: regular rate and rhythm;  Murmur 2/6 at RUSB, slight radiation to carotids Lungs: distant, but clear. No wheezes, rales ronchi Abdomen:  soft, nontender, no organomegaly or mass Back: no spinal or CVA tenderness Breasts: She declined to get undressed.  Able to perform exam through her unpadded bra which was loose--no dimpling/puckering, nipple discharge, masses or axillary lymphadenopathy were noted Pelvic and rectal were refused by pt Extremities: no edema Psych: flat affect, perhaps slightly depressed (feels like there isn't anything left to enjoy--full screen not done, no different than in the last many years per daughter who does not express concern regarding any worsening moods when asked). Normal hygiene, grooming, eye contact, speech.   ASSESSMENT/PLAN:  Bilateral hearing loss, unspecified hearing loss type - encouraged to discuss options/cost with audiologist and check insurance; would have better QOL if could hear better (books on tape, TV)  Chronic diastolic heart failure (HCC) - stable, no fluid overload today  Essential hypertension, benign - well controlled - Plan: Comprehensive metabolic panel  Tobacco abuse - encouraged/counseled re: risks, cessation, risks to her daughter who should also quit  Mixed hyperlipidemia - now taking fish oil (gemfibrozil stopped in 2017); continue lowfat diet - Plan: Lipid panel  Medication monitoring encounter - Plan: CBC with Differential/Platelet, Comprehensive metabolic panel, Lipid panel, TSH  Weight loss - weight has been stable since June; mild changes related to fluids; encouraged proper diet despite some decreased appetite - Plan: CBC with Differential/Platelet, Comprehensive metabolic panel, TSH   High dose flu shot recommended, along with other vaccines that are due/past due, refuses all.  labs--CBC, c-met, TSH Add B12 only if MCV high on this check   Due for AWV 03/2018 Refuses most things--discussed that next visit would be questionnaires, etc, nothing invasive (exam done today, part that she is willing to have anyway, refuses vaccines, and labs done  today).  Counseled re: hearing loss, QOL, getting out of the house, activities that may give her joy, etc.  30-35 min visit, more than 1/2 spent counseling.

## 2018-02-18 ENCOUNTER — Encounter: Payer: Self-pay | Admitting: Family Medicine

## 2018-02-18 ENCOUNTER — Ambulatory Visit (INDEPENDENT_AMBULATORY_CARE_PROVIDER_SITE_OTHER): Payer: Medicare HMO | Admitting: Family Medicine

## 2018-02-18 VITALS — BP 108/60 | HR 72 | Ht 64.0 in | Wt 131.8 lb

## 2018-02-18 DIAGNOSIS — I10 Essential (primary) hypertension: Secondary | ICD-10-CM | POA: Diagnosis not present

## 2018-02-18 DIAGNOSIS — Z5181 Encounter for therapeutic drug level monitoring: Secondary | ICD-10-CM | POA: Diagnosis not present

## 2018-02-18 DIAGNOSIS — Z720 Tobacco use: Secondary | ICD-10-CM

## 2018-02-18 DIAGNOSIS — R634 Abnormal weight loss: Secondary | ICD-10-CM | POA: Diagnosis not present

## 2018-02-18 DIAGNOSIS — E782 Mixed hyperlipidemia: Secondary | ICD-10-CM | POA: Diagnosis not present

## 2018-02-18 DIAGNOSIS — I5032 Chronic diastolic (congestive) heart failure: Secondary | ICD-10-CM

## 2018-02-18 DIAGNOSIS — H9193 Unspecified hearing loss, bilateral: Secondary | ICD-10-CM | POA: Diagnosis not present

## 2018-02-18 NOTE — Patient Instructions (Signed)
Please consider following up with the audiologist, to learn about your various options to help with your hearing, depending on what is covered and what is affordable.  That will open up a lot of other things for you to enjoy on a daily basis (music, books on tape, etc). Try and get out of the house, and some more interactions with other people.  I highly recommend regular vaccinations, but I understand that you refuse all of these. I strongly encourage that you quit smoking entirely (and your daughter!), as this can potentially further affect your quality of life

## 2018-02-19 LAB — CBC WITH DIFFERENTIAL/PLATELET
Basophils Absolute: 0 10*3/uL (ref 0.0–0.2)
Basos: 0 %
EOS (ABSOLUTE): 0.1 10*3/uL (ref 0.0–0.4)
EOS: 1 %
HEMATOCRIT: 41.9 % (ref 34.0–46.6)
HEMOGLOBIN: 14.6 g/dL (ref 11.1–15.9)
IMMATURE GRANS (ABS): 0 10*3/uL (ref 0.0–0.1)
Immature Granulocytes: 0 %
LYMPHS ABS: 2.2 10*3/uL (ref 0.7–3.1)
Lymphs: 23 %
MCH: 32.4 pg (ref 26.6–33.0)
MCHC: 34.8 g/dL (ref 31.5–35.7)
MCV: 93 fL (ref 79–97)
MONOCYTES: 8 %
Monocytes Absolute: 0.8 10*3/uL (ref 0.1–0.9)
Neutrophils Absolute: 6.2 10*3/uL (ref 1.4–7.0)
Neutrophils: 68 %
Platelets: 227 10*3/uL (ref 150–450)
RBC: 4.5 x10E6/uL (ref 3.77–5.28)
RDW: 13.5 % (ref 12.3–15.4)
WBC: 9.3 10*3/uL (ref 3.4–10.8)

## 2018-02-19 LAB — COMPREHENSIVE METABOLIC PANEL
ALT: 5 IU/L (ref 0–32)
AST: 20 IU/L (ref 0–40)
Albumin/Globulin Ratio: 1.7 (ref 1.2–2.2)
Albumin: 4.2 g/dL (ref 3.5–4.7)
Alkaline Phosphatase: 63 IU/L (ref 39–117)
BUN/Creatinine Ratio: 19 (ref 12–28)
BUN: 22 mg/dL (ref 8–27)
Bilirubin Total: 0.6 mg/dL (ref 0.0–1.2)
CALCIUM: 10.7 mg/dL — AB (ref 8.7–10.3)
CO2: 33 mmol/L — AB (ref 20–29)
CREATININE: 1.17 mg/dL — AB (ref 0.57–1.00)
Chloride: 98 mmol/L (ref 96–106)
GFR calc Af Amer: 48 mL/min/{1.73_m2} — ABNORMAL LOW (ref >59)
GFR, EST NON AFRICAN AMERICAN: 42 mL/min/{1.73_m2} — AB (ref >59)
GLOBULIN, TOTAL: 2.5 g/dL (ref 1.5–4.5)
Glucose: 100 mg/dL — ABNORMAL HIGH (ref 65–99)
Potassium: 4.7 mmol/L (ref 3.5–5.2)
Sodium: 147 mmol/L — ABNORMAL HIGH (ref 134–144)
Total Protein: 6.7 g/dL (ref 6.0–8.5)

## 2018-02-19 LAB — LIPID PANEL
CHOL/HDL RATIO: 3.6 ratio (ref 0.0–4.4)
Cholesterol, Total: 172 mg/dL (ref 100–199)
HDL: 48 mg/dL (ref >39)
LDL CALC: 92 mg/dL (ref 0–99)
TRIGLYCERIDES: 162 mg/dL — AB (ref 0–149)
VLDL Cholesterol Cal: 32 mg/dL (ref 5–40)

## 2018-02-19 LAB — TSH: TSH: 3.96 u[IU]/mL (ref 0.450–4.500)

## 2018-02-21 ENCOUNTER — Encounter: Payer: Self-pay | Admitting: Family Medicine

## 2018-03-24 DIAGNOSIS — H353211 Exudative age-related macular degeneration, right eye, with active choroidal neovascularization: Secondary | ICD-10-CM | POA: Diagnosis not present

## 2018-03-24 DIAGNOSIS — H35423 Microcystoid degeneration of retina, bilateral: Secondary | ICD-10-CM | POA: Diagnosis not present

## 2018-03-24 DIAGNOSIS — H353223 Exudative age-related macular degeneration, left eye, with inactive scar: Secondary | ICD-10-CM | POA: Diagnosis not present

## 2018-03-24 DIAGNOSIS — H43823 Vitreomacular adhesion, bilateral: Secondary | ICD-10-CM | POA: Diagnosis not present

## 2018-03-30 NOTE — Progress Notes (Signed)
Chief Complaint  Patient presents with  . Medicare Wellness    nonfasting AWV. Has a place on her left palm she would like you to look at.     [name] is a 84 y.o. female who presents for annual wellness visit. She was seen last month for med check and had fasting labs.  She was noted to have poor quality of life, affected by her hearing loss, and was encouraged to follow up with audiologist, and try and get out of the house more. She seems to be in a little better mood today. Accompanied by [name] (and [name], who is visiting, stayed in waiting room).  Noticed a lump on the left palm a few months ago.  It doesn't hurt.  No history of trauma.  No change since she first noticed it. No triggering of the finger.  Smokes after a meal, sometimes goes all day without smoking. [name] moved to [place], so if over less often, smokes a little less.   [name] visits every other week, and they smoke together.  Feeling better since pacemaker rate was increased from 60 to 70 recently by cardiologist. Energy is better.  See recent visit for details.   There is no immunization history on file for this patient. "I haven't taken any vaccines in a long, long time"--she refuses them all.  Last Pap smear: s/p hysterectomy; last in 2011; refuses pelvic exams Last mammogram: 09/2014; she decided not to have them anymore. Last colonoscopy: she believes it was 2009, declines further Last DEXA: many years ago; we have ordered and discussed, but she didn't schedule appointment Dentist:--doesn't go, has upper and lower dentures Ophtho: every 14weeks to Dr. Lita Mains for injections Exercise: Walks some in the home (not 10 minutes straight). No longer walks to mailbox, fear of falling.No weight-bearing exercise.Uses cane to walk.   Other doctors caring for patient include: Cardiolologist: Dr. Rubie Maid Ophtho: Dr. Sharlot Gowda, Dr. Lita Mains (retinal specialist) GYN:Dr. Henderson Cloud (hasn't seen him in many  years) GI: Dr. Randa Evens, last seen in 2009  Depression screen: negative  Fall Screen: negative ADL screen--some trouble hearing/seeing and trouble going up stairs (gets windedon stairs, also has trouble/concerns due to vision). +urinary incontinence and now more fecal incontinence. Wears Depends. Seefull questionnaires in epic. She has 3 dedicated daughers who help out with medications, finances, household.  Mini-Cog screen: recalled 3 words; unable to perform the clock drawing correctly due to vision impairment.  End of Life Discussion: Patient hasa living will and medical power of attorney  Past Medical History:  Diagnosis Date  . Chest wall pain 2003   atypical, now resolved  . CKD (chronic kidney disease), stage III (HCC)    Cr 1.27, GFR 40 (07/2011)  . Diverticulitis    with DT flare in 6/09  . Gout    R hand  . Hypertension   . Hypertriglyceridemia   . Macular degeneration    Dr. Sharlot Gowda  . Pacemaker    sees Dr. Rubie Maid [facility]) annually. Pacemaker replaced 2007 [facility])  . Sick sinus syndrome (HCC) with bradycardia  . Tobacco use disorder    quit 01/2013--restarted in 2017  . Urge incontinence     Past Surgical History:  Procedure Laterality Date  . ABDOMINAL HYSTERECTOMY  1978   partial  . APPENDECTOMY  1952  . CARDIAC CATHETERIZATION  2000  . CHOLECYSTECTOMY  1998   Dr. Gerrit Friends  . COLON SURGERY  11/2009   vaginal-colon fistula repair (Drs. Delane Ginger)  . CYSTOSCOPY  2002  Dr. Wanda Plump  . EYE SURGERY  11/2004   cataract surgery b/l - Dr.Bergen  . PACEMAKER INSERTION  1994, 2000, 2007  . PPM GENERATOR CHANGEOUT N/A 10/17/2017   Procedure: PPM GENERATOR CHANGEOUT;  Surgeon: Thurmon Fair, MD;  Location: [facility] INVASIVE [facility];  Service: Cardiovascular;  Laterality: N/A;  . TUBAL LIGATION  1960    Social History   Socioeconomic History  . Marital status: Divorced    Spouse name: Not on file  . Number of children: 4  . Years of education: Not on file   . Highest education level: Not on file  Occupational History  . Occupation: retired Agricultural engineer  Social Needs  . Financial resource strain: Not on file  . Food insecurity:    Worry: Not on file    Inability: Not on file  . Transportation needs:    Medical: Not on file    Non-medical: Not on file  Tobacco Use  . Smoking status: Current Some Day Smoker    Packs/day: 0.50    Years: 40.00    Pack years: 20.00    Last attempt to quit: 01/12/2013    Years since quitting: 5.2  . Smokeless tobacco: Never Used  . Tobacco comment: smokes 1/2 PPD, quite 01/2013, restarted in 2017; smokes more when her daughter who smokes visits  Substance and Sexual Activity  . Alcohol use: No    Alcohol/week: 0.0 standard drinks  . Drug use: No  . Sexual activity: Not Currently  Lifestyle  . Physical activity:    Days per week: Not on file    Minutes per session: Not on file  . Stress: Not on file  Relationships  . Social connections:    Talks on phone: Not on file    Gets together: Not on file    Attends religious service: Not on file    Active member of club or organization: Not on file    Attends meetings of clubs or organizations: Not on file    Relationship status: Not on file  . Intimate partner violence:    Fear of current or ex partner: Not on file    Emotionally abused: Not on file    Physically abused: Not on file    Forced sexual activity: Not on file  Other Topics Concern  . Not on file  Social History Narrative   Lives alone, no pets (cat died).  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 She has been offered to move to the beach but she doesn't want to.   (She used to visits with the elderly at nursing homes)    Family History  Problem Relation Age of Onset  . Diabetes Mother   . Heart disease Mother   . Hyperlipidemia Mother   . Hypertension Mother   . Heart disease Father        pacemaker, CABG  .  Hyperlipidemia Father   . Hypertension Father   . Cancer Sister        breast cancer 28's, then recurrence, metastatic, died at 30  . Diabetes Brother   . Alcohol abuse Brother   . Heart disease Brother   . Crohn's disease Daughter   . Alzheimer's disease Brother 58  . Ulcerative colitis Daughter   . Alcohol abuse Daughter        >30 years sober 2019  . Heart disease Other 62  . Stroke Other 62  . Cancer  Other        breast    Outpatient Encounter Medications as of 03/31/2018  Medication Sig Note  . aspirin 81 MG tablet Take 81 mg by mouth daily.   . calcium carbonate (OS-CAL) 600 MG TABS Take 600 mg by mouth daily. With Vitamin D   . furosemide (LASIX) 40 MG tablet Take 1 tablet (40 mg total) by mouth daily.   . metoprolol tartrate (LOPRESSOR) 25 MG tablet Take 0.5 tablet (12.5 mg total) in the morning and take 1 tablet (25 mg total) in the afternoon.   . Multiple Vitamins-Minerals (OCUVITE PRESERVISION PO) Take 1 tablet by mouth daily.   . Omega-3 Fatty Acids (FISH OIL) 1000 MG CAPS Take 2 capsules by mouth daily.    . [DISCONTINUED] lisinopril-hydrochlorothiazide (PRINZIDE,ZESTORETIC) 20-25 MG tablet    . acetaminophen (TYLENOL) 500 MG tablet Take 1,000 mg by mouth every 6 (six) hours as needed.    . potassium chloride (K-DUR) 10 MEQ tablet Take 10 mEq by mouth daily. 02/18/2018: Uses this only when taking 60mg  of lasix   No facility-administered encounter medications on file as of 03/31/2018.     Allergies  Allergen Reactions  . Ciprofloxacin Hives and Other (See Comments)    Got hives after 10 days of both cipro and flagyl, not sure which caused the hives  . Flagyl [Metronidazole] Hives and Other (See Comments)    Got hives after 10 days of both cipro and flagyl, not sure which caused the hives  . Morphine And Related Nausea And Vomiting   ROS: The patient denies anorexia, fever, headaches, ear pain, sore throat, breast concerns, chest pain, palpitations, dizziness,  syncope, dyspnea on exertion, cough, swelling, nausea, vomiting, diarrhea, constipation, abdominal pain, melena, hematochezia, indigestion/heartburn, hematuria, dysuria, vaginal bleeding, discharge, odor or itch, genital lesions, joint pains, numbness, tingling, weakness, tremor, depression, anxiety, abnormal bleeding/bruising, or enlarged lymph nodes. No pain. Some bruising when she bumps into things, stable, unchanged, and no other bleeding. Chronic hearing loss and vision loss Shortness of breath if she walks stairs or long distances or too quickly. Urinary incontinence, fecal incontinence (some urgency) She feels cold a lot, no recent change Lump on left palm per HPI   PHYSICAL EXAM:  Wt Readings from Last 3 Encounters:  02/18/18 131 lb 12.8 oz (59.8 kg)  01/17/18 132 lb 6.4 oz (60.1 kg)  10/17/17 130 lb (59 kg)   Full exam (as allowed by patient) was performed at her visit last month, declined today. She is in good spirits today. Conjunctiva and sclera are clear, EOMI. Hard of hearing. She is alert and oriented, cranial nerves intact.  Walks with walker Left hand--386mm subcutaneous nodule, nontender, overlying the palmar aspect of the left 4th metacarpal. No contracture, FROM of fingers, no triggering   ASSESSMENT/PLAN:  Medicare annual wellness visit, subsequent  Ganglion cyst of tendon sheath of left hand - return if increasing in size, if painful, if affecting use of finger  Vaccine counseling - she declines TdaP, Shingrix (both encouraged, to get from pharm), pneumovax/prevnar and flu  (can be given here)  Tobacco abuse - counseled re: smoking cessation. Encouraged NOT to smoke when home alone, just when LindsayMarcia visits (if at all)   Ganglion cyst suspected. Doubt early dupuytren's. Add'l eval if increasing in size, affecting use of hand.  MOST form reviewed and updated. Full Code.  Doesn't want prolonged measures, has living will/advanced directives.    Counseled re:  Tdap, pneumococcal vaccines, yearly flu shots and Shingrix.  She declines all vaccines. Risks/benefits, reasons these are recommended was reviewed in detail. Advised that prevnar/pneumovax and flu shot can be given in the office, Tdap and Shingrix from her pharmacy. She declines.  Discussed monthly self breast exams (does them); declines mammograms; at least 30 minutes of aerobic activity at least 5 days/week, weight bearing exercise 2x/week; proper sunscreen use reviewed; healthy diet, including goals of calcium and vitamin D intake and alcohol recommendations (less than or equal to 1 drink/day) reviewed; regular seatbelt use; changing batteries in smoke detectors. Immunization recommendations discussed as above--declines all. Colonoscopy recommendations reviewed, not recommended at this point due to age, patient preference/declines. DEXA recommended, discussed in detail at prior visits, offered again today, declines.  Counseled re: smoking.   Medicare Attestation I have personally reviewed: The patient's medical and social history Their use of alcohol, tobacco or illicit drugs Their current medications and supplements The patient's functional ability including ADLs,fall risks, home safety risks, cognitive, and hearing and visual impairment Diet and physical activities Evidence for depression or mood disorders  The patient's weight, height and BMI have been recorded in the chart.  I have made referrals, counseling, and provided education to the patient based on review of the above and I have provided the patient with a written personalized care plan for preventive services.

## 2018-03-31 ENCOUNTER — Encounter: Payer: Self-pay | Admitting: Family Medicine

## 2018-03-31 ENCOUNTER — Ambulatory Visit (INDEPENDENT_AMBULATORY_CARE_PROVIDER_SITE_OTHER): Payer: Medicare HMO | Admitting: Family Medicine

## 2018-03-31 VITALS — BP 116/70 | HR 84 | Ht 64.0 in | Wt 133.4 lb

## 2018-03-31 DIAGNOSIS — Z7189 Other specified counseling: Secondary | ICD-10-CM | POA: Diagnosis not present

## 2018-03-31 DIAGNOSIS — Z720 Tobacco use: Secondary | ICD-10-CM

## 2018-03-31 DIAGNOSIS — Z0000 Encounter for general adult medical examination without abnormal findings: Secondary | ICD-10-CM

## 2018-03-31 DIAGNOSIS — M67442 Ganglion, left hand: Secondary | ICD-10-CM

## 2018-03-31 DIAGNOSIS — Z7185 Encounter for immunization safety counseling: Secondary | ICD-10-CM

## 2018-03-31 NOTE — Patient Instructions (Addendum)
  [name] , Thank you for taking time to come for your Medicare Wellness Visit. I appreciate your ongoing commitment to your health goals. Please review the following plan we discussed and let me know if I can assist you in the future.    This is a list of the screening recommended for you and due dates:  Health Maintenance  Topic Date Due  . Flu Shot  08/12/2018*  . Tetanus Vaccine  02/22/2019*  . Pneumonia vaccines (1 of 2 - PCV13) 02/22/2019*  . DEXA scan (bone density measurement)  Completed  *Topic was postponed. The date shown is not the original due date.   These vaccines were discussed and declined at your recent visit (which is why the due date has been postponed). These vaccines are all highly recommended, as we discussed in detail, as well as the Shingles vaccine.  I recommend getting the new shingles vaccine (Shingrix). You will need to check with your insurance to see if it is covered, and if covered by Medicare Part D, you need to get from the pharmacy rather than our office.  It is a series of 2 injections, spaced 2 months apart.  We discussed trying to get weight-bearing exercise at least 2-3 times/week, and how to safely do this, as well as getting some aerobic exercise in a safe manner.   Consider going to a senior center or gym for [employer] classes.  Please follow up with the audiologist to see what type of hearing assistance is available to you.  Please quit smoking.  Do not smoke when Leta JunglingMarcia isn't there with you (preferably not with her either!).

## 2018-05-15 ENCOUNTER — Other Ambulatory Visit: Payer: Self-pay | Admitting: Family Medicine

## 2018-05-15 DIAGNOSIS — Z1231 Encounter for screening mammogram for malignant neoplasm of breast: Secondary | ICD-10-CM

## 2018-06-12 ENCOUNTER — Ambulatory Visit
Admission: RE | Admit: 2018-06-12 | Discharge: 2018-06-12 | Disposition: A | Discharge status: Home / Self Care | Payer: Medicare HMO | Source: Ambulatory Visit | Attending: Family Medicine | Admitting: Family Medicine

## 2018-06-12 DIAGNOSIS — Z1231 Encounter for screening mammogram for malignant neoplasm of breast: Secondary | ICD-10-CM

## 2018-06-12 LAB — CUP PACEART INCLINIC DEVICE CHECK
Date Time Interrogation Session: 20200130133623
Implantable Lead Implant Date: 19940815
Implantable Lead Location: 753860
Implantable Pulse Generator Implant Date: 20190606
Implantable Pulse Generator Implant Date: 20190606
MDC IDC LEAD IMPLANT DT: 19940815
MDC IDC LEAD LOCATION: 753859
MDC IDC SESS DTM: 20200130143810

## 2018-06-19 ENCOUNTER — Telehealth: Payer: Self-pay | Admitting: Cardiovascular Disease

## 2018-06-19 NOTE — Telephone Encounter (Signed)
Spoke with daughter and pt has been complaining of left breast burning sensation comes and goes for 2 weeks Pt has had mammogram and this was clear Per daughter B/P fine Pt feels this is coming from PM leads will forward to Dr Royann Shivers for review . Pt has appt with you but not until April is this okay ?/cy

## 2018-06-19 NOTE — Telephone Encounter (Signed)
New Message   Patient states she has a burning sensation near her L Breast on her chest thinks it's coming from the leeds of her pacemaker.  Daughter would like a nurse to call her back to maybe get a sooner appointment to see the doctor.

## 2018-06-19 NOTE — Telephone Encounter (Signed)
Appt made with Corine Shelter for tom at 2:30 pm and pt's daughter aware .[name]

## 2018-06-19 NOTE — Telephone Encounter (Signed)
Pt's daughter aware of recommendations and says there is no redness pt feels there is some swelling Daughter to call sister and check if there is any swelling as sister is with pt at this time  ./cy

## 2018-06-19 NOTE — Telephone Encounter (Signed)
If there is any redness or swelling or other potential sign of infection in that area, I would like to see ASAP. Otherwise can wait until April. MCr

## 2018-06-20 ENCOUNTER — Ambulatory Visit: Payer: Medicare HMO | Admitting: Cardiology

## 2018-06-20 ENCOUNTER — Encounter: Payer: Self-pay | Admitting: Cardiology

## 2018-06-20 VITALS — BP 132/60 | HR 70 | Ht 64.0 in | Wt 133.2 lb

## 2018-06-20 DIAGNOSIS — N183 Chronic kidney disease, stage 3 unspecified: Secondary | ICD-10-CM

## 2018-06-20 DIAGNOSIS — I10 Essential (primary) hypertension: Secondary | ICD-10-CM | POA: Diagnosis not present

## 2018-06-20 DIAGNOSIS — Z950 Presence of cardiac pacemaker: Secondary | ICD-10-CM

## 2018-06-20 DIAGNOSIS — R0609 Other forms of dyspnea: Secondary | ICD-10-CM | POA: Insufficient documentation

## 2018-06-20 DIAGNOSIS — R079 Chest pain, unspecified: Secondary | ICD-10-CM | POA: Diagnosis not present

## 2018-06-20 DIAGNOSIS — I5032 Chronic diastolic (congestive) heart failure: Secondary | ICD-10-CM

## 2018-06-20 DIAGNOSIS — R0600 Dyspnea, unspecified: Secondary | ICD-10-CM

## 2018-06-20 NOTE — Assessment & Plan Note (Signed)
Echo March 2018- Systolic function was normal.   The estimated ejection fraction was in the range of 55% to 60%.   Wall motion was normal; there were no regional wall motion   abnormalities. Doppler parameters are consistent with both   elevated ventricular end-diastolic filling pressure and elevated   left atrial filling pressure.

## 2018-06-20 NOTE — Patient Instructions (Addendum)
Medication Instructions:  Your physician recommends that you continue on your current medications as directed. Please refer to the Current Medication list given to you today. If you need a refill on your cardiac medications before your next appointment, please call your pharmacy.   Lab work: None  If you have labs (blood work) drawn today and your tests are completely normal, you will receive your results only by: Marland Kitchen MyChart Message (if you have MyChart) OR . A paper copy in the mail If you have any lab test that is abnormal or we need to change your treatment, we will call you to review the results.  Testing/Procedures: Your physician has requested that you have an echocardiogram. Echocardiography is a painless test that uses sound waves to create images of your heart. It provides your doctor with information about the size and shape of your heart and how well your heart's chambers and valves are working. This procedure takes approximately one hour. There are no restrictions for this procedure. [address] [place]  Follow-Up: At [facility], you and your health needs are our priority.  As part of our continuing mission to provide you with exceptional heart care, we have created designated Provider Care Teams.  These Care Teams include your primary Cardiologist (physician) and Advanced Practice Providers (APPs -  Physician Assistants and Nurse Practitioners) who all work together to provide you with the care you need, when you need it. You will need a follow up appointment in 2-3 months.    You may see Dr Rachelle Hora Croitoru ONLY.  Any Other Special Instructions Will Be Listed Below (If Applicable).

## 2018-06-20 NOTE — Assessment & Plan Note (Signed)
Last Gen change was June 2019

## 2018-06-20 NOTE — Assessment & Plan Note (Signed)
Pt complains of DOE- increased the last few weeks

## 2018-06-20 NOTE — Progress Notes (Signed)
06/20/2018 [name]   1928/02/25  [id number]  Primary Physician Joselyn Arrow, MD Primary Cardiologist: Dr Royann Shivers  HPI: [name] is a longtime patient of Dr. Erin Hearing with a history of PAT and sick sinus syndrome.  She had a Medtronic pacemaker implanted in 1994.  She has had 3 generator changes since.  She has original leads.  Her last generator change was June 2019.  She also has chronic diastolic failure, echocardiogram in March 2018 showed normal LV function with elevated LVEDP.  She has chronic renal insufficiency stage III with GFR's in the 40s.  She is in the office today for evaluation after she called complaining of "burning" on her left chest wall.  The patient was concerned her pacer lead was infected.  Her daughter who is an Charity fundraiser accompanied her today.  The patient has had no fever or chills at home.  Her chest wall shows no discoloration or unusual warmth or tenderness.  The patient's history is of fairly constant left chest breast burning. Tylenol helps. She also has noted increased dyspnea on exertion over the last couple weeks.  The patient has no history of coronary disease.  She had a remote Myoview in 2010 that was normal.   Current Outpatient Medications  Medication Sig Dispense Refill  . acetaminophen (TYLENOL) 500 MG tablet Take 1,000 mg by mouth every 6 (six) hours as needed.     Marland Kitchen aspirin 81 MG tablet Take 81 mg by mouth daily.    . calcium carbonate (OS-CAL) 600 MG TABS Take 600 mg by mouth daily. With Vitamin D    . furosemide (LASIX) 40 MG tablet Take 1 tablet (40 mg total) by mouth daily. 90 tablet 3  . metoprolol tartrate (LOPRESSOR) 25 MG tablet Take 0.5 tablet (12.5 mg total) in the morning and take 1 tablet (25 mg total) in the afternoon. 135 tablet 3  . Multiple Vitamins-Minerals (OCUVITE PRESERVISION PO) Take 1 tablet by mouth daily.    . Omega-3 Fatty Acids (FISH OIL) 1000 MG CAPS Take 2 capsules by mouth daily.     . potassium chloride (K-DUR) 10 MEQ  tablet Take 10 mEq by mouth daily.  3   No current facility-administered medications for this visit.     Allergies  Allergen Reactions  . Ciprofloxacin Hives and Other (See Comments)    Got hives after 10 days of both cipro and flagyl, not sure which caused the hives  . Flagyl [Metronidazole] Hives and Other (See Comments)    Got hives after 10 days of both cipro and flagyl, not sure which caused the hives  . Morphine And Related Nausea And Vomiting    Past Medical History:  Diagnosis Date  . Chest wall pain 2003   atypical, now resolved  . CKD (chronic kidney disease), stage III (HCC)    Cr 1.27, GFR 40 (07/2011)  . Diverticulitis    with DT flare in 6/09  . Gout    R hand  . Hypertension   . Hypertriglyceridemia   . Macular degeneration    Dr. Sharlot Gowda  . Pacemaker    sees Dr. Rubie [facility]) annually. Pacemaker replaced 2007 [facility])  . Sick sinus syndrome (HCC) with bradycardia  . Tobacco use disorder    quit 01/2013--restarted in 2017  . Urge incontinence     Social History   Socioeconomic History  . Marital status: Divorced    Spouse name: Not on file  . Number of children: 4  . Years  of education: Not on file  . Highest education level: Not on file  Occupational History  . Occupation: retired Agricultural engineernursing assistant  Social Needs  . Financial resource strain: Not on file  . Food insecurity:    Worry: Not on file    Inability: Not on file  . Transportation needs:    Medical: Not on file    Non-medical: Not on file  Tobacco Use  . Smoking status: Current Some Day Smoker    Packs/day: 0.50    Years: 40.00    Pack years: 20.00    Last attempt to quit: 01/12/2013    Years since quitting: 5.4  . Smokeless tobacco: Never Used  . Tobacco comment: smokes 1/2 PPD, quite 01/2013, restarted in 2017; smokes more when her daughter who smokes visits  Substance and Sexual Activity  . Alcohol use: No    Alcohol/week: 0.0 standard drinks  . Drug use: No  . Sexual activity:  Not Currently  Lifestyle  . Physical activity:    Days per week: Not on file    Minutes per session: Not on file  . Stress: Not on file  Relationships  . Social connections:    Talks on phone: Not on file    Gets together: Not on file    Attends religious service: Not on file    Active member of club or organization: Not on file    Attends meetings of clubs or organizations: Not on file    Relationship status: Not on file  . Intimate partner violence:    Fear of current or ex partner: Not on file    Emotionally abused: Not on file    Physically abused: Not on file    Forced sexual activity: Not on file  Other Topics Concern  . Not on file  Social History Narrative   Lives alone, no pets (cat died).  3 children Darel Hong(Judy, Leta JunglingMarcia and Jonny RuizJohn[place], Information systems managerDeborah (RN at neuro ICU) lives in [place]. [place] been [place] but she doesn't want to.   (She used to visits with the elderly at nursing homes)     Family History  Problem Relation Age of Onset  . Diabetes Mother   . Heart disease Mother   . Hyperlipidemia Mother   . Hypertension Mother   . Heart disease Father        pacemaker, CABG  . Hyperlipidemia Father   . Hypertension Father   . Cancer Sister        breast cancer 370's, then recurrence, metastatic, died at 5483  . Diabetes Brother   . Alcohol abuse Brother   . Heart disease Brother   . Crohn's disease Daughter   . Alzheimer's disease Brother [id number]  . Ulcerative colitis Daughter   . Alcohol abuse Daughter        >30 years sober 2019  . Heart disease Other 62  . Stroke Other 62  . Cancer Other        breast     Review of Systems: General: negative for chills, fever, night sweats or weight changes.  Cardiovascular: negative for edema, orthopnea, palpitations, paroxysmal nocturnal dyspnea Dermatological: negative for rash Respiratory: negative for cough or wheezing Urologic: negative for hematuria Abdominal: negative for nausea,  vomiting, diarrhea, bright red blood per rectum, melena, or hematemesis Neurologic: negative for visual changes, syncope, or dizziness All other systems reviewed and are otherwise negative except as noted above.    Blood pressure  132/60, pulse (!) 152, height 5\' 4"  (1.626 m), weight 133 lb 3.2 oz (60.4 kg).  General appearance: alert, cooperative, no distress and anxious Lungs: kyphosis, decreased breath sounds overall Chest; no tenderness, no redness, no blisters Heart: regular rate and rhythm Extremities: no edema Skin: warm and dry Neurologic: Grossly normal  EKG A- Paced  ASSESSMENT AND PLAN:   DOE (dyspnea on exertion) Pt complains of DOE- increased the last few weeks  Pacemaker - dual chamber, Medtronic- unipolar leads 1994 Last Gen change was June 2019  CRI (chronic renal insufficiency), stage 3 (moderate) (HCC) GFR 40's  Chronic diastolic heart failure (HCC) Echo March 2018- Systolic function was normal.   The estimated ejection fraction was in the range of 55% to 60%.   Wall motion was normal; there were no regional wall motion   abnormalities. Doppler parameters are consistent with both   elevated ventricular end-diastolic filling pressure and elevated   left atrial filling pressure.  Chest pain Pt complains of Lt chest wall "burning"   PLAN  Chest pain could be secondary to radiculopathy from T-spine DJD. Her DOE may be diastolic CHF.  Reviewed with Dr Royann Shivers who saw the patient with me. He would like to get another echo.  Corine Shelter PA-C 06/20/2018 2:33 PM

## 2018-06-20 NOTE — Assessment & Plan Note (Signed)
GFR 40's 

## 2018-06-20 NOTE — Assessment & Plan Note (Signed)
Pt complains of Lt chest wall "burning"

## 2018-06-22 NOTE — Progress Notes (Signed)
Thanks MCr 

## 2018-06-25 ENCOUNTER — Ambulatory Visit (HOSPITAL_COMMUNITY): Payer: Medicare HMO | Attending: Cardiology

## 2018-06-25 DIAGNOSIS — Z950 Presence of cardiac pacemaker: Secondary | ICD-10-CM | POA: Diagnosis not present

## 2018-06-25 DIAGNOSIS — N183 Chronic kidney disease, stage 3 (moderate): Secondary | ICD-10-CM | POA: Diagnosis not present

## 2018-06-25 DIAGNOSIS — I10 Essential (primary) hypertension: Secondary | ICD-10-CM | POA: Diagnosis not present

## 2018-06-25 DIAGNOSIS — I5032 Chronic diastolic (congestive) heart failure: Secondary | ICD-10-CM | POA: Diagnosis not present

## 2018-06-25 DIAGNOSIS — R079 Chest pain, unspecified: Secondary | ICD-10-CM | POA: Diagnosis not present

## 2018-06-25 DIAGNOSIS — R0609 Other forms of dyspnea: Secondary | ICD-10-CM | POA: Insufficient documentation

## 2018-06-26 ENCOUNTER — Other Ambulatory Visit: Payer: Self-pay

## 2018-06-26 ENCOUNTER — Telehealth: Payer: Self-pay | Admitting: Cardiovascular Disease

## 2018-06-26 DIAGNOSIS — I5032 Chronic diastolic (congestive) heart failure: Secondary | ICD-10-CM

## 2018-06-26 DIAGNOSIS — I10 Essential (primary) hypertension: Secondary | ICD-10-CM

## 2018-06-26 NOTE — Telephone Encounter (Signed)
New Message   Patient calling back about Echo results.

## 2018-06-26 NOTE — Telephone Encounter (Signed)
Spoke with pt dtr, Notes recorded by Thurmon Fair, MD on 06/26/2018 at 12:38 PM EST The Doppler parameters do suggest that she does have too much fluid on board (E/e'> 15). Kidney function is not too bad. Recommend increasing furosemide to 80 mg once daily and potassium chloride to 20 mEq daily with repeat BMET in about a week. Please ask her to keep a log of her weight and bring that to her follow-up appointment.  Aware of medications change and repeated. Lab orders mailed to the pt

## 2018-07-04 DIAGNOSIS — I10 Essential (primary) hypertension: Secondary | ICD-10-CM | POA: Diagnosis not present

## 2018-07-04 DIAGNOSIS — I5032 Chronic diastolic (congestive) heart failure: Secondary | ICD-10-CM | POA: Diagnosis not present

## 2018-07-05 LAB — BASIC METABOLIC PANEL
BUN/Creatinine Ratio: 20 (ref 12–28)
BUN: 25 mg/dL (ref 8–27)
CO2: 28 mmol/L (ref 20–29)
CREATININE: 1.24 mg/dL — AB (ref 0.57–1.00)
Calcium: 10.3 mg/dL (ref 8.7–10.3)
Chloride: 97 mmol/L (ref 96–106)
GFR calc Af Amer: 45 mL/min/{1.73_m2} — ABNORMAL LOW (ref >59)
GFR, EST NON AFRICAN AMERICAN: 39 mL/min/{1.73_m2} — AB (ref >59)
Glucose: 106 mg/dL — ABNORMAL HIGH (ref 65–99)
Potassium: 4.5 mmol/L (ref 3.5–5.2)
Sodium: 141 mmol/L (ref 134–144)

## 2018-07-24 DIAGNOSIS — H353212 Exudative age-related macular degeneration, right eye, with inactive choroidal neovascularization: Secondary | ICD-10-CM | POA: Diagnosis not present

## 2018-07-24 DIAGNOSIS — H35423 Microcystoid degeneration of retina, bilateral: Secondary | ICD-10-CM | POA: Diagnosis not present

## 2018-07-24 DIAGNOSIS — H43823 Vitreomacular adhesion, bilateral: Secondary | ICD-10-CM | POA: Diagnosis not present

## 2018-07-24 DIAGNOSIS — H353223 Exudative age-related macular degeneration, left eye, with inactive scar: Secondary | ICD-10-CM | POA: Diagnosis not present

## 2018-08-21 ENCOUNTER — Ambulatory Visit (INDEPENDENT_AMBULATORY_CARE_PROVIDER_SITE_OTHER): Payer: Medicare HMO | Admitting: *Deleted

## 2018-08-21 ENCOUNTER — Other Ambulatory Visit: Payer: Self-pay

## 2018-08-21 DIAGNOSIS — I495 Sick sinus syndrome: Secondary | ICD-10-CM | POA: Diagnosis not present

## 2018-08-21 LAB — CUP PACEART REMOTE DEVICE CHECK
Battery Remaining Longevity: 167 mo
Battery Voltage: 3.04 V
Brady Statistic AP VP Percent: 0.34 %
Brady Statistic AP VS Percent: 99.35 %
Brady Statistic AS VP Percent: 0 %
Brady Statistic AS VS Percent: 0.3 %
Brady Statistic RA Percent Paced: 100 %
Brady Statistic RV Percent Paced: 0.34 %
Date Time Interrogation Session: 20200409053251
Implantable Lead Implant Date: 19940815
Implantable Lead Implant Date: 19940815
Implantable Lead Location: 753859
Implantable Lead Location: 753860
Implantable Pulse Generator Implant Date: 20190606
Lead Channel Impedance Value: 323 Ohm
Lead Channel Impedance Value: 3401 Ohm
Lead Channel Impedance Value: 3401 Ohm
Lead Channel Impedance Value: 342 Ohm
Lead Channel Pacing Threshold Amplitude: 1 V
Lead Channel Pacing Threshold Pulse Width: 0.4 ms
Lead Channel Sensing Intrinsic Amplitude: 4.5 mV
Lead Channel Setting Pacing Amplitude: 2.5 V
Lead Channel Setting Pacing Amplitude: 2.5 V
Lead Channel Setting Pacing Pulse Width: 0.4 ms
Lead Channel Setting Sensing Sensitivity: 1.2 mV

## 2018-08-29 ENCOUNTER — Encounter: Payer: Self-pay | Admitting: Cardiology

## 2018-08-29 NOTE — Progress Notes (Signed)
Remote pacemaker transmission.   

## 2018-09-10 ENCOUNTER — Encounter: Payer: Medicare HMO | Admitting: Cardiovascular Disease

## 2018-09-15 ENCOUNTER — Other Ambulatory Visit: Payer: Self-pay

## 2018-09-15 MED ORDER — FUROSEMIDE 40 MG PO TABS: ORAL_TABLET | ORAL | 3 refills | Status: DC

## 2018-11-20 ENCOUNTER — Ambulatory Visit (INDEPENDENT_AMBULATORY_CARE_PROVIDER_SITE_OTHER): Payer: Medicare HMO | Admitting: *Deleted

## 2018-11-20 DIAGNOSIS — I495 Sick sinus syndrome: Secondary | ICD-10-CM

## 2018-11-20 DIAGNOSIS — I5032 Chronic diastolic (congestive) heart failure: Secondary | ICD-10-CM

## 2018-11-21 ENCOUNTER — Telehealth: Payer: Self-pay

## 2018-11-21 NOTE — Telephone Encounter (Signed)
Left message for patient to remind of missed remote transmission.  

## 2018-11-24 LAB — CUP PACEART REMOTE DEVICE CHECK
Battery Remaining Longevity: 164 mo
Battery Voltage: 3.02 V
Brady Statistic AP VP Percent: 0.39 %
Brady Statistic AP VS Percent: 99.18 %
Brady Statistic AS VP Percent: 0 %
Brady Statistic AS VS Percent: 0.43 %
Brady Statistic RA Percent Paced: 100 %
Brady Statistic RV Percent Paced: 0.39 %
Date Time Interrogation Session: 20200712045149
Implantable Lead Implant Date: 19940815
Implantable Lead Implant Date: 19940815
Implantable Lead Location: 753859
Implantable Lead Location: 753860
Implantable Pulse Generator Implant Date: 20190606
Lead Channel Impedance Value: 266 Ohm
Lead Channel Impedance Value: 3401 Ohm
Lead Channel Impedance Value: 3401 Ohm
Lead Channel Impedance Value: 380 Ohm
Lead Channel Pacing Threshold Amplitude: 1 V
Lead Channel Pacing Threshold Pulse Width: 0.4 ms
Lead Channel Sensing Intrinsic Amplitude: 5.375 mV
Lead Channel Sensing Intrinsic Amplitude: 5.375 mV
Lead Channel Setting Pacing Amplitude: 2.5 V
Lead Channel Setting Pacing Amplitude: 2.5 V
Lead Channel Setting Pacing Pulse Width: 0.4 ms
Lead Channel Setting Sensing Sensitivity: 1.2 mV

## 2018-11-26 NOTE — Progress Notes (Signed)
Remote pacemaker transmission.   

## 2018-12-01 DIAGNOSIS — H353223 Exudative age-related macular degeneration, left eye, with inactive scar: Secondary | ICD-10-CM | POA: Diagnosis not present

## 2018-12-03 ENCOUNTER — Other Ambulatory Visit: Payer: Self-pay | Admitting: Cardiovascular Disease

## 2018-12-03 DIAGNOSIS — I10 Essential (primary) hypertension: Secondary | ICD-10-CM

## 2019-02-20 ENCOUNTER — Encounter: Payer: Medicare HMO | Admitting: *Deleted

## 2019-03-06 ENCOUNTER — Other Ambulatory Visit: Payer: Self-pay | Admitting: Cardiovascular Disease

## 2019-03-18 ENCOUNTER — Telehealth: Payer: Self-pay

## 2019-03-18 ENCOUNTER — Ambulatory Visit (INDEPENDENT_AMBULATORY_CARE_PROVIDER_SITE_OTHER): Payer: Medicare HMO | Admitting: *Deleted

## 2019-03-18 DIAGNOSIS — I495 Sick sinus syndrome: Secondary | ICD-10-CM | POA: Diagnosis not present

## 2019-03-18 DIAGNOSIS — I5032 Chronic diastolic (congestive) heart failure: Secondary | ICD-10-CM

## 2019-03-18 LAB — CUP PACEART REMOTE DEVICE CHECK
Battery Remaining Longevity: 160 mo
Battery Voltage: 3.01 V
Brady Statistic AP VP Percent: 0.45 %
Brady Statistic AP VS Percent: 99.27 %
Brady Statistic AS VP Percent: 0 %
Brady Statistic AS VS Percent: 0.28 %
Brady Statistic RA Percent Paced: 100 %
Brady Statistic RV Percent Paced: 0.45 %
Date Time Interrogation Session: 20201104184209
Implantable Lead Implant Date: 19940815
Implantable Lead Implant Date: 19940815
Implantable Lead Location: 753859
Implantable Lead Location: 753860
Implantable Pulse Generator Implant Date: 20190606
Lead Channel Impedance Value: 247 Ohm
Lead Channel Impedance Value: 3401 Ohm
Lead Channel Impedance Value: 3401 Ohm
Lead Channel Impedance Value: 342 Ohm
Lead Channel Pacing Threshold Amplitude: 1.125 V
Lead Channel Pacing Threshold Pulse Width: 0.4 ms
Lead Channel Sensing Intrinsic Amplitude: 4.5 mV
Lead Channel Sensing Intrinsic Amplitude: 4.5 mV
Lead Channel Setting Pacing Amplitude: 2.5 V
Lead Channel Setting Pacing Amplitude: 3 V
Lead Channel Setting Pacing Pulse Width: 0.4 ms
Lead Channel Setting Sensing Sensitivity: 1.2 mV

## 2019-03-18 NOTE — Telephone Encounter (Signed)
LMOVM for pt daughter to give me a call back at my direct office number.

## 2019-03-18 NOTE — Telephone Encounter (Signed)
See phone note from 03/18/19--K. Kilgore, CMA, assisting pt's daughters with manual transmission.

## 2019-03-18 NOTE — Telephone Encounter (Signed)
Thanks

## 2019-03-18 NOTE — Telephone Encounter (Signed)
The pt daughter Jackelyn Poling called and states they can not send the transmission. She was at work and asked me to call her sister Rosann Auerbach to help with the monitor.   I talked to Rosann Auerbach to help send the manual transmission. The monitor gave her the error code 3230. I told her we will let the monitor charge for 30 minutes and try again. I told her I will call at 1:30 pm.

## 2019-03-18 NOTE — Telephone Encounter (Signed)
I help the pt send a manual transmission successfully.

## 2019-04-01 NOTE — Progress Notes (Signed)
Chief Complaint  Patient presents with  . Medicare Wellness    fasting AWV. Complains of increased SOB over the last 3 months, even just walking. Sees [name] and had echo. No appetite. Daughter reports that her sleepin has increased a lot. Her daughter thinks she is depressed. SHe has lost ex-husband (9/26), brother in law and sister in law all in the last 3 months. And brother juts recently put in nursing home.   . Flu Vaccine    declined.    [name] is a 85 y.o. female who presents for annual wellness visit and follow-up on chronic medical conditions.  She is accompanied by her daughter [name].  They report decreased appetite over the last year. She eats small amounts.  [name] fixes her food she will eat (soups), she eats healthy, just very small amounts.  Weight got down to 114#, 2 weeks ago, and hadn't been taking extra Lasix.  116# at home this morning.   She will snack on fruit cocktail, yogurt, applesauce, rice pudding.   Won't drink supplements (tried Boost, Ensure, Carnation).  Has some milkshakes.  Last year was noted to have suspected ganglion cyst left hand. She has noticed more (in other palm and on back of hand).  They are not painful.  Denies it affecting her daily activities or bothering her.  She is under the care of [name], last seen in his office in 06/2018 (by PA).  She has h/o PAT and SSS.  She has had pacemaker since 1994, 3 generator changes since with the last being in 10/2017.  She gets regular pacemaker checks, last was earlier this month. She has chronic diastolic failure.  She reported some increased SOB at last visit with cardiologist, Echo was repeated. Felt to have fluid overload. Lasix dose was increased to  and added KCl.  Did this for about 6 days, then cut back to  daily (and stopped potassium) after weights dropped.  They monitor weights regularly. RN daughter [name]) will increase to  lasix plus potassium for a few days if weight  increases, edema or increased shortness of breath. She has chronic renal insufficiency.  Last GFR 39. Lab Results  Component Value Date   CREATININE 1.24 (H) 07/04/2018   Echo 06/2018: IMPRESSIONS  1. The left ventricle has normal systolic function with an ejection fraction of 60-65%. The cavity size was normal. There is mild asymmetric left ventricular hypertrophy of the basal anteroseptal wall. Left ventricular diastolic Doppler parameters are  consistent with impaired relaxation Elevated mean left atrial pressure.  2. The right ventricle has normal systolic function. The cavity was normal. There is no increase in right ventricular wall thickness.  3. The mitral valve is normal in structure. There is moderate mitral annular calcification present.  4. The tricuspid valve is normal in structure.  5. The aortic valve has an indeterminant number of cusps There is mild thickening and mild calcification of the aortic valve, with mildly decreased cusp excursion.  6. The pulmonic valve was normal in structure.  7. Right atrial pressure is estimated at 3 mmHg.  Tobacco use: She smoked 1/2 PPD x 40 years, quit in 2014, but restarted a fewyears ago, now smoking 3-4/day, after eating. She still reports that she really doesn't have much else to enjoy, with her losing her hearing, vision, being unable to drive. One of her daughters smokes([name]), and they tend to smoke more when together--she visits for a week every 3 weeks [name] comes here).  Smokes heavier that week. [place], stays with [name] (nonsmoker).   There is no immunization history on file for this patient. "I haven't taken any vaccines in a long, long time"--she refuses them all.  Last Pap smear: s/p hysterectomy; last in 2011; refuses pelvic exams Last mammogram: 05/2018--had when was having burning/pain/swelling of the left breast.  Resolved with diuretic. Last colonoscopy: she  believes it was 2009, declines further Last DEXA: many years ago. Has been ordered/recommended/declined. She had L3 compression fracture in 2018 Dentist:--doesn't go, has upper and lower dentures Ophtho: had been getting injections every 14weeks; things had stabilized, no longer gets injections, just monitored.by  [name]  Vitamin D screen: normal level of 50 in 2013 Exercise: Walks some in the home (not 10 minutes straight). No longer goes outside. Doesn't use cane in house.  Has walker and cane, only when out.    Other doctors caring for patient include: Cardiolologist: [name] Ophtho: [name], [name] (retinal specialist) GYN:[name] (hasn't seen him in many years) GI: [name], last seen in 2009  Depression screen: negative  Fall Screen: negative PHQ-2 per nurse initially.  See below for PHQ-9 ADL screen--some trouble hearing/seeing. No longer does any stairs (fear of falling). Needs assistance in/out of shower. +urinary incontinence and now more fecal incontinence. Wears Depends. Seefull questionnaires in epic. She has 3 dedicated daughers who help out with medications, finances, household.  Mini-Cog screen: score of 2/5. recalled 2 words; unable to perform the clock drawing correctly due to vision impairment. Today she had a circle that wasn't completely closed.  All numbers were present, but didn't quite meet up into a circle.  Had hands on the clock.  Incorrect clock, but quite good for someone with significant visual impairment (central).  End of Life Discussion: Patient hasa living will and medical power of attorney  Past Medical History:  Diagnosis Date  . Chest wall pain 2003   atypical, now resolved  . CKD (chronic kidney disease), stage III    Cr 1.27, GFR 40 (07/2011)  . Diverticulitis    with DT flare in 6/09  . Gout    R hand  . Hypertension   . Hypertriglyceridemia   . Macular degeneration    [name]  . Pacemaker    sees Dr.  Recardo Atkinson [facility]) annually. Pacemaker replaced 2007 Tami Ribas)  . Sick sinus syndrome (HCC) with bradycardia  . Tobacco use disorder    quit 01/2013--restarted in 2017  . Urge incontinence     Past Surgical History:  Procedure Laterality Date  . ABDOMINAL HYSTERECTOMY  1978   partial  . APPENDECTOMY  1952  . CARDIAC CATHETERIZATION  2000  . CHOLECYSTECTOMY  1998   Dr. Harlow Asa  . COLON SURGERY  11/2009   vaginal-colon fistula repair (Drs. Danne Baxter)  . CYSTOSCOPY  2002   Dr. Reece Agar  . EYE SURGERY  11/2004   cataract surgery b/l - SherriBergen  . [place], 2000, 2007  . PPM GENERATOR CHANGEOUT N/A 10/17/2017   Procedure: PPM GENERATOR CHANGEOUT;  Surgeon: Sanda Klein, MD;  Location: [facility];  Service: Cardiovascular;  Laterality: N/A;  . TUBAL LIGATION  1960    Social History   Socioeconomic History  . Marital status: Divorced    Spouse name: Not on file  . Number of children: 4  . Years of education: Not on file  . Highest education level: Not  on file  Occupational History  . Occupation: retired Agricultural engineer  Social Needs  . Financial resource strain: Not on file  . Food insecurity    Worry: Not on file    Inability: Not on file  . Transportation needs    Medical: Not on file    Non-medical: Not on file  Tobacco Use  . Smoking status: Current Some Day Smoker    Packs/day: 0.50    Years: 40.00    Pack years: 20.00    Last attempt to quit: 01/12/2013    Years since quitting: 6.2  . Smokeless tobacco: Never Used  . Tobacco comment: smokes 1/2 PPD, quite 01/2013, restarted in 2017; smokes more when her daughter who smokes visits  Substance and Sexual Activity  . Alcohol use: No    Alcohol/week: 0.0 standard drinks  . Drug use: No  . Sexual activity: Not Currently  Lifestyle  . Physical activity    Days per week: Not on file    Minutes per session: Not on file  . Stress: Not on file  Relationships  . Social Musician  on phone: Not on file    Gets together: Not on file    Attends religious service: Not on file    Active member of club or organization: Not on file    Attends meetings of clubs or organizations: Not on file    Relationship status: Not on file  . Intimate partner violence    Fear of current or ex partner: Not on file    Emotionally abused: Not on file    Physically abused: Not on file    Forced sexual activity: Not on file  Other Topics Concern  . Not on file  Social History Narrative   Lives alone, no pets (cat died).  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 She has been offered to move to the [place] but she doesn't want to.   (She used to visits with the elderly at nursing homes)    Family History  Problem Relation Age of Onset  . Diabetes Mother   . Heart disease Mother   . Hyperlipidemia Mother   . Hypertension Mother   . Heart disease Father        pacemaker, CABG  . Hyperlipidemia Father   . Hypertension Father   . Cancer Father        prostate  . Cancer Sister        breast cancer 39's, then recurrence, metastatic, died at 33  . Diabetes Brother   . Alcohol abuse Brother   . Heart disease Brother   . Crohn's disease Daughter   . Alzheimer's disease Brother 20  . Cancer Brother 29       lymphoma  . Ulcerative colitis Daughter   . Alcohol abuse Daughter        >30 years sober 2019  . Heart disease Other 62  . Stroke Other 62  . Cancer Other        breast    Outpatient Encounter Medications as of 04/02/2019  Medication Sig Note  . aspirin 81 MG tablet Take 81 mg by mouth daily.   . calcium carbonate (OS-CAL) 600 MG TABS Take 600 mg by mouth daily. With Vitamin D   . furosemide (LASIX) 40 MG tablet Take 2 tablets ( 80 mg ) daily *NEEDS OFFICE VISIT FOR FURTHER REFILLS*   . metoprolol  tartrate (LOPRESSOR) 25 MG tablet TAKE 1 TABLET BY MOUTH TWICE A DAY 04/02/2019: 1/2 tab qam and 1 tab qpm  . Multiple  Vitamins-Minerals (OCUVITE PRESERVISION PO) Take 1 tablet by mouth daily.   . Omega-3 Fatty Acids (FISH OIL) 1000 MG CAPS Take 2 capsules by mouth daily.    Marland Kitchen acetaminophen (TYLENOL) 500 MG tablet Take 1,000 mg by mouth every 6 (six) hours as needed.    . potassium chloride (K-DUR) 10 MEQ tablet Take 2 tablets ( 20 meq ) daily 04/02/2019: prn   No facility-administered encounter medications on file as of 04/02/2019.     Allergies  Allergen Reactions  . Ciprofloxacin Hives and Other (See Comments)    Got hives after 10 days of both cipro and flagyl, not sure which caused the hives  . Flagyl [Metronidazole] Hives and Other (See Comments)    Got hives after 10 days of both cipro and flagyl, not sure which caused the hives  . Morphine And Related Nausea And Vomiting    ROS: The patient denies fever, headaches, ear pain, sore throat, breast concerns (resolved), chest pain, palpitations, dizziness, syncope, dyspnea on exertion (back to baseline), cough, swelling, nausea, vomiting, diarrhea, constipation, abdominal pain, melena, hematochezia, indigestion/heartburn, hematuria, dysuria, vaginal bleeding, discharge, odor or itch, genital lesions, joint pains, numbness, tingling, weakness, tremor, anxiety, abnormal bleeding/bruising, or enlarged lymph nodes. Denies any pain. Chronic hearing loss and vision loss Shortness of breath if she walks stairs or long distances or too quickly. Urinary incontinence, fecal incontinence (some urgency) She feels cold a lot (hands/feet), no recent change Some recent health changes/deaths in family, feeling down, decreased appetite   PHYSICAL EXAM:  BP 138/70   Pulse 80   Temp (!) 97.1 F (36.2 C) (Other (Comment))   Ht 5\' 4"  (1.626 m)   Wt 122 lb (55.3 kg)   BMI 20.94 kg/m   Wt Readings from Last 3 Encounters:  04/02/19 122 lb (55.3 kg)  06/20/18 133 lb 3.2 oz (60.4 kg)  03/31/18 133 lb 6.4 oz (60.5 kg)   Elderly female, in no distress. Hard of  hearing HEENT: conjunctiva and sclera are clear, EOMI. Wearing mask due to COVID-19 pandemic Neck: no lymphadenopathy, thyromegaly or mass Chest: Prominent pacemaker R upper chest, nontender Heart: regular rate and rhythm;  Murmur 2/6 at RUSB, slight radiation to carotids Lungs: distant, but clear. No wheezes, rales ronchi Abdomen: soft, nontender, no organomegaly or mass Back: no spinal or CVA tenderness Breasts: She declined to get undressed.  Performed in chair, sliding up bra. Fatty, soft breasts, without any masses or tenderness. No skin dimpling/puckering, nipple discharge or inversion, no axillary lymphadenopathy Pelvic and rectal were refused by pt Extremities: no edema. Palpable pulse, cool feet. Some soft tissue, nontender nodules (suspect ganglion cysts) on hands/wrist. Neuro: alert and oriented, EOMI.  Slow gait, using cane. Skin: hyperkeratotic/thickened patches on both arms ([name] reports is where she will scratch at herself). Thin skin Psych: Full range of affect.  Intermittently very down, looking down, sad, other times was brighter (in discussing the gospel shows she enjoys watching). Normal hygiene, grooming, eye contact, speech.  PHQ-9 score of 10   ASSESSMENT/PLAN:  Annual physical exam  Medicare annual wellness visit, subsequent  Vaccine counseling - counseled extensively re: flu shot, Tdap, pneumonia vaccines, Shingrix. Pushed for flu shot--refused all  Pacemaker - dual chamber, Medtronic- unipolar leads 1994 - functioning properly   PAT (paroxysmal atrial tachycardia) (HCC)  Mixed hyperlipidemia - Plan: Lipid panel  Chronic  diastolic heart failure (HCC) - stable currently, monitored closely with daily weights and adjustment of diuretics. Good EF  Sick sinus syndrome (HCC)  CRI (chronic renal insufficiency), stage 3 (moderate) - continue to avoid NSAIDs - Plan: Comprehensive metabolic panel  Tobacco abuse - counseled, encouraged cessation (and discussed  hopes for Sherri JunglingMarcia to quit to keep amt of smoking very low)  Bilateral hearing loss, unspecified hearing loss type  Essential hypertension, benign - Plan: Comprehensive metabolic panel, Lipid panel  Medication monitoring encounter - Plan: CBC with Differential, Comprehensive metabolic panel  Osteoporosis, unspecified osteoporosis type, unspecified pathological fracture presence - based on compression fx could start treatment without baseline DEXA. Refuses meds, DEXA. Discussed wt bearing exercise, Ca, D  Weight loss - encouraged adequate caloric intake, frequent snacks - Plan: TSH  Depression, major, single episode, moderate (HCC) - she refuses, counseling, meds.  Counseled in office and gave suggestions to daughter how she can help   Counseled re: depression.  Other than her decline in vision/hearing, and relying on others for assistance, and some heart issues, pointed out that she is pretty healthy--no dementia like her brother, good lungs, no arthritis or pain, no diabetes.  The fact that she is [id number] and pain-free--discussed appreciating what she has, rather than focusing on what she misses.  Discussed keeping gratitude journal, thinking of things that make her smile daily.  Encouraged daughter to help with these things, since she refuses to see a therapist or take meds.  Declines osteoporosis treatment or DEXA. Discussed weight bearing exercise and how to do safely at home. Discussed Ca, D. Discussed all vaccines, specifically pushed the high dose flu shot given COVID--she refuses.  All of the family has had flu shots (the only people she sees).  MOST form reviewed and updated, signed by NauruDebbie. Full Code.  Doesn't want prolonged measures, has living will/advanced directives.    Discussed monthly self breast exams, had mammogram recently due to scare (pain); at least 30 minutes of aerobic activity at least 5 days/week, weight bearing exercise 2x/week; proper sunscreen use reviewed; healthy  diet, including goals of calcium and vitamin D intake and alcohol recommendations (less than or equal to 1 drink/day) reviewed; regular seatbelt use; changing batteries in smoke detectors. Immunization recommendations discussed as above--declines all. Colonoscopy recommendations reviewed, not recommended at this point due to age, patient preference/declines. Counseled re: smoking.  Total face to face time over 60 minutes. Significant counseling re: smoking, osteoporosis, vaccines, depression, weight loss and diet, above and beyond normal routine health maintenance.   Medicare Attestation I have personally reviewed: The patient's medical and social history Their use of alcohol, tobacco or illicit drugs Their current medications and supplements The patient's functional ability including ADLs,fall risks, home safety risks, cognitive, and hearing and visual impairment Diet and physical activities Evidence for depression or mood disorders  The patient's weight, height, BMI have been recorded in the chart.  I have made referrals, counseling, and provided education to the patient based on review of the above and I have provided the patient with a written personalized care plan for preventive services.

## 2019-04-01 NOTE — Patient Instructions (Signed)
  HEALTH MAINTENANCE RECOMMENDATIONS:  It is recommended that you get at least 30 minutes of aerobic exercise at least 5 days/week (for weight loss, you may need as much as 60-90 minutes). This can be any activity that gets your heart rate up. This can be divided in 10-15 minute intervals if needed, but try and build up your endurance at least once a week.  Weight bearing exercise is also recommended twice weekly.  Eat a healthy diet with lots of vegetables, fruits and fiber.  "Colorful" foods have a lot of vitamins (ie green vegetables, tomatoes, red peppers, etc).  Limit sweet tea, regular sodas and alcoholic beverages, all of which has a lot of calories and sugar.  Up to 1 alcoholic drink daily may be beneficial for women (unless trying to lose weight, watch sugars).  Drink a lot of water.  Calcium recommendations are 1200-1500 mg daily (1500 mg for postmenopausal women or women without ovaries), and vitamin D 1000 IU daily.  This should be obtained from diet and/or supplements (vitamins), and calcium should not be taken all at once, but in divided doses.  Monthly self breast exams and yearly mammograms for women over the age of 67 is recommended.  Sunscreen of at least SPF 30 should be used on all sun-exposed parts of the skin when outside between the hours of 10 am and 4 pm (not just when at beach or pool, but even with exercise, golf, tennis, and yard work!)  Use a sunscreen that says "broad spectrum" so it covers both UVA and UVB rays, and make sure to reapply every 1-2 hours.  Remember to change the batteries in your smoke detectors when changing your clock times in the spring and fall. Carbon monoxide detectors are recommended for your home.  Use your seat belt every time you are in a car, and please drive safely and not be distracted with cell phones and texting while driving.   [name] , Thank you for taking time to come for your Medicare Wellness Visit. I appreciate your ongoing  commitment to your health goals. Please review the following plan we discussed and let me know if I can assist you in the future.   This is a list of the screening recommended for you and due dates:  Health Maintenance  Topic Date Due  . Tetanus Vaccine  12/15/1946  . Pneumonia vaccines (1 of 2 - PCV13) 12/16/1994  . Flu Shot  12/13/2018  . DEXA scan (bone density measurement)  Completed   These vaccines (flu shot, tetanus (TdaP) and pneumonia shots) are all highly recommended, as we discussed in detail, as well as the Shingles vaccine. We can give flu shot and pneumonia vaccines (2 different types, given a year apart) in the office.  The TdaP and Shingrix are gotten at the pharmacy (covered by Medicare Part D). Shingrix is a series of 2 injections, spaced 2 months apart.  We discussed trying to get weight-bearing exercise at least 2-3 times/week, and how to safely do this, as well as getting some aerobic exercise in a safe manner.

## 2019-04-02 ENCOUNTER — Encounter: Payer: Self-pay | Admitting: Family Medicine

## 2019-04-02 ENCOUNTER — Ambulatory Visit (INDEPENDENT_AMBULATORY_CARE_PROVIDER_SITE_OTHER): Payer: Medicare HMO | Admitting: Family Medicine

## 2019-04-02 ENCOUNTER — Other Ambulatory Visit: Payer: Self-pay

## 2019-04-02 VITALS — BP 138/70 | HR 80 | Temp 97.1°F | Ht 64.0 in | Wt 122.0 lb

## 2019-04-02 DIAGNOSIS — N183 Chronic kidney disease, stage 3 unspecified: Secondary | ICD-10-CM | POA: Diagnosis not present

## 2019-04-02 DIAGNOSIS — I471 Supraventricular tachycardia: Secondary | ICD-10-CM | POA: Diagnosis not present

## 2019-04-02 DIAGNOSIS — Z7189 Other specified counseling: Secondary | ICD-10-CM

## 2019-04-02 DIAGNOSIS — Z720 Tobacco use: Secondary | ICD-10-CM | POA: Diagnosis not present

## 2019-04-02 DIAGNOSIS — I10 Essential (primary) hypertension: Secondary | ICD-10-CM | POA: Diagnosis not present

## 2019-04-02 DIAGNOSIS — I495 Sick sinus syndrome: Secondary | ICD-10-CM

## 2019-04-02 DIAGNOSIS — Z7185 Encounter for immunization safety counseling: Secondary | ICD-10-CM

## 2019-04-02 DIAGNOSIS — I4719 Other supraventricular tachycardia: Secondary | ICD-10-CM

## 2019-04-02 DIAGNOSIS — R634 Abnormal weight loss: Secondary | ICD-10-CM | POA: Diagnosis not present

## 2019-04-02 DIAGNOSIS — Z5181 Encounter for therapeutic drug level monitoring: Secondary | ICD-10-CM | POA: Diagnosis not present

## 2019-04-02 DIAGNOSIS — E782 Mixed hyperlipidemia: Secondary | ICD-10-CM

## 2019-04-02 DIAGNOSIS — M810 Age-related osteoporosis without current pathological fracture: Secondary | ICD-10-CM

## 2019-04-02 DIAGNOSIS — F321 Major depressive disorder, single episode, moderate: Secondary | ICD-10-CM

## 2019-04-02 DIAGNOSIS — H9193 Unspecified hearing loss, bilateral: Secondary | ICD-10-CM

## 2019-04-02 DIAGNOSIS — I5032 Chronic diastolic (congestive) heart failure: Secondary | ICD-10-CM | POA: Diagnosis not present

## 2019-04-02 DIAGNOSIS — Z950 Presence of cardiac pacemaker: Secondary | ICD-10-CM

## 2019-04-02 DIAGNOSIS — Z0000 Encounter for general adult medical examination without abnormal findings: Secondary | ICD-10-CM | POA: Diagnosis not present

## 2019-04-03 LAB — CBC WITH DIFFERENTIAL/PLATELET
Basophils Absolute: 0.1 10*3/uL (ref 0.0–0.2)
Basos: 1 %
EOS (ABSOLUTE): 0.1 10*3/uL (ref 0.0–0.4)
Eos: 1 %
Hematocrit: 43.4 % (ref 34.0–46.6)
Hemoglobin: 14.4 g/dL (ref 11.1–15.9)
Immature Grans (Abs): 0 10*3/uL (ref 0.0–0.1)
Immature Granulocytes: 0 %
Lymphocytes Absolute: 2.4 10*3/uL (ref 0.7–3.1)
Lymphs: 25 %
MCH: 31.3 pg (ref 26.6–33.0)
MCHC: 33.2 g/dL (ref 31.5–35.7)
MCV: 94 fL (ref 79–97)
Monocytes Absolute: 0.7 10*3/uL (ref 0.1–0.9)
Monocytes: 8 %
Neutrophils Absolute: 6.4 10*3/uL (ref 1.4–7.0)
Neutrophils: 65 %
Platelets: 195 10*3/uL (ref 150–450)
RBC: 4.6 x10E6/uL (ref 3.77–5.28)
RDW: 12.5 % (ref 11.7–15.4)
WBC: 9.7 10*3/uL (ref 3.4–10.8)

## 2019-04-03 LAB — COMPREHENSIVE METABOLIC PANEL
ALT: 3 IU/L (ref 0–32)
AST: 16 IU/L (ref 0–40)
Albumin/Globulin Ratio: 1.6 (ref 1.2–2.2)
Albumin: 4.1 g/dL (ref 3.6–4.6)
Alkaline Phosphatase: 53 IU/L (ref 39–117)
BUN/Creatinine Ratio: 19 (ref 12–28)
BUN: 23 mg/dL (ref 8–27)
Bilirubin Total: 0.5 mg/dL (ref 0.0–1.2)
CO2: 27 mmol/L (ref 20–29)
Calcium: 10.5 mg/dL — ABNORMAL HIGH (ref 8.7–10.3)
Chloride: 98 mmol/L (ref 96–106)
Creatinine, Ser: 1.23 mg/dL — ABNORMAL HIGH (ref 0.57–1.00)
GFR calc Af Amer: 45 mL/min/{1.73_m2} — ABNORMAL LOW (ref >59)
GFR calc non Af Amer: 39 mL/min/{1.73_m2} — ABNORMAL LOW (ref >59)
Globulin, Total: 2.6 g/dL (ref 1.5–4.5)
Glucose: 88 mg/dL (ref 65–99)
Potassium: 4.5 mmol/L (ref 3.5–5.2)
Sodium: 144 mmol/L (ref 134–144)
Total Protein: 6.7 g/dL (ref 6.0–8.5)

## 2019-04-03 LAB — LIPID PANEL
Chol/HDL Ratio: 3.5 ratio (ref 0.0–4.4)
Cholesterol, Total: 181 mg/dL (ref 100–199)
HDL: 52 mg/dL (ref >39)
LDL Chol Calc (NIH): 102 mg/dL — ABNORMAL HIGH (ref 0–99)
Triglycerides: 158 mg/dL — ABNORMAL HIGH (ref 0–149)
VLDL Cholesterol Cal: 27 mg/dL (ref 5–40)

## 2019-04-03 LAB — TSH: TSH: 2.86 u[IU]/mL (ref 0.450–4.500)

## 2019-04-04 ENCOUNTER — Encounter: Payer: Self-pay | Admitting: Family Medicine

## 2019-04-05 NOTE — Progress Notes (Signed)
Remote pacemaker transmission.   

## 2019-04-07 ENCOUNTER — Other Ambulatory Visit: Payer: Self-pay | Admitting: Cardiovascular Disease

## 2019-04-07 NOTE — Telephone Encounter (Signed)
Rx request sent to pharmacy.  

## 2019-04-10 ENCOUNTER — Other Ambulatory Visit: Payer: Self-pay | Admitting: Cardiovascular Disease

## 2019-04-28 ENCOUNTER — Other Ambulatory Visit: Payer: Self-pay

## 2019-04-28 ENCOUNTER — Telehealth: Payer: Self-pay

## 2019-04-28 ENCOUNTER — Other Ambulatory Visit (INDEPENDENT_AMBULATORY_CARE_PROVIDER_SITE_OTHER): Payer: Medicare HMO

## 2019-04-28 ENCOUNTER — Telehealth: Payer: Self-pay | Admitting: Family Medicine

## 2019-04-28 DIAGNOSIS — R399 Unspecified symptoms and signs involving the genitourinary system: Secondary | ICD-10-CM | POA: Diagnosis not present

## 2019-04-28 NOTE — Telephone Encounter (Signed)
Ok will do, [name] just picked up the specimen cup and wipes

## 2019-04-28 NOTE — Telephone Encounter (Signed)
Pts daughter Jackelyn Poling called and said pt may have a UTI, her urine has strong odor. Pt is unable to come to the office. So [name] wanted to see if she could come back and pick up a specimen cup and bring the urine back.

## 2019-04-28 NOTE — Telephone Encounter (Signed)
Not sure when she plans to bring in the urine.  She will need orders so they know what to do with it.  If bringing it today, can send it off for urine dip with reflex to culture if abnormal.  If bringing tomorrow (when I'll be in office), can do point of care urine dip.  I'm cc-ing to clinical staff so they are aware and can enter the order (please notify them if/when daughter brings in urine).  Thanks

## 2019-04-28 NOTE — Telephone Encounter (Signed)
Pt urine is in the computer please advise if we need to send it out for a culture. [place]

## 2019-04-28 NOTE — Telephone Encounter (Signed)
She can pick up specimen cup and wipes,  and return same day.  Would do a virtual visit (video or phone, whichever is easiest for them) once we have the specimen.  Strong odor can indicate that urine is concentrate, so make sure she is drinking enough fluids.

## 2019-04-28 NOTE — Telephone Encounter (Signed)
Her daughter just called she is on the way to bring the urine back

## 2019-04-28 NOTE — Telephone Encounter (Signed)
Pt advised. Her daughter she they have been pushing fluids

## 2019-04-28 NOTE — Telephone Encounter (Signed)
I had asked for it to be sent to [facility] if it was being done today (since I wasn't here) for urinalysis and reflex to culture if abnormal.  Since that wasn't done, I'd be happy to let you know if a culture is needed, but I DO NOT SEE IT RESULTED. It still has it as active.  I can't see the result to make this decision.

## 2019-04-29 ENCOUNTER — Encounter: Payer: Self-pay | Admitting: Family Medicine

## 2019-04-29 ENCOUNTER — Ambulatory Visit (INDEPENDENT_AMBULATORY_CARE_PROVIDER_SITE_OTHER): Payer: Medicare HMO | Admitting: Family Medicine

## 2019-04-29 VITALS — BP 120/64 | HR 70 | Temp 97.3°F | Ht 64.0 in | Wt 120.0 lb

## 2019-04-29 DIAGNOSIS — N3000 Acute cystitis without hematuria: Secondary | ICD-10-CM | POA: Diagnosis not present

## 2019-04-29 DIAGNOSIS — R399 Unspecified symptoms and signs involving the genitourinary system: Secondary | ICD-10-CM | POA: Diagnosis not present

## 2019-04-29 LAB — POCT URINALYSIS DIP (PROADVANTAGE DEVICE)
Bilirubin, UA: NEGATIVE
Blood, UA: NEGATIVE
Glucose, UA: NEGATIVE mg/dL
Ketones, POC UA: NEGATIVE mg/dL
Nitrite, UA: POSITIVE — AB
Protein Ur, POC: NEGATIVE mg/dL
Specific Gravity, Urine: 1.01
Urobilinogen, Ur: NEGATIVE
pH, UA: 6 (ref 5.0–8.0)

## 2019-04-29 MED ORDER — NITROFURANTOIN MONOHYD MACRO 100 MG PO CAPS: 100.0000 mg | ORAL_CAPSULE | Freq: Two times a day (BID) | ORAL | 0 refills | Status: DC

## 2019-04-29 NOTE — Addendum Note (Signed)
Addended by: Minette Headland A on: 04/29/2019 09:08 AM   Modules accepted: Orders

## 2019-04-29 NOTE — Progress Notes (Signed)
Start time: 3:30 End time: 3:41  Virtual Visit via Video Note  I connected with [name] on 04/29/19 at  3:15 PM EST by a video enabled telemedicine application and verified that I am speaking with the correct person using two identifiers.  Location: Patient: at home with daughters [name] and [name] Provider: office   I discussed the limitations of evaluation and management by telemedicine and the availability of in person appointments. The patient expressed understanding and agreed to proceed.  History of Present Illness:  Chief Complaint  Patient presents with  . Odor in urine    VIRTUAL started with odor in her urine yesterday. Today she has some LBP and some dizziness (that has been going on since L Friday)- EMS was called at [facility] 12/11.    Her daughter dropped off a urine specimen yesterday due to odor to urine.  She is complaining of back pain, feels lightheaded.  No burning with urination, but "doesn't feel right". Stomach feels bloated after voiding. Bowels are normal, no constipation or diarrhea. Back pain is the middle part of her lower part of her back.  Denies flank pain.  No abdominal pain.  Urine is cloudy, daughter noted odor.  No blood in the urine.  She has h/o UTI (not for about 3 years), and reports this feels similar.  PMH, PSH, SH reviewed.  Outpatient Encounter Medications as of 04/29/2019  Medication Sig Note  . acetaminophen (TYLENOL) 500 MG tablet Take 1,000 mg by mouth every 6 (six) hours as needed.  04/29/2019: Last dose today at 10:30am  . aspirin 81 MG tablet Take 81 mg by mouth daily.   . calcium carbonate (OS-CAL) 600 MG TABS Take 600 mg by mouth daily. With Vitamin D   . furosemide (LASIX) 40 MG tablet TAKE 2 TABLETS ( 80 MG ) DAILY *NEEDS OFFICE VISIT FOR FURTHER REFILLS* 04/29/2019: 40mg  daily  . metoprolol tartrate (LOPRESSOR) 25 MG tablet TAKE 1 TABLET BY MOUTH TWICE A DAY 04/02/2019: 1/2 tab qam and 1 tab qpm  . Multiple  Vitamins-Minerals (OCUVITE PRESERVISION PO) Take 1 tablet by mouth daily.   . Omega-3 Fatty Acids (FISH OIL) 1000 MG CAPS Take 2 capsules by mouth daily.    . potassium chloride (K-DUR) 10 MEQ tablet Take 2 tablets ( 20 meq ) daily 04/02/2019: prn   No facility-administered encounter medications on file as of 04/29/2019.   Allergies  Allergen Reactions  . Ciprofloxacin Hives and Other (See Comments)    Got hives after 10 days of both cipro and flagyl, not sure which caused the hives  . Flagyl [Metronidazole] Hives and Other (See Comments)    Got hives after 10 days of both cipro and flagyl, not sure which caused the hives  . Morphine And Related Nausea And Vomiting   ROS:  Urinary complaints as per HPI. Denies nausea or vomiting, normal appetite. No fever or chills Drinking water helps with dizziness. No cough, URI symptoms. No chest pain or trouble breathing   Observations/Objective: BP 120/64   Pulse 70   Temp (!) 97.3 F (36.3 C) (Oral)   Ht 5\' 4"  (1.626 m)   Wt 120 lb (54.4 kg)   BMI 20.60 kg/m   Alert, oriented, pleasant, well-appearing female.  Hard of hearing, and [name] repeats things to her.   She appears comfortable, in no distress. She is speaking easily.  Normal eye contact, grooming. Exam is limited due to virtual nature of the visit.  Urine dip from yesterday  showed +nitrates and trace leuks. SG 1.010. Urine was cloudy (and noted by staff to have odor). Urine culture is pending.    Assessment and Plan:  Acute cystitis without hematuria - will treat with macrobid while awaiting culture. To stay well hydrated. Will advise of culture results and any med changes, if needed, through Mychart - Plan: nitrofurantoin, macrocrystal-monohydrate, (MACROBID) 100 MG capsule  Daughter [name] will be checking her MyChart to look for urine culture results and any possible medication changes. They will contact us if any symptoms change.   Follow Up Instructions:    I  discussed the assessment and treatment plan with the patient. The patient was provided an opportunity to ask questions and all were answered. The patient agreed with the plan and demonstrated an understanding of the instructions.   The patient was advised to call back or seek an in-person evaluation if the symptoms worsen or if the condition fails to improve as anticipated.  I provided 11 minutes of non-face-to-face time during this encounter.   Vikki Ports, MD

## 2019-04-29 NOTE — Telephone Encounter (Signed)
She needs a virtual visit today (can be phone call if that is all she can do; I believe I have done video, when daughter was with her)

## 2019-04-29 NOTE — Patient Instructions (Signed)
Drink plenty of water. Start the antibiotics and take twice daily for 7 days. Look out for messages on MyChart with the culture results. You will see the results as soon as they come in, possibly before I get to review them.  Be patient, and I will send comments and recommendations once I review them.  Send a message or all with any concerns.   Urinary Tract Infection, Adult A urinary tract infection (UTI) is an infection of any part of the urinary tract. The urinary tract includes:  The kidneys.  The ureters.  The bladder.  The urethra. These organs make, store, and get rid of pee (urine) in the body. What are the causes? This is caused by germs (bacteria) in your genital area. These germs grow and cause swelling (inflammation) of your urinary tract. What increases the risk? You are more likely to develop this condition if:  You have a small, thin tube (catheter) to drain pee.  You cannot control when you pee or poop (incontinence).  You are female, and: ? You use these methods to prevent pregnancy: ? A medicine that kills sperm (spermicide). ? A device that blocks sperm (diaphragm). ? You have low levels of a female hormone (estrogen). ? You are pregnant.  You have genes that add to your risk.  You are sexually active.  You take antibiotic medicines.  You have trouble peeing because of: ? A prostate that is bigger than normal, if you are female. ? A blockage in the part of your body that drains pee from the bladder (urethra). ? A kidney stone. ? A nerve condition that affects your bladder (neurogenic bladder). ? Not getting enough to drink. ? Not peeing often enough.  You have other conditions, such as: ? Diabetes. ? A weak disease-fighting system (immune system). ? Sickle cell disease. ? Gout. ? Injury of the spine. What are the signs or symptoms? Symptoms of this condition include:  Needing to pee right away (urgently).  Peeing often.  Peeing small  amounts often.  Pain or burning when peeing.  Blood in the pee.  Pee that smells bad or not like normal.  Trouble peeing.  Pee that is cloudy.  Fluid coming from the vagina, if you are female.  Pain in the belly or lower back. Other symptoms include:  Throwing up (vomiting).  No urge to eat.  Feeling mixed up (confused).  Being tired and grouchy (irritable).  A fever.  Watery poop (diarrhea). How is this treated? This condition may be treated with:  Antibiotic medicine.  Other medicines.  Drinking enough water. Follow these instructions at home:  Medicines  Take over-the-counter and prescription medicines only as told by your doctor.  If you were prescribed an antibiotic medicine, take it as told by your doctor. Do not stop taking it even if you start to feel better. General instructions  Make sure you: ? Pee until your bladder is empty. ? Do not hold pee for a long time. ? Empty your bladder after sex. ? Wipe from front to back after pooping if you are a female. Use each tissue one time when you wipe.  Drink enough fluid to keep your pee pale yellow.  Keep all follow-up visits as told by your doctor. This is important. Contact a doctor if:  You do not get better after 1-2 days.  Your symptoms go away and then come back. Get help right away if:  You have very bad back pain.  You have very  bad pain in your lower belly.  You have a fever.  You are sick to your stomach (nauseous).  You are throwing up. Summary  A urinary tract infection (UTI) is an infection of any part of the urinary tract.  This condition is caused by germs in your genital area.  There are many risk factors for a UTI. These include having a small, thin tube to drain pee and not being able to control when you pee or poop.  Treatment includes antibiotic medicines for germs.  Drink enough fluid to keep your pee pale yellow. This information is not intended to replace advice  given to you by your health care provider. Make sure you discuss any questions you have with your health care provider. Document Released: 10/17/2007 Document Revised: 04/17/2018 Document Reviewed: 11/07/2017 Elsevier Patient Education  2020 [employer].

## 2019-04-29 NOTE — Telephone Encounter (Signed)
I have sent you the results and I have put urine culture in

## 2019-05-04 ENCOUNTER — Encounter: Payer: Self-pay | Admitting: Family Medicine

## 2019-05-04 ENCOUNTER — Other Ambulatory Visit: Payer: Self-pay

## 2019-05-04 ENCOUNTER — Ambulatory Visit (INDEPENDENT_AMBULATORY_CARE_PROVIDER_SITE_OTHER): Payer: Medicare HMO | Admitting: Family Medicine

## 2019-05-04 VITALS — BP 140/70 | HR 72 | Temp 96.9°F | Ht 64.0 in | Wt 123.2 lb

## 2019-05-04 DIAGNOSIS — R42 Dizziness and giddiness: Secondary | ICD-10-CM

## 2019-05-04 DIAGNOSIS — N3000 Acute cystitis without hematuria: Secondary | ICD-10-CM

## 2019-05-04 LAB — URINE CULTURE

## 2019-05-04 MED ORDER — SULFAMETHOXAZOLE-TRIMETHOPRIM 800-160 MG PO TABS: 1.0000 | ORAL_TABLET | Freq: Two times a day (BID) | ORAL | 0 refills | Status: DC

## 2019-05-04 NOTE — Patient Instructions (Addendum)
  I recommend taking loratidine (claritin 10mg ) once daily. This may help with any allergies/congestion, which may contribute to the dizziness. When you sit up in the morning, try and keep your head looking straight forward--you seem to be much dizzier when your head is turned to either side. You may use low dose meclizine (12.5mg ) if needed for severe vertigo.  This may make you a little sleepy. If dizziness is not improving, and if it still seems to be with certain head positions, we can refer you for physical therapy.  Take the septra/bactrim twice daily for a week to treat the urinary tract infection.  The previous antibiotic is not working. STOP the nitrofurantoin.

## 2019-05-04 NOTE — Progress Notes (Signed)
Chief Complaint  Patient presents with  . Dizziness    and weakness that started [place] 04[place]14[place]2019 that has not gotten any better.     She started with vertigo 12/3 when at the beach.  EMS was called, told her she was dehydrated. She feels dizzy mostly in the mornings.  Denies lightheadeness, things are "whirling". Brought on by turning head quickly, any direction. Denies any nausea/vomiting. She reports that her left ear is stopped up. Denies ear pain, or other URI symptoms. She is using her walker more regularly, denies any falls.  She was recently treated for UTI, put on macrobid while culture is pending.  At the beginning of visit, report was still preliminary, >100K GNR. At tne end of visit, results were final, showing Klebsiella, and was resistant to nitrofurantoin. Patient and Jackelyn Poling state the urine odor has improved and patient feels like her bladder has improved some, still has some pressure (and leakage)  Had feet swelling--increased lasix to 3/d for 3 days (on the last day now), along with taking potassium, per cardiologist.  They report that the swelling went down  PMH, PSH, [place] reviewed  Outpatient Encounter Medications as of 05/04/2019  Medication Sig Note  . aspirin 81 MG tablet Take 81 mg by mouth daily.   . calcium carbonate (OS-CAL) 600 MG TABS Take 600 mg by mouth daily. With Vitamin D   . furosemide (LASIX) 40 MG tablet TAKE 2 TABLETS ( 80 MG ) DAILY *NEEDS OFFICE VISIT FOR FURTHER REFILLS* 05/04/2019: 60mg   . metoprolol tartrate (LOPRESSOR) 25 MG tablet TAKE 1 TABLET BY MOUTH TWICE A DAY 04/02/2019: 1/2 tab qam and 1 tab qpm  . Multiple Vitamins-Minerals (OCUVITE PRESERVISION PO) Take 1 tablet by mouth daily.   . nitrofurantoin, macrocrystal-monohydrate, (MACROBID) 100 MG capsule Take 1 capsule (100 mg total) by mouth 2 (two) times daily.   . Omega-3 Fatty Acids (FISH OIL) 1000 MG CAPS Take 2 capsules by mouth daily.    . potassium  chloride (K-DUR) 10 MEQ tablet Take 2 tablets ( 20 meq ) daily 04/02/2019: prn  . acetaminophen (TYLENOL) 500 MG tablet Take 1,000 mg by mouth every 6 (six) hours as needed.     No facility-administered encounter medications on file as of 05/04/2019.    Allergies  Allergen Reactions  . Ciprofloxacin Hives and Other (See Comments)    Got hives after 10 days of both cipro and flagyl, not sure which caused the hives  . Flagyl [Metronidazole] Hives and Other (See Comments)    Got hives after 10 days of both cipro and flagyl, not sure which caused the hives  . Morphine And Related Nausea And Vomiting   ROS: no fever, chills, nausea, vomiting, URI symptoms.  +left ear plugging and vertigo per HPI.  +incontinence, but no dysuria or hematuria.  No chest pain.  SOB/breathing is back to baseline.   PHYSICAL EXAM:  BP 120/60   Pulse 72   Temp (!) 96.9 F (36.1 C) (Other (Comment))   Ht 5\' 4"  (1.626 m)   Wt 123 lb 3.2 oz (55.9 kg)   BMI 21.15 kg/m   Orthostatic VS done, not orthostatic (see flowsheet).  Elderly female, accompanied by her daughter Jackelyn Poling. She appears comfortable when at rest and is still. She was noted to be intermittently breathing a little fast/heavy--especially when she was dizzy, which resolved as the dizziness stopped, also some with exertion.  Neuro: alert and oriented.  EOMI. She was  noted to get dizzy when going from laying to sitting with head turned to either side, but worse when to the left.  Did not have any vertigo develop when she sat up with her head looking straight forward.  No nystagmus was noted. Normal finger to nose. Gait not assessed (briefly saw it was slow, with walker). Normal strength, symmetric HEENT: conjunctiva and sclera are clear, EOMI. Nasal mucosa is mildly edematous, clear mucus present. Sinuses are nontender TM's and EAC's are normal bilaterally. Neck: no lymphadenopathy or mass Heart: regular rate and rhythm Lungs: clear Back: no CVA  tenderness Abdomen: she describes a pressure in suprapubic area.  Not really tender.  Felt very firm (on exam through clothes--[name] states she has at least 2 pads on, and that's what I'm feeling). Otherwise abdomen is soft, nontender, no mass Extremities: no edema   ASSESSMENT/PLAN:  Vertigo - may have positional component, also allergies. Claritin daily, 12.5 meclizine prn. Avoid sitting up with head turned.  Refer to PT if not improving  Acute cystitis without hematuria - culture shows resistance to the prescribed antibiotic.  Change to Septra DS BID - Plan: sulfamethoxazole-trimethoprim (BACTRIM DS) 800-160 MG tablet  Follow-up if symptoms persist or worsen (may need labs done too, if not improving).   I recommend taking loratidine (claritin 10mg ) once daily. This may help with any allergies/congestion, which may contribute to the dizziness. When you sit up in the morning, try and keep your head looking straight forward--you seem to be much dizzier when your head is turned to either side. You may use low dose meclizine (12.5mg ) if needed for severe vertigo.  This may make you a little sleepy. If dizziness is not improving, and if it still seems to be with certain head positions, we can refer you for physical therapy.  Take the septra/bactrim twice daily for a week to treat the urinary tract infection.  The previous antibiotic is not working. STOP the nitrofurantoin.

## 2019-05-10 ENCOUNTER — Other Ambulatory Visit: Payer: Self-pay

## 2019-05-10 ENCOUNTER — Encounter (HOSPITAL_COMMUNITY): Payer: Self-pay

## 2019-05-10 ENCOUNTER — Inpatient Hospital Stay (HOSPITAL_COMMUNITY)
Admission: EM | Admit: 2019-05-10 | Discharge: 2019-05-14 | DRG: 871 | Disposition: A | Discharge status: Hospice | Payer: Medicare HMO | Attending: Internal Medicine | Admitting: Internal Medicine

## 2019-05-10 ENCOUNTER — Encounter: Payer: Self-pay | Admitting: Family Medicine

## 2019-05-10 ENCOUNTER — Emergency Department (HOSPITAL_COMMUNITY): Payer: Medicare HMO

## 2019-05-10 DIAGNOSIS — J449 Chronic obstructive pulmonary disease, unspecified: Secondary | ICD-10-CM | POA: Diagnosis present

## 2019-05-10 DIAGNOSIS — R570 Cardiogenic shock: Secondary | ICD-10-CM | POA: Diagnosis not present

## 2019-05-10 DIAGNOSIS — R0902 Hypoxemia: Secondary | ICD-10-CM | POA: Diagnosis not present

## 2019-05-10 DIAGNOSIS — Z950 Presence of cardiac pacemaker: Secondary | ICD-10-CM

## 2019-05-10 DIAGNOSIS — I4891 Unspecified atrial fibrillation: Secondary | ICD-10-CM | POA: Diagnosis not present

## 2019-05-10 DIAGNOSIS — N179 Acute kidney failure, unspecified: Secondary | ICD-10-CM | POA: Diagnosis present

## 2019-05-10 DIAGNOSIS — I5032 Chronic diastolic (congestive) heart failure: Secondary | ICD-10-CM | POA: Diagnosis not present

## 2019-05-10 DIAGNOSIS — R778 Other specified abnormalities of plasma proteins: Secondary | ICD-10-CM | POA: Diagnosis present

## 2019-05-10 DIAGNOSIS — M2550 Pain in unspecified joint: Secondary | ICD-10-CM | POA: Diagnosis not present

## 2019-05-10 DIAGNOSIS — Z7982 Long term (current) use of aspirin: Secondary | ICD-10-CM | POA: Diagnosis not present

## 2019-05-10 DIAGNOSIS — R579 Shock, unspecified: Secondary | ICD-10-CM | POA: Diagnosis present

## 2019-05-10 DIAGNOSIS — R0602 Shortness of breath: Secondary | ICD-10-CM | POA: Diagnosis not present

## 2019-05-10 DIAGNOSIS — Z515 Encounter for palliative care: Secondary | ICD-10-CM | POA: Diagnosis present

## 2019-05-10 DIAGNOSIS — E43 Unspecified severe protein-calorie malnutrition: Secondary | ICD-10-CM | POA: Diagnosis not present

## 2019-05-10 DIAGNOSIS — F17210 Nicotine dependence, cigarettes, uncomplicated: Secondary | ICD-10-CM | POA: Diagnosis present

## 2019-05-10 DIAGNOSIS — R0689 Other abnormalities of breathing: Secondary | ICD-10-CM | POA: Diagnosis not present

## 2019-05-10 DIAGNOSIS — Q248 Other specified congenital malformations of heart: Secondary | ICD-10-CM | POA: Diagnosis not present

## 2019-05-10 DIAGNOSIS — Z66 Do not resuscitate: Secondary | ICD-10-CM | POA: Diagnosis present

## 2019-05-10 DIAGNOSIS — N189 Chronic kidney disease, unspecified: Secondary | ICD-10-CM | POA: Diagnosis present

## 2019-05-10 DIAGNOSIS — Z7401 Bed confinement status: Secondary | ICD-10-CM | POA: Diagnosis not present

## 2019-05-10 DIAGNOSIS — Z7189 Other specified counseling: Secondary | ICD-10-CM | POA: Diagnosis not present

## 2019-05-10 DIAGNOSIS — G9341 Metabolic encephalopathy: Secondary | ICD-10-CM | POA: Diagnosis not present

## 2019-05-10 DIAGNOSIS — E872 Acidosis: Secondary | ICD-10-CM | POA: Diagnosis present

## 2019-05-10 DIAGNOSIS — IMO0001 Reserved for inherently not codable concepts without codable children: Secondary | ICD-10-CM

## 2019-05-10 DIAGNOSIS — H3530 Unspecified macular degeneration: Secondary | ICD-10-CM | POA: Diagnosis present

## 2019-05-10 DIAGNOSIS — R0600 Dyspnea, unspecified: Secondary | ICD-10-CM

## 2019-05-10 DIAGNOSIS — I214 Non-ST elevation (NSTEMI) myocardial infarction: Secondary | ICD-10-CM | POA: Diagnosis not present

## 2019-05-10 DIAGNOSIS — G9340 Encephalopathy, unspecified: Secondary | ICD-10-CM | POA: Diagnosis not present

## 2019-05-10 DIAGNOSIS — E781 Pure hyperglyceridemia: Secondary | ICD-10-CM | POA: Diagnosis present

## 2019-05-10 DIAGNOSIS — R69 Illness, unspecified: Secondary | ICD-10-CM | POA: Diagnosis not present

## 2019-05-10 DIAGNOSIS — R42 Dizziness and giddiness: Secondary | ICD-10-CM | POA: Diagnosis not present

## 2019-05-10 DIAGNOSIS — F419 Anxiety disorder, unspecified: Secondary | ICD-10-CM | POA: Diagnosis present

## 2019-05-10 DIAGNOSIS — I130 Hypertensive heart and chronic kidney disease with heart failure and stage 1 through stage 4 chronic kidney disease, or unspecified chronic kidney disease: Secondary | ICD-10-CM | POA: Diagnosis not present

## 2019-05-10 DIAGNOSIS — I350 Nonrheumatic aortic (valve) stenosis: Secondary | ICD-10-CM | POA: Diagnosis not present

## 2019-05-10 DIAGNOSIS — N10 Acute tubulo-interstitial nephritis: Secondary | ICD-10-CM | POA: Diagnosis not present

## 2019-05-10 DIAGNOSIS — R6520 Severe sepsis without septic shock: Secondary | ICD-10-CM | POA: Diagnosis present

## 2019-05-10 DIAGNOSIS — J9601 Acute respiratory failure with hypoxia: Secondary | ICD-10-CM | POA: Diagnosis not present

## 2019-05-10 DIAGNOSIS — B9689 Other specified bacterial agents as the cause of diseases classified elsewhere: Secondary | ICD-10-CM | POA: Diagnosis not present

## 2019-05-10 DIAGNOSIS — S2690XA Unspecified injury of heart, unspecified with or without hemopericardium, initial encounter: Secondary | ICD-10-CM

## 2019-05-10 DIAGNOSIS — Z9049 Acquired absence of other specified parts of digestive tract: Secondary | ICD-10-CM

## 2019-05-10 DIAGNOSIS — B961 Klebsiella pneumoniae [K. pneumoniae] as the cause of diseases classified elsewhere: Secondary | ICD-10-CM | POA: Diagnosis present

## 2019-05-10 DIAGNOSIS — Z20828 Contact with and (suspected) exposure to other viral communicable diseases: Secondary | ICD-10-CM | POA: Diagnosis not present

## 2019-05-10 DIAGNOSIS — R109 Unspecified abdominal pain: Secondary | ICD-10-CM | POA: Diagnosis not present

## 2019-05-10 DIAGNOSIS — E860 Dehydration: Secondary | ICD-10-CM | POA: Diagnosis present

## 2019-05-10 DIAGNOSIS — J9602 Acute respiratory failure with hypercapnia: Secondary | ICD-10-CM | POA: Diagnosis not present

## 2019-05-10 DIAGNOSIS — E782 Mixed hyperlipidemia: Secondary | ICD-10-CM | POA: Diagnosis present

## 2019-05-10 DIAGNOSIS — Z79899 Other long term (current) drug therapy: Secondary | ICD-10-CM | POA: Diagnosis not present

## 2019-05-10 DIAGNOSIS — Z90710 Acquired absence of both cervix and uterus: Secondary | ICD-10-CM

## 2019-05-10 DIAGNOSIS — F05 Delirium due to known physiological condition: Secondary | ICD-10-CM | POA: Diagnosis not present

## 2019-05-10 DIAGNOSIS — N170 Acute kidney failure with tubular necrosis: Secondary | ICD-10-CM | POA: Diagnosis not present

## 2019-05-10 DIAGNOSIS — I959 Hypotension, unspecified: Secondary | ICD-10-CM | POA: Diagnosis not present

## 2019-05-10 DIAGNOSIS — Z833 Family history of diabetes mellitus: Secondary | ICD-10-CM

## 2019-05-10 DIAGNOSIS — N183 Chronic kidney disease, stage 3 unspecified: Secondary | ICD-10-CM | POA: Diagnosis present

## 2019-05-10 DIAGNOSIS — N390 Urinary tract infection, site not specified: Secondary | ICD-10-CM | POA: Diagnosis present

## 2019-05-10 DIAGNOSIS — I340 Nonrheumatic mitral (valve) insufficiency: Secondary | ICD-10-CM | POA: Diagnosis not present

## 2019-05-10 DIAGNOSIS — R7989 Other specified abnormal findings of blood chemistry: Secondary | ICD-10-CM | POA: Diagnosis present

## 2019-05-10 DIAGNOSIS — A419 Sepsis, unspecified organism: Principal | ICD-10-CM | POA: Diagnosis present

## 2019-05-10 LAB — URINALYSIS, ROUTINE W REFLEX MICROSCOPIC
Bilirubin Urine: NEGATIVE
Glucose, UA: NEGATIVE mg/dL
Hgb urine dipstick: NEGATIVE
Ketones, ur: NEGATIVE mg/dL
Leukocytes,Ua: NEGATIVE
Nitrite: NEGATIVE
Protein, ur: NEGATIVE mg/dL
Specific Gravity, Urine: 1.009 (ref 1.005–1.030)
pH: 6 (ref 5.0–8.0)

## 2019-05-10 LAB — COMPREHENSIVE METABOLIC PANEL
ALT: 9 U/L (ref 0–44)
AST: 28 U/L (ref 15–41)
Albumin: 3.6 g/dL (ref 3.5–5.0)
Alkaline Phosphatase: 42 U/L (ref 38–126)
Anion gap: 9 (ref 5–15)
BUN: 25 mg/dL — ABNORMAL HIGH (ref 8–23)
CO2: 26 mmol/L (ref 22–32)
Calcium: 9.2 mg/dL (ref 8.9–10.3)
Chloride: 105 mmol/L (ref 98–111)
Creatinine, Ser: 1.85 mg/dL — ABNORMAL HIGH (ref 0.44–1.00)
GFR calc Af Amer: 27 mL/min — ABNORMAL LOW (ref >60)
GFR calc non Af Amer: 24 mL/min — ABNORMAL LOW (ref >60)
Glucose, Bld: 105 mg/dL — ABNORMAL HIGH (ref 70–99)
Potassium: 4.3 mmol/L (ref 3.5–5.1)
Sodium: 140 mmol/L (ref 135–145)
Total Bilirubin: 0.8 mg/dL (ref 0.3–1.2)
Total Protein: 6.2 g/dL — ABNORMAL LOW (ref 6.5–8.1)

## 2019-05-10 LAB — APTT: aPTT: 23 seconds — ABNORMAL LOW (ref 24–36)

## 2019-05-10 LAB — CBC WITH DIFFERENTIAL/PLATELET
Abs Immature Granulocytes: 0.03 10*3/uL (ref 0.00–0.07)
Basophils Absolute: 0 10*3/uL (ref 0.0–0.1)
Basophils Relative: 0 %
Eosinophils Absolute: 0 10*3/uL (ref 0.0–0.5)
Eosinophils Relative: 0 %
HCT: 39.8 % (ref 36.0–46.0)
Hemoglobin: 12.5 g/dL (ref 12.0–15.0)
Immature Granulocytes: 0 %
Lymphocytes Relative: 22 %
Lymphs Abs: 2.1 10*3/uL (ref 0.7–4.0)
MCH: 31.4 pg (ref 26.0–34.0)
MCHC: 31.4 g/dL (ref 30.0–36.0)
MCV: 100 fL (ref 80.0–100.0)
Monocytes Absolute: 0.7 10*3/uL (ref 0.1–1.0)
Monocytes Relative: 7 %
Neutro Abs: 6.6 10*3/uL (ref 1.7–7.7)
Neutrophils Relative %: 71 %
Platelets: 201 10*3/uL (ref 150–400)
RBC: 3.98 MIL/uL (ref 3.87–5.11)
RDW: 14 % (ref 11.5–15.5)
WBC: 9.4 10*3/uL (ref 4.0–10.5)
nRBC: 0 % (ref 0.0–0.2)

## 2019-05-10 LAB — TYPE AND SCREEN
ABO/RH(D): A POS
Antibody Screen: NEGATIVE

## 2019-05-10 LAB — BLOOD GAS, ARTERIAL
Acid-base deficit: 4.7 mmol/L — ABNORMAL HIGH (ref 0.0–2.0)
Bicarbonate: 22.3 mmol/L (ref 20.0–28.0)
Drawn by: 51425
FIO2: 0.21
O2 Saturation: 89.5 %
Patient temperature: 98
pCO2 arterial: 50.4 mmHg — ABNORMAL HIGH (ref 32.0–48.0)
pH, Arterial: 7.267 — ABNORMAL LOW (ref 7.350–7.450)
pO2, Arterial: 65 mmHg — ABNORMAL LOW (ref 83.0–108.0)

## 2019-05-10 LAB — RESPIRATORY PANEL BY RT PCR (FLU A&B, COVID)
Influenza A by PCR: NEGATIVE
Influenza B by PCR: NEGATIVE
SARS Coronavirus 2 by RT PCR: NEGATIVE

## 2019-05-10 LAB — LACTIC ACID, PLASMA
Lactic Acid, Venous: 1.8 mmol/L (ref 0.5–1.9)
Lactic Acid, Venous: 3.1 mmol/L (ref 0.5–1.9)
Lactic Acid, Venous: 1.7 mmol/L (ref 0.5–1.9)

## 2019-05-10 LAB — TROPONIN I (HIGH SENSITIVITY)
Troponin I (High Sensitivity): 1644 ng/L (ref <18)
Troponin I (High Sensitivity): 1827 ng/L (ref <18)

## 2019-05-10 LAB — D-DIMER, QUANTITATIVE: D-Dimer, Quant: 1.49 ug/mL-FEU — ABNORMAL HIGH (ref 0.00–0.50)

## 2019-05-10 LAB — BRAIN NATRIURETIC PEPTIDE: B Natriuretic Peptide: 367.4 pg/mL — ABNORMAL HIGH (ref 0.0–100.0)

## 2019-05-10 LAB — PROTIME-INR
INR: 1.1 (ref 0.8–1.2)
Prothrombin Time: 13.7 seconds (ref 11.4–15.2)

## 2019-05-10 MED ORDER — ALBUTEROL SULFATE HFA 108 (90 BASE) MCG/ACT IN AERS
4.0000 | INHALATION_SPRAY | Freq: Once | RESPIRATORY_TRACT | Status: AC
  Administered 2019-05-10: 19:00:00 4 via RESPIRATORY_TRACT
  Filled 2019-05-10: qty 6.7

## 2019-05-10 MED ORDER — IPRATROPIUM-ALBUTEROL 0.5-2.5 (3) MG/3ML IN SOLN
3.0000 mL | Freq: Four times a day (QID) | RESPIRATORY_TRACT | Status: DC
  Administered 2019-05-11 (×4): 3 mL via RESPIRATORY_TRACT
  Filled 2019-05-10 (×4): qty 3

## 2019-05-10 MED ORDER — VANCOMYCIN HCL IN DEXTROSE 1-5 GM/200ML-% IV SOLN
1000.0000 mg | Freq: Once | INTRAVENOUS | Status: AC
  Administered 2019-05-10: 19:00:00 1000 mg via INTRAVENOUS
  Filled 2019-05-10: qty 200

## 2019-05-10 MED ORDER — ASPIRIN EC 81 MG PO TBEC
81.0000 mg | DELAYED_RELEASE_TABLET | Freq: Every day | ORAL | Status: DC
  Administered 2019-05-11 – 2019-05-13 (×3): 81 mg via ORAL
  Filled 2019-05-10 (×3): qty 1

## 2019-05-10 MED ORDER — LACTATED RINGERS IV BOLUS (SEPSIS)
250.0000 mL | Freq: Once | INTRAVENOUS | Status: AC
  Administered 2019-05-10: 18:00:00 250 mL via INTRAVENOUS

## 2019-05-10 MED ORDER — HEPARIN BOLUS VIA INFUSION
3000.0000 [IU] | Freq: Once | INTRAVENOUS | Status: AC
  Administered 2019-05-10: 3000 [IU] via INTRAVENOUS
  Filled 2019-05-10: qty 3000

## 2019-05-10 MED ORDER — ASPIRIN 81 MG PO CHEW
324.0000 mg | CHEWABLE_TABLET | Freq: Once | ORAL | Status: AC
  Administered 2019-05-10: 23:00:00 324 mg via ORAL
  Filled 2019-05-10: qty 4

## 2019-05-10 MED ORDER — SODIUM CHLORIDE 0.9 % IV SOLN
2.0000 g | INTRAVENOUS | Status: DC
  Administered 2019-05-11 – 2019-05-12 (×2): 2 g via INTRAVENOUS
  Filled 2019-05-10 (×2): qty 2

## 2019-05-10 MED ORDER — LACTATED RINGERS IV BOLUS (SEPSIS)
500.0000 mL | Freq: Once | INTRAVENOUS | Status: AC
  Administered 2019-05-10: 500 mL via INTRAVENOUS

## 2019-05-10 MED ORDER — ACETAMINOPHEN 325 MG PO TABS
650.0000 mg | ORAL_TABLET | ORAL | Status: DC | PRN
  Administered 2019-05-12 – 2019-05-13 (×5): 650 mg via ORAL
  Filled 2019-05-10 (×6): qty 2

## 2019-05-10 MED ORDER — LACTATED RINGERS IV BOLUS (SEPSIS)
1000.0000 mL | Freq: Once | INTRAVENOUS | Status: AC
  Administered 2019-05-10: 1000 mL via INTRAVENOUS

## 2019-05-10 MED ORDER — ALBUTEROL SULFATE (2.5 MG/3ML) 0.083% IN NEBU: 2.5000 mg | INHALATION_SOLUTION | RESPIRATORY_TRACT | Status: DC | PRN

## 2019-05-10 MED ORDER — SODIUM CHLORIDE 0.9 % IV SOLN
2.0000 g | Freq: Once | INTRAVENOUS | Status: AC
  Administered 2019-05-10: 18:00:00 2 g via INTRAVENOUS
  Filled 2019-05-10: qty 2

## 2019-05-10 MED ORDER — HEPARIN (PORCINE) 25000 UT/250ML-% IV SOLN
900.0000 [IU]/h | INTRAVENOUS | Status: DC
  Administered 2019-05-10: 900 [IU]/h via INTRAVENOUS
  Filled 2019-05-10: qty 250

## 2019-05-10 MED ORDER — ONDANSETRON HCL 4 MG/2ML IJ SOLN: 4.0000 mg | Freq: Four times a day (QID) | INTRAMUSCULAR | Status: DC | PRN

## 2019-05-10 NOTE — ED Notes (Signed)
Date and time results received: 05/10/19 8:03 PM  (use smartphrase ".now" to insert current time)  Test: lactic acid Critical Value: 3.1  Name of Provider Notified: Dorie Rank, MD  Orders Received? Or Actions Taken?:

## 2019-05-10 NOTE — Progress Notes (Signed)
A consult was received from an ED physician for vancomycin and cefepime per pharmacy dosing (for an indication other than meningitis). The patient's profile has been reviewed for ht/wt/allergies/indication/available labs. A one time order has been placed for the above antibiotics.  Further antibiotics/pharmacy consults should be ordered by admitting physician if indicated.                       Reuel Boom, PharmD, BCPS [phone] 05/10/2019, 4:58 PM

## 2019-05-10 NOTE — ED Notes (Addendum)
Date and time results received: 05/10/19 1926 (use smartphrase ".now" to insert current time)  Test: Troponin Critical MYTRZ:7356 Name of Provider Notified: Tomi Bamberger Md  Orders Received? Or Actions Taken?: waiting on order

## 2019-05-10 NOTE — Progress Notes (Signed)
ABG sample collected and lab notified of sample being sent.

## 2019-05-10 NOTE — Progress Notes (Signed)
[place] for IV heparin Indication: pulmonary embolus  Allergies  Allergen Reactions  . Ciprofloxacin Hives and Other (See Comments)    Got hives after 10 days of both cipro and flagyl, not sure which caused the hives  . Flagyl [Metronidazole] Hives and Other (See Comments)    Got hives after 10 days of both cipro and flagyl, not sure which caused the hives  . Morphine And Related Nausea And Vomiting    Patient Measurements:   Heparin Dosing Weight: TBW  Vital Signs: BP: 114/57 (12/27 1830) Pulse Rate: 87 (12/27 1830)  Labs: Recent Labs    05/10/19 1707  HGB 12.5  HCT 39.8  PLT 201  APTT 23*  LABPROT 13.7  INR 1.1  CREATININE 1.85*    Estimated Creatinine Clearance: 17.8 mL/min (A) (by C-G formula based on SCr of 1.85 mg/dL (H)).   Medical History: Past Medical History:  Diagnosis Date  . Chest wall pain 2003   atypical, now resolved  . CKD (chronic kidney disease), stage III    Cr 1.27, GFR 40 (07/2011)  . Diverticulitis    with DT flare in 6/09  . Gout    R hand  . Hypertension   . Hypertriglyceridemia   . Macular degeneration    Dr. Peter Garter  . Pacemaker    sees Dr. Recardo [facility]) annually. Pacemaker replaced 2007 Tami Ribas)  . Sick sinus syndrome (HCC) with bradycardia  . Tobacco use disorder    quit 01/2013--restarted in 2017  . Urge incontinence     Medications:  (Not in a hospital admission)  Scheduled:  . albuterol  4 puff Inhalation Once  . heparin  3,000 Units Intravenous Once   Assessment: 36 yoF presenting with hypotension; concern for possible PE with elevated troponin an D-dimer (POC COVID Ag test reportedly negative). Unable to do CTA d/t AKI; may get echo or VQ scan, but Pharmacy consulted to start heparin infusion for PE r/o in the meantime.   Baseline INR, aPTT: WNL  Prior anticoagulation: none, ASA only PTA  Significant events:  Today, 05/10/2019:  CBC: WNL  No bleeding or infusion  issues per nursing  SCr elevated on admission  Goal of Therapy: Heparin level 0.3-0.7 units/ml Monitor platelets by anticoagulation protocol: Yes  Plan:  Heparin 3000 units IV bolus x 1  Heparin 900 units/hr IV infusion  Check heparin level 8 hrs after start  Daily CBC, daily heparin level once stable  Monitor for signs of bleeding or thrombosis  F/u any additional imaging for PE r/o or confirmation  Reuel Boom, PharmD, BCPS [phone] 05/10/2019, 7:03 PM

## 2019-05-10 NOTE — ED Notes (Signed)
Dorna Leitz [phone]. Please call with update.

## 2019-05-10 NOTE — H&P (Signed)
NAME:  [name], MRN:  [id number], DOB:  02/22/1928, LOS: 0 ADMISSION DATE:  05/10/2019, CONSULTATION DATE:  05/10/2019 REFERRING MD:  Lynelle DoctorKnapp, CHIEF COMPLAINT:  SOB  Brief History   85 year old female admitted with hypertension and elevated troponin.  History of present illness   85 year old female with past medical history of pacemaker for bradycardia, no CAD who came to emergency department for presyncopal symptoms and low blood pressure.  She reports that 2 weeks ago she was diagnosed with urinary tract infection and she was having feeling of dizziness at that time.  Initially she was treated with Macrobid, later her urine culture grew Klebsiella which was resistant to Macrobid and patient was switched to Bactrim DS twice daily on 05/05/2019.  Patient reports that she took the last dose of that antibiotic today but has been feeling dizzy with fear of falling, she lives alone and was concerned and came to emergency department.  She denies any significant nausea or vomiting but does report decreased urine output.  She denies any chest pain.  Patient denied any fever at home, abdominal pain but did report leg swelling which has recently improved after increasing her Lasix dose.  On presentation her blood pressure was 83/47 patient received 2 L Ringer's lactate bolus with improvement to 104/66 and MAPs has been staying above 65.  She appears to be tachypneic but is saturating more than 95% on 2 L oxygen.  ABG showed 7.2 6/50/65.  Creatinine 1.85 increased from baseline of 1.23.  Troponin 1644 increased to 1827.  Lactate was initially normal at 1.8 which increased to 3.1.  WBC 9.4. patient underwent CT abdomen/pelvis and chest which showed interstitial pulmonary edema but otherwise unremarkable.  UA appears to be unremarkable as well.  Past Medical History  CKD Sick sinus syndrome status post pacemaker placement  Significant Hospital Events     Consults:  Cardiology  Procedures:     Significant Diagnostic Tests:  CT abdomen/pelvis/chest: Interstitial pulmonary edema, possible old T3 fracture  Micro Data:  Blood culture 12/27 Urine culture12/27 Antimicrobials:  Vancomycin x112/27 Cefepime started 12/27  Interim history/subjective:    Objective   Blood pressure 116/74, pulse 77, temperature (!) 97.5 F (36.4 C), temperature source Oral, resp. rate 16, height 5\' 4"  (1.626 m), weight 53.5 kg, SpO2 98 %.        Intake/Output Summary (Last 24 hours) at 05/10/2019 2233 Last data filed at 05/10/2019 2106 Gross per 24 hour  Intake 200 ml  Output --  Net 200 ml   Filed Weights   05/10/19 2035  Weight: 53.5 kg    Examination: General: Appears slightly tachypneic but breathing comfortably HENT: Central trachea, EOMI, PERRL, no obvious injury Lungs: Decreased air entry but no wheezing or crackles Cardiovascular: S1 plus S2 +0, paced rhythm Abdomen: Soft nontender nondistended, mild tenderness in suprapubic area due to full bladder Extremities: No lower extremity edema Neuro: Grossly intact GU: Deferred  Resolved Hospital Problem list     Assessment & Plan:  85 year old female brought to emergency department for dizziness, presyncope and lower abdominal pain.  Initial blood pressure was low which improved with IV fluid resuscitation.  Unclear etiology of presenting symptoms likely poor p.o. intake with recent UTI, Bactrim use.   1. Hypotension on presentation: DD include likely poor p.o. intake versus severe sepsis although UA today looks unremarkable and she completed Bactrim for 7 days today. Unlikely to be PE, as patient does have interstitial thickening suggestive of pulmonary edema and maintaining saturations  more than 95% on 2 L, however patient started on heparin drip Improved with IV resuscitation, lactic acid improved, continue to monitor Check echocardiogram in a.m.  2. Hypotension with possible infection, unclear source Urinary source is  likely as patient has lower abdominal tenderness with recent UTI although she has completed Bactrim for 7 days Urine culture is in process, continue cefepime until the culture was negative  3. Acute kidney injury on CKD Admission creatinine 1.85 with baseline 1.23 Likely combination of poor p.o. intake and ATN caused by Bactrim Monitor urine output, received resuscitation of IV fluid in the ED, continue to monitor renal function  4. Interstitial pulmonary edema on CT chest: Patient is on home Lasix which likely contributed towards worsening of creatinine We will resume diuresis once her blood pressure has been stable for 6 to 12 hours  5. Emphysema/COPD Not in acute exacerbation DuoNeb every 6 hours with albuterol every 2 hours as needed  6. Elevated troponin: Patient was essentially chest pain-free Discussed with cardiology, given aspirin and started on heparin Check hemoglobin A1c and lipid panel for respiratory location Could be secondary to demand ischemia  Best practice:  Diet: full liquid advance as tolerated Pain/Anxiety/Delirium protocol (if indicated): NA VAP protocol (if indicated): NA DVT prophylaxis: Heparin GTT GI prophylaxis: NA Glucose control: NA Mobility: Out of bed to chair Code Status: DNR/DNI after discussion with patient Family Communication: Daughter Disposition: Admitted to ICU  Labs   CBC: Recent Labs  Lab 05/10/19 1707  WBC 9.4  NEUTROABS 6.6  HGB 12.5  HCT 39.8  MCV 100.0  PLT 201    Basic Metabolic Panel: Recent Labs  Lab 05/10/19 1707  NA 140  K 4.3  CL 105  CO2 26  GLUCOSE 105*  BUN 25*  CREATININE 1.85*  CALCIUM 9.2   GFR: Estimated Creatinine Clearance: 17.4 mL/min (A) (by C-G formula based on SCr of 1.85 mg/dL (H)). Recent Labs  Lab 05/10/19 1707 05/10/19 1708 05/10/19 1901  WBC 9.4  --   --   LATICACIDVEN  --  1.8 3.1*    Liver Function Tests: Recent Labs  Lab 05/10/19 1707  AST 28  ALT 9  ALKPHOS 42   BILITOT 0.8  PROT 6.2*  ALBUMIN 3.6   No results for input(s): LIPASE, AMYLASE in the last 168 hours. No results for input(s): AMMONIA in the last 168 hours.  ABG    Component Value Date/Time   PHART 7.267 (L) 05/10/2019 1940   PCO2ART 50.4 (H) 05/10/2019 1940   PO2ART 65.0 (L) 05/10/2019 1940   HCO3 22.3 05/10/2019 1940   ACIDBASEDEF 4.7 (H) 05/10/2019 1940   O2SAT 89.5 05/10/2019 1940     Coagulation Profile: Recent Labs  Lab 05/10/19 1707  INR 1.1    Cardiac Enzymes: No results for input(s): CKTOTAL, CKMB, CKMBINDEX, TROPONINI in the last 168 hours.  HbA1C: No results found for: HGBA1C  CBG: No results for input(s): GLUCAP in the last 168 hours.  Review of Systems:   A complete review of systems is negative except as listed above in HPI.  Past Medical History  She,  has a past medical history of Chest wall pain (2003), CKD (chronic kidney disease), stage III, Diverticulitis, Gout, Hypertension, Hypertriglyceridemia, Macular degeneration, Pacemaker, Sick sinus syndrome (HCC) (with bradycardia), Tobacco use disorder, and Urge incontinence.   Surgical History    Past Surgical History:  Procedure Laterality Date  . ABDOMINAL HYSTERECTOMY  1978   partial  . APPENDECTOMY  1952  .  CARDIAC CATHETERIZATION  2000  . CHOLECYSTECTOMY  1998   Dr. Harlow Asa  . COLON SURGERY  11/2009   vaginal-colon fistula repair (Drs. Danne Baxter)  . CYSTOSCOPY  2002   Dr. Reece Agar  . EYE SURGERY  11/2004   cataract surgery b/l - Dr.Bergen  . [place], 2000, 2007  . PPM GENERATOR CHANGEOUT N/A 10/17/2017   Procedure: PPM GENERATOR CHANGEOUT;  Surgeon: Sanda Klein, MD;  Location: [facility];  Service: Cardiovascular;  Laterality: N/A;  . [place] History   reports that she has been smoking. She has a 20.00 pack-year smoking history. She has never used smokeless tobacco. She reports that she does not drink alcohol or use drugs.    Family History   Her family history includes Alcohol abuse in her brother and daughter; Alzheimer's disease (age of onset: 66) in her brother; Cancer in her father, sister, and another family member; Cancer (age of onset: 41) in her brother; Crohn's disease in her daughter; Diabetes in her brother and mother; Heart disease in her brother, father, and mother; Heart disease (age of onset: 73) in an other family member; Hyperlipidemia in her father and mother; Hypertension in her father and mother; Stroke (age of onset: 56) in an other family member; Ulcerative colitis in her daughter.   Allergies Allergies  Allergen Reactions  . Ciprofloxacin Hives and Other (See Comments)    Got hives after 10 days of both cipro and flagyl, not sure which caused the hives  . Flagyl [Metronidazole] Hives and Other (See Comments)    Got hives after 10 days of both cipro and flagyl, not sure which caused the hives  . Morphine And Related Nausea And Vomiting     Home Medications  Prior to Admission medications   Medication Sig Start Date End Date Taking? Authorizing Provider  acetaminophen (TYLENOL) 500 MG tablet Take 1,000 mg by mouth every 6 (six) hours as needed.    Yes [provider]  aspirin EC 81 MG tablet Take 81 mg by mouth daily.   Yes [provider]  calcium carbonate (OS-CAL) 600 MG TABS Take 600 mg by mouth daily. With Vitamin D   Yes [provider]  furosemide (LASIX) 40 MG tablet TAKE 2 TABLETS ( 80 MG ) DAILY *NEEDS OFFICE VISIT FOR FURTHER REFILLS* Patient taking differently: Take 40 mg by mouth daily.  04/14/19  Yes Croitoru, Mihai, MD  loratadine (CLARITIN) 10 MG tablet Take 10 mg by mouth daily.   Yes [provider]  metoprolol tartrate (LOPRESSOR) 25 MG tablet TAKE 1 TABLET BY MOUTH TWICE A DAY Patient taking differently: Take 12.5-25 mg by mouth 2 (two) times daily. 12.5mg  in am 25mg  in pm 12/03/18  Yes Croitoru, Mihai, MD  Multiple Vitamins-Minerals  (OCUVITE PRESERVISION PO) Take 1 tablet by mouth daily.   Yes [provider]  Omega-3 Fatty Acids (FISH OIL) 1000 MG CAPS Take 2,000 mg by mouth daily.   Yes [provider]  potassium chloride (K-DUR) 10 MEQ tablet Take 20 mEq by mouth daily as needed.  06/26/18  Yes Croitoru, Mihai, MD  sulfamethoxazole-trimethoprim (BACTRIM DS) 800-160 MG tablet Take 1 tablet by mouth 2 (two) times daily. 05/04/19  Yes Rita Ohara, MD     Critical care time: 40 min excluding any procedures

## 2019-05-10 NOTE — ED Triage Notes (Signed)
Patient presented to ed with c/o SOB and generalized weakness. Patient just finish antibiotic yesterday for UTI. Patient been given 500 ns bolus per ems.

## 2019-05-10 NOTE — Progress Notes (Signed)
Spoke with RN via secure chat, RN aware of elevated LA with need to f/u with additional, RN very receptive and already aware

## 2019-05-10 NOTE — ED Provider Notes (Signed)
[facility]-EMERGENCY DEPT Provider Note   CSN: [id number] Arrival date & time: 05/10/19  1628     History Chief Complaint  Patient presents with   Code Sepsis    [name] is a 85 y.o. female.  HPI   Patient presents to the ED for evaluation of hypotension.  Patient states for the past week or so she has been having issues with dizziness.  She went to see her doctor and was diagnosed with vertigo and a urinary tract infection this past week.  Initially the patient was having stomach bloating and an abnormal urine odor but no dysuria.  Patient denies that she has had any blood in her stool or dark black stools.  She has had some pain in her lower abdomen as well.  Patient had a urine dip that showed positive nitrites and trace leukocyte esterase.  She was started on antibiotics, Macrobid.  Patient's urine culture showed Klebsiella that was resistant to Macrobid and the patient was changed to Septra on the 21st.  Patient continues to have troubles with feeling dizzy.  She has been having trouble with lower abdominal discomfort.  She has been feeling short of breath.  EMS found the patient to be hypotensive.  She was given IV fluids.  She was transported the ED for further evaluation  Past Medical History:  Diagnosis Date   Chest wall pain 2003   atypical, now resolved   CKD (chronic kidney disease), stage III    Cr 1.27, GFR 40 (07/2011)   Diverticulitis    with DT flare in 6/09   Gout    R hand   Hypertension    Hypertriglyceridemia    Macular degeneration    Dr. Sharlot Gowda   Pacemaker    sees Dr. Rubie Maid [facility]) annually. Pacemaker replaced 2007 [facility])   Sick sinus syndrome [facility]) with bradycardia   Tobacco use disorder    quit 01/2013--restarted in 2017   Urge incontinence     Patient Active Problem List   Diagnosis Date Noted   DOE (dyspnea on exertion) 06/20/2018   Chest pain 06/20/2018   Pacemaker battery depletion 10/13/2017    Sick sinus syndrome (HCC) 10/01/2017   Chronic diastolic heart failure (HCC) 08/02/2017   PAT (paroxysmal atrial tachycardia) (HCC) 09/08/2015   Mixed hyperlipidemia 09/08/2015   Pacemaker - dual chamber, [employer]- unipolar leads 1994 04/21/2013   Tobacco abuse 10/22/2011   Pure hyperglyceridemia 10/22/2011   Essential hypertension, benign 10/22/2011   CRI (chronic renal insufficiency), stage 3 (moderate) 10/22/2011    Past Surgical History:  Procedure Laterality Date   ABDOMINAL HYSTERECTOMY  1978   partial   APPENDECTOMY  1952   CARDIAC CATHETERIZATION  2000   CHOLECYSTECTOMY  1998   Dr. Gerrit Friends   COLON SURGERY  11/2009   vaginal-colon fistula repair (Drs. Delane Ginger)   CYSTOSCOPY  2002   Dr. Wanda Plump   EYE SURGERY  11/2004   cataract surgery b/l - Dr.Bergen   PACEMAKER INSERTION  1994, 2000, 2007   PPM GENERATOR CHANGEOUT N/A 10/17/2017   Procedure: PPM GENERATOR CHANGEOUT;  Surgeon: Thurmon Fair, MD;  Location: [facility] INVASIVE CV LAB;  Service: Cardiovascular;  Laterality: N/A;   TUBAL LIGATION  1960     OB History    Gravida  5   Para  4   Term      Preterm      AB  1   Living  4     SAB  1  TAB      Ectopic      Multiple      Live Births              Family History  Problem Relation Age of Onset   Diabetes Mother    Heart disease Mother    Hyperlipidemia Mother    Hypertension Mother    Heart disease Father        pacemaker, CABG   Hyperlipidemia Father    Hypertension Father    Cancer Father        prostate   Cancer Sister        breast cancer 70's, then recurrence, metastatic, died at 83   Diabetes Brother    Alcohol abuse Brother    Heart disease Brother    Crohn's disease Daughter    Alzheimer's disease Brother 91   Cancer Brother 62       lymphoma   Ulcerative colitis Daughter    Alcohol abuse Daughter        >30 years sober 2019   Heart disease Other 71   Stroke Other 64   Cancer  Other        breast    Social History   Tobacco Use   Smoking status: Current Some Day Smoker    Packs/day: 0.50    Years: 40.00    Pack years: 20.00    Last attempt to quit: 01/12/2013    Years since quitting: 6.3   Smokeless tobacco: Never Used   Tobacco comment: smokes 1/2 PPD, quite 01/2013, restarted in 2017; smokes more when her daughter who smokes visits  Substance Use Topics   Alcohol use: No    Alcohol/week: 0.0 standard drinks   Drug use: No    Home Medications Prior to Admission medications   Medication Sig Start Date End Date Taking? Authorizing Provider  acetaminophen (TYLENOL) 500 MG tablet Take 1,000 mg by mouth every 6 (six) hours as needed.    Yes [provider]  aspirin EC 81 MG tablet Take 81 mg by mouth daily.   Yes [provider]  calcium carbonate (OS-CAL) 600 MG TABS Take 600 mg by mouth daily. With Vitamin D   Yes [provider]  furosemide (LASIX) 40 MG tablet TAKE 2 TABLETS ( 80 MG ) DAILY *NEEDS OFFICE VISIT FOR FURTHER REFILLS* Patient taking differently: Take 40 mg by mouth daily.  04/14/19  Yes Croitoru, Mihai, MD  loratadine (CLARITIN) 10 MG tablet Take 10 mg by mouth daily.   Yes [provider]  metoprolol tartrate (LOPRESSOR) 25 MG tablet TAKE 1 TABLET BY MOUTH TWICE A DAY Patient taking differently: Take 12.5-25 mg by mouth 2 (two) times daily. 12.5mg  in am  in pm 12/03/18  Yes Croitoru, Mihai, MD  Multiple Vitamins-Minerals (OCUVITE PRESERVISION PO) Take 1 tablet by mouth daily.   Yes [provider]  Omega-3 Fatty Acids (FISH OIL) 1000 MG CAPS Take 2,000 mg by mouth daily.   Yes [provider]  potassium chloride (K-DUR) 10 MEQ tablet Take 20 mEq by mouth daily as needed.  06/26/18  Yes Croitoru, Mihai, MD  sulfamethoxazole-trimethoprim (BACTRIM DS) 800-160 MG tablet Take 1 tablet by mouth 2 (two) times daily. 05/04/19  Yes Joselyn Arrow, MD    Allergies    Ciprofloxacin, Flagyl  [metronidazole], and Morphine and related  Review of Systems   Review of Systems  Constitutional: Negative for fever.  Respiratory: Positive for shortness of breath.   Cardiovascular: Negative  for chest pain.  Gastrointestinal: Positive for abdominal pain. Negative for blood in stool and vomiting.  All other systems reviewed and are negative.   Physical Exam Updated Vital Signs BP (!) 101/55    Pulse 81    Resp (!) 25    Ht 1.626 m (5\' 4" )    Wt 53.5 kg    SpO2 95%    BMI 20.25 kg/m   Physical Exam Vitals and nursing note reviewed.  Constitutional:      Appearance: She is well-developed. She is ill-appearing.  HENT:     Head: Normocephalic and atraumatic.     Right Ear: External ear normal.     Left Ear: External ear normal.  Eyes:     General: No scleral icterus.       Right eye: No discharge.        Left eye: No discharge.     Conjunctiva/sclera: Conjunctivae normal.  Neck:     Trachea: No tracheal deviation.  Cardiovascular:     Rate and Rhythm: Normal rate and regular rhythm.  Pulmonary:     Effort: Pulmonary effort is normal. Tachypnea present. No respiratory distress.     Breath sounds: Normal breath sounds. No stridor. No wheezing or rales.  Abdominal:     General: Bowel sounds are normal. There is no distension.     Palpations: Abdomen is soft.     Tenderness: There is abdominal tenderness. There is no guarding or rebound.     Comments: Mild ttp suprapubic region, no pulsatile mass  Musculoskeletal:        General: No tenderness.     Cervical back: Neck supple.     Right lower leg: No edema.     Left lower leg: No edema.  Skin:    General: Skin is warm and dry.     Findings: No rash.  Neurological:     Mental Status: She is alert.     Cranial Nerves: No cranial nerve deficit (no facial droop, extraocular movements intact, no slurred speech).     Sensory: No sensory deficit.     Motor: No abnormal muscle tone or seizure activity.     Coordination:  Coordination normal.     ED Results / Procedures / Treatments   Labs (all labs ordered are listed, but only abnormal results are displayed) Labs Reviewed  COMPREHENSIVE METABOLIC PANEL - Abnormal; Notable for the following components:      Result Value   Glucose, Bld 105 (*)    BUN 25 (*)    Creatinine, Ser 1.85 (*)    Total Protein 6.2 (*)    GFR calc non Af Amer 24 (*)    GFR calc Af Amer 27 (*)    All other components within normal limits  LACTIC ACID, PLASMA - Abnormal; Notable for the following components:   Lactic Acid, Venous 3.1 (*)    All other components within normal limits  APTT - Abnormal; Notable for the following components:   aPTT 23 (*)    All other components within normal limits  D-DIMER, QUANTITATIVE (NOT AT [facility]) - Abnormal; Notable for the following components:   D-Dimer, Quant 1.49 (*)    All other components within normal limits  BLOOD GAS, ARTERIAL - Abnormal; Notable for the following components:   pH, Arterial 7.267 (*)    pCO2 arterial 50.4 (*)    pO2, Arterial 65.0 (*)    Acid-base deficit 4.7 (*)    All other components within normal  limits  TROPONIN I (HIGH SENSITIVITY) - Abnormal; Notable for the following components:   Troponin I (High Sensitivity) 1,644 (*)    All other components within normal limits  TROPONIN I (HIGH SENSITIVITY) - Abnormal; Notable for the following components:   Troponin I (High Sensitivity) 1,827 (*)    All other components within normal limits  CULTURE, BLOOD (ROUTINE X 2)  CULTURE, BLOOD (ROUTINE X 2)  URINE CULTURE  RESPIRATORY PANEL BY RT PCR (FLU A&B, COVID)  LACTIC ACID, PLASMA  CBC WITH DIFFERENTIAL/PLATELET  PROTIME-INR  URINALYSIS, ROUTINE W REFLEX MICROSCOPIC  HEPARIN LEVEL (UNFRACTIONATED)  CBC  BRAIN NATRIURETIC PEPTIDE  POC SARS CORONAVIRUS 2 AG -  ED  TYPE AND SCREEN    EKG EKG Interpretation  Date/Time:  Sunday May 10 2019 16:45:26 EST Ventricular Rate:  72 PR Interval:    QRS  Duration: 95 QT Interval:  419 QTC Calculation: 456 R Axis:   73 Text Interpretation: Atrial-paced rhythm Nonspecific T abnormalities, lateral leads  t wave changes new since last tracing Confirmed by Linwood Dibbles [phone]) on 05/10/2019 5:06:19 PM   Radiology CT ABDOMEN PELVIS WO CONTRAST  Result Date: 05/10/2019 CLINICAL DATA:  Shortness of breath and nonlocalized abdominal pain EXAM: CT CHEST, ABDOMEN AND PELVIS WITHOUT CONTRAST TECHNIQUE: Multidetector CT imaging of the chest, abdomen and pelvis was performed following the standard protocol without IV contrast. COMPARISON:  Chest radiograph 05/10/2019, CT abdomen pelvis 12/14/2014 FINDINGS: CT CHEST FINDINGS Cardiovascular: Right chest wall pacer pack with leads directed towards the right atrium and ventricle. Normal cardiac size. Extensive coronary artery calcium. Trace pericardial fluid, likely at the upper limits of normal. Extensive atherosclerotic calcification of the thoracic aorta. Aorta is normal caliber. Normal 3 vessel branching of the aortic arch. Extensive calcification of the proximal great vessels. Central pulmonary arteries are the upper limits of normal for size. Luminal evaluation precluded in the absence of contrast. Mediastinum/Nodes: Few scattered low-attenuation subcentimeter mediastinal nodes, nonspecific and potentially reactive or edematous. No pathologically enlarged mediastinal or axillary adenopathy. Hilar lymph nodes difficult to assess in absence of contrast media. Thyroid gland and thoracic inlet is unremarkable. The no acute abnormality of trachea or esophagus. Posterior bowing of the trachea likely related to exhalation during imaging. Lungs/Pleura: There is diffuse interlobular septal thickening as well as peribronchovascular thickening and trace bilateral effusions with adjacent passive and dependent atelectasis. Suspect some underlying emphysematous change though difficult to fully assess underlying parenchyma given  extensive respiratory motion. No consolidative opacity. No pneumothorax. Biapical pleuroparenchymal scarring is present. Musculoskeletal: Multilevel degenerative changes are present in the imaged portions of the spine. No acute osseous abnormality or suspicious osseous lesion. Assessment for rib fractures is limited given motion artifact with step artifact on multiplanar reconstruction. No gross rib deformities. CT ABDOMEN PELVIS FINDINGS Hepatobiliary: No focal liver abnormality is seen. Patient is post cholecystectomy. Slight prominence of the biliary tree likely related to reservoir effect. No calcified intraductal gallstones. Pancreas: Extensive fatty replacement of the pancreas. No peripancreatic inflammation or ductal dilatation. Spleen: Normal in size without focal abnormality. Adrenals/Urinary Tract: Normal adrenal glands. Atrophic kidneys. No visible concerning renal lesions. No urolithiasis or hydronephrosis. Urinary bladder is largely decompressed at the time of exam and therefore poorly evaluated by CT imaging. Stomach/Bowel: Distal esophagus, stomach and duodenal sweep are unremarkable. No small bowel wall thickening or dilatation. No evidence of obstruction. The appendix is surgically absent. No colonic dilatation or wall thickening. Scattered colonic diverticula without focal pericolonic inflammation to suggest diverticulitis. Vascular/Lymphatic: Atherosclerotic plaque  within the normal caliber aorta. No suspicious or enlarged lymph nodes in the included lymphatic chains. Reproductive: Small amount of air within the vaginal canal. Uterus is surgically absent. No concerning adnexal lesions. Other: Circumferential body wall edema as well as mid mesenteric increased attenuation. No free fluid. No free air. No bowel containing hernias. Musculoskeletal: Diffuse muscular atrophy, likely senescent or related to deconditioning. Split deformity of the L3 vertebral body 60% central height loss, new since  comparison August 2016. Age indeterminate on this exam though favor remote. IMPRESSION: 1. Interstitial pulmonary edema with trace bilateral effusions. 2. Split deformity of the L3 vertebral body, AOSpine A2 ,60% central height loss, new since comparison August 2016. Age indeterminate though favor remote. Recommend correlation with point tenderness. 3. Features of mild anasarca with circumferential body wall edema as well as likely mild been mesenteric edema 4. Fatty replacement of the pancreas. 5. Renal atrophy. 6. Prior hysterectomy, appendectomy, cholecystectomy. 7.  Aortic Atherosclerosis (ICD10-I70.0). 8.  Emphysema (ICD10-J43.9). 9. Extensive coronary atherosclerosis. Electronically Signed   By: Kreg Shropshire M.D.   On: 05/10/2019 19:14   CT CHEST WO CONTRAST  Result Date: 05/10/2019 CLINICAL DATA:  Shortness of breath and nonlocalized abdominal pain EXAM: CT CHEST, ABDOMEN AND PELVIS WITHOUT CONTRAST TECHNIQUE: Multidetector CT imaging of the chest, abdomen and pelvis was performed following the standard protocol without IV contrast. COMPARISON:  Chest radiograph 05/10/2019, CT abdomen pelvis 12/14/2014 FINDINGS: CT CHEST FINDINGS Cardiovascular: Right chest wall pacer pack with leads directed towards the right atrium and ventricle. Normal cardiac size. Extensive coronary artery calcium. Trace pericardial fluid, likely at the upper limits of normal. Extensive atherosclerotic calcification of the thoracic aorta. Aorta is normal caliber. Normal 3 vessel branching of the aortic arch. Extensive calcification of the proximal great vessels. Central pulmonary arteries are the upper limits of normal for size. Luminal evaluation precluded in the absence of contrast. Mediastinum/Nodes: Few scattered low-attenuation subcentimeter mediastinal nodes, nonspecific and potentially reactive or edematous. No pathologically enlarged mediastinal or axillary adenopathy. Hilar lymph nodes difficult to assess in absence of  contrast media. Thyroid gland and thoracic inlet is unremarkable. The no acute abnormality of trachea or esophagus. Posterior bowing of the trachea likely related to exhalation during imaging. Lungs/Pleura: There is diffuse interlobular septal thickening as well as peribronchovascular thickening and trace bilateral effusions with adjacent passive and dependent atelectasis. Suspect some underlying emphysematous change though difficult to fully assess underlying parenchyma given extensive respiratory motion. No consolidative opacity. No pneumothorax. Biapical pleuroparenchymal scarring is present. Musculoskeletal: Multilevel degenerative changes are present in the imaged portions of the spine. No acute osseous abnormality or suspicious osseous lesion. Assessment for rib fractures is limited given motion artifact with step artifact on multiplanar reconstruction. No gross rib deformities. CT ABDOMEN PELVIS FINDINGS Hepatobiliary: No focal liver abnormality is seen. Patient is post cholecystectomy. Slight prominence of the biliary tree likely related to reservoir effect. No calcified intraductal gallstones. Pancreas: Extensive fatty replacement of the pancreas. No peripancreatic inflammation or ductal dilatation. Spleen: Normal in size without focal abnormality. Adrenals/Urinary Tract: Normal adrenal glands. Atrophic kidneys. No visible concerning renal lesions. No urolithiasis or hydronephrosis. Urinary bladder is largely decompressed at the time of exam and therefore poorly evaluated by CT imaging. Stomach/Bowel: Distal esophagus, stomach and duodenal sweep are unremarkable. No small bowel wall thickening or dilatation. No evidence of obstruction. The appendix is surgically absent. No colonic dilatation or wall thickening. Scattered colonic diverticula without focal pericolonic inflammation to suggest diverticulitis. Vascular/Lymphatic: Atherosclerotic plaque within the normal  caliber aorta. No suspicious or enlarged  lymph nodes in the included lymphatic chains. Reproductive: Small amount of air within the vaginal canal. Uterus is surgically absent. No concerning adnexal lesions. Other: Circumferential body wall edema as well as mid mesenteric increased attenuation. No free fluid. No free air. No bowel containing hernias. Musculoskeletal: Diffuse muscular atrophy, likely senescent or related to deconditioning. Split deformity of the L3 vertebral body 60% central height loss, new since comparison August 2016. Age indeterminate on this exam though favor remote. IMPRESSION: 1. Interstitial pulmonary edema with trace bilateral effusions. 2. Split deformity of the L3 vertebral body, AOSpine A2 ,60% central height loss, new since comparison August 2016. Age indeterminate though favor remote. Recommend correlation with point tenderness. 3. Features of mild anasarca with circumferential body wall edema as well as likely mild been mesenteric edema 4. Fatty replacement of the pancreas. 5. Renal atrophy. 6. Prior hysterectomy, appendectomy, cholecystectomy. 7.  Aortic Atherosclerosis (ICD10-I70.0). 8.  Emphysema (ICD10-J43.9). 9. Extensive coronary atherosclerosis. Electronically Signed   By: Lovena Le M.D.   On: 05/10/2019 19:14   DG Chest Port 1 View  Result Date: 05/10/2019 CLINICAL DATA:  Shortness of breath, generalized weakness, just finished antibiotics for UTI yesterday, hypertension, smoker EXAM: PORTABLE CHEST 1 VIEW COMPARISON:  Portable exam 1717 hours compared to 06/29/2015 FINDINGS: RIGHT subclavian transvenous pacemaker with leads projecting over RIGHT atrium and RIGHT ventricle. Normal heart size, mediastinal contours, and pulmonary vascularity. Atherosclerotic calcification aorta. Emphysematous changes with minimal central peribronchial thickening consistent with COPD. Biapical scarring greater on RIGHT. No definite pulmonary infiltrate, pleural effusion or pneumothorax. Bones demineralized. IMPRESSION: COPD  changes without acute abnormalities. Aortic Atherosclerosis (ICD10-I70.0). Electronically Signed   By: Lavonia Dana M.D.   On: 05/10/2019 17:51    Procedures .Critical Care Performed by: Dorie Rank, MD Authorized by: Dorie Rank, MD   Critical care provider statement:    Critical care time (minutes):  45   Critical care was time spent personally by me on the following activities:  Discussions with consultants, evaluation of patient's response to treatment, examination of patient, ordering and performing treatments and interventions, ordering and review of laboratory studies, ordering and review of radiographic studies, pulse oximetry, re-evaluation of patient's condition, obtaining history from patient or surrogate and review of old charts   (including critical care time)  Medications Ordered in ED Medications  heparin ADULT infusion 100 units/mL (25000 units/263mL sodium chloride 0.45%) (900 Units/hr Intravenous New Bag/Given 05/10/19 1941)  lactated ringers bolus 1,000 mL (0 mLs Intravenous Stopped 05/10/19 1745)    And  lactated ringers bolus 500 mL (0 mLs Intravenous Stopped 05/10/19 1847)    And  lactated ringers bolus 250 mL (0 mLs Intravenous Stopped 05/10/19 1846)  ceFEPIme (MAXIPIME) 2 g in sodium chloride 0.9 % 100 mL IVPB (0 g Intravenous Stopped 05/10/19 1848)  vancomycin (VANCOCIN) IVPB 1000 mg/200 mL premix (0 mg Intravenous Stopped 05/10/19 2106)  albuterol (VENTOLIN HFA) 108 (90 Base) MCG/ACT inhaler 4 puff (4 puffs Inhalation Given 05/10/19 1915)  heparin bolus via infusion 3,000 Units (3,000 Units Intravenous Bolus from Bag 05/10/19 1940)    ED Course  I have reviewed the triage vital signs and the nursing notes.  Pertinent labs & imaging results that were available during my care of the patient were reviewed by me and considered in my medical decision making (see chart for details).  Clinical Course as of May 09 2109  Nancy Fetter May 10, 2019  1802 Chest x-ray shows COPD.   No  signs of pneumonia.  Patient continues to complain of shortness of breath and abdominal discomfort.  I am concerned about the possible pulmonary embolism.  I will order a CT angiogram.  Considering her abdominal pain I will order abdominal pelvic CT as well   [JK]  1811 Blood pressure is improving slightly although patient remains tachypneic and hypotensive.  Code sepsis protocol was initiated.  CT scans are pending   [JK]  1811 Patient does not have leukocytosis.  She is not anemic.   [JK]  1855 Patient's D-dimer is elevated.  This is concerning for the possibility of pulmonary embolism.  She does remain tachypneic.  I will start her empirically on heparin considering she is not anemic and her chest x-ray does not show an alternative reason for her dyspnea   [JK]  1908 Attempted to contact pt's daughter.   [JK]  1908 Notified that pt's rapid covid test is negative.   [JK]  1924 Resp rate improving.   Oxygen stable.     [JK]  1926 I called lab to check on troponin.  They notified me that troponin is 1644.   [JK]  1934 Reassessed patient.  Possibly some mild crackles on exam.  She still is tachypneic   [JK]  1948 CT scan findings reviewed.  Possible interstitial edema   [JK]  2021 Discussed with cards .  Could consider echo.  Will consult   admit to [facility]   [JK]  2027 ABG suggests metabolic and resp acidosis   [JK]  2034 Pt placed back on o2.  Sats in the mid 90s   [JK]  2056 I was able to contact pt's daughter and review the findings.   [JK]    Clinical Course User Index [JK] Linwood Dibbles, MD   MDM Rules/Calculators/A&P                      Patient presented to the emergency room initially with concerns of sepsis.  Patient had been diagnosed with a urinary tract infection this past week.  Patient was significantly hypotensive but has responded to a 30 cc/kg bolus.  However there is no clear source of infection at this time.  Sepsis is certainly still a possibility and I am not  certain.  She does not have a white count.  Her initial lactic acid level was normal.  She does not have pneumonia and her urinalysis did not show infection.  I am concerned about the possibility of pulmonary embolism considering her elevated D-dimer and troponin.  Myocardial ischemia and injury is also concerned but there is no definite ST elevation or depression on her EKG.  Patient had noncontrasted CT scans because of her BUN and creatinine.  There are no definitive findings on that.  Think the patient would benefit possibly with echocardiogram and possibly a VQ scan will take an extended period of time.  Patient was started on empiric heparin for pulmonary embolism.  She is more stable now but I have consulted with cardiology and critical care.  Patient will be admitted to the hospital for further treatment.   Final Clinical Impression(s) / ED Diagnoses Final diagnoses:  Myocardial injury, initial encounter  Dyspnea, unspecified type  Shock Berstein [facility])      Linwood Dibbles, MD 05/10/19 2110

## 2019-05-11 ENCOUNTER — Inpatient Hospital Stay (HOSPITAL_COMMUNITY): Payer: Medicare HMO

## 2019-05-11 ENCOUNTER — Encounter (HOSPITAL_COMMUNITY): Payer: Self-pay | Admitting: Internal Medicine

## 2019-05-11 DIAGNOSIS — I340 Nonrheumatic mitral (valve) insufficiency: Secondary | ICD-10-CM

## 2019-05-11 DIAGNOSIS — R6520 Severe sepsis without septic shock: Secondary | ICD-10-CM

## 2019-05-11 DIAGNOSIS — I350 Nonrheumatic aortic (valve) stenosis: Secondary | ICD-10-CM

## 2019-05-11 DIAGNOSIS — A419 Sepsis, unspecified organism: Principal | ICD-10-CM

## 2019-05-11 DIAGNOSIS — N179 Acute kidney failure, unspecified: Secondary | ICD-10-CM

## 2019-05-11 DIAGNOSIS — N189 Chronic kidney disease, unspecified: Secondary | ICD-10-CM | POA: Diagnosis present

## 2019-05-11 DIAGNOSIS — R778 Other specified abnormalities of plasma proteins: Secondary | ICD-10-CM | POA: Diagnosis present

## 2019-05-11 HISTORY — DX: Chronic kidney disease, unspecified: N17.9

## 2019-05-11 HISTORY — DX: Acute kidney failure, unspecified: N18.9

## 2019-05-11 LAB — HEMOGLOBIN A1C
Hgb A1c MFr Bld: 5.3 % (ref 4.8–5.6)
Mean Plasma Glucose: 105.41 mg/dL

## 2019-05-11 LAB — HEPARIN LEVEL (UNFRACTIONATED)
Heparin Unfractionated: >2.2 IU/mL — ABNORMAL HIGH (ref 0.30–0.70)
Heparin Unfractionated: 1 IU/mL — ABNORMAL HIGH (ref 0.30–0.70)
Heparin Unfractionated: 1.08 IU/mL — ABNORMAL HIGH (ref 0.30–0.70)

## 2019-05-11 LAB — BASIC METABOLIC PANEL
Anion gap: 9 (ref 5–15)
BUN: 28 mg/dL — ABNORMAL HIGH (ref 8–23)
CO2: 25 mmol/L (ref 22–32)
Calcium: 9.1 mg/dL (ref 8.9–10.3)
Chloride: 106 mmol/L (ref 98–111)
Creatinine, Ser: 2.03 mg/dL — ABNORMAL HIGH (ref 0.44–1.00)
GFR calc Af Amer: 25 mL/min — ABNORMAL LOW (ref >60)
GFR calc non Af Amer: 21 mL/min — ABNORMAL LOW (ref >60)
Glucose, Bld: 134 mg/dL — ABNORMAL HIGH (ref 70–99)
Potassium: 4.8 mmol/L (ref 3.5–5.1)
Sodium: 140 mmol/L (ref 135–145)

## 2019-05-11 LAB — LIPID PANEL
Cholesterol: 136 mg/dL (ref 0–200)
HDL: 49 mg/dL (ref >40)
LDL Cholesterol: 74 mg/dL (ref 0–99)
Total CHOL/HDL Ratio: 2.8 RATIO
Triglycerides: 67 mg/dL (ref <150)
VLDL: 13 mg/dL (ref 0–40)

## 2019-05-11 LAB — CBC
HCT: 38 % (ref 36.0–46.0)
Hemoglobin: 11.8 g/dL — ABNORMAL LOW (ref 12.0–15.0)
MCH: 31.6 pg (ref 26.0–34.0)
MCHC: 31.1 g/dL (ref 30.0–36.0)
MCV: 101.6 fL — ABNORMAL HIGH (ref 80.0–100.0)
Platelets: 197 10*3/uL (ref 150–400)
RBC: 3.74 MIL/uL — ABNORMAL LOW (ref 3.87–5.11)
RDW: 14.4 % (ref 11.5–15.5)
WBC: 12.8 10*3/uL — ABNORMAL HIGH (ref 4.0–10.5)
nRBC: 0 % (ref 0.0–0.2)

## 2019-05-11 LAB — PHOSPHORUS: Phosphorus: 3.5 mg/dL (ref 2.5–4.6)

## 2019-05-11 LAB — ECHOCARDIOGRAM COMPLETE
Height: 64 in
Weight: 2091.72 oz

## 2019-05-11 LAB — TROPONIN I (HIGH SENSITIVITY)
Troponin I (High Sensitivity): 1630 ng/L (ref <18)
Troponin I (High Sensitivity): 2369 ng/L (ref <18)

## 2019-05-11 LAB — URINE CULTURE: Culture: NO GROWTH

## 2019-05-11 LAB — CREATININE, URINE, RANDOM: Creatinine, Urine: 81.11 mg/dL

## 2019-05-11 LAB — GLUCOSE, CAPILLARY: Glucose-Capillary: 138 mg/dL — ABNORMAL HIGH (ref 70–99)

## 2019-05-11 LAB — MAGNESIUM: Magnesium: 1.8 mg/dL (ref 1.7–2.4)

## 2019-05-11 LAB — CORTISOL: Cortisol, Plasma: 62.3 ug/dL

## 2019-05-11 LAB — NA AND K (SODIUM & POTASSIUM), RAND UR
Potassium Urine: 27 mmol/L
Sodium, Ur: 84 mmol/L

## 2019-05-11 LAB — MRSA PCR SCREENING: MRSA by PCR: NEGATIVE

## 2019-05-11 MED ORDER — HEPARIN (PORCINE) 25000 UT/250ML-% IV SOLN
700.0000 [IU]/h | INTRAVENOUS | Status: DC
  Administered 2019-05-11: 700 [IU]/h via INTRAVENOUS

## 2019-05-11 MED ORDER — LACTATED RINGERS IV SOLN: INTRAVENOUS | Status: DC

## 2019-05-11 MED ORDER — NON FORMULARY: 3.0000 mg | Freq: Every evening | Status: DC | PRN

## 2019-05-11 MED ORDER — LACTATED RINGERS IV BOLUS
500.0000 mL | Freq: Once | INTRAVENOUS | Status: AC
  Administered 2019-05-11: 500 mL via INTRAVENOUS

## 2019-05-11 MED ORDER — HEPARIN (PORCINE) 25000 UT/250ML-% IV SOLN
600.0000 [IU]/h | INTRAVENOUS | Status: DC
  Administered 2019-05-11 – 2019-05-12 (×2): 500 [IU]/h via INTRAVENOUS
  Filled 2019-05-11: qty 250

## 2019-05-11 MED ORDER — LACTATED RINGERS IV BOLUS: 500.0000 mL | Freq: Once | INTRAVENOUS | Status: AC

## 2019-05-11 MED ORDER — MELATONIN 3 MG PO TABS
3.0000 mg | ORAL_TABLET | Freq: Every evening | ORAL | Status: DC | PRN
  Administered 2019-05-11: 3 mg via ORAL
  Filled 2019-05-11: qty 1

## 2019-05-11 MED ORDER — ALBUMIN HUMAN 5 % IV SOLN
25.0000 g | Freq: Once | INTRAVENOUS | Status: AC
  Administered 2019-05-11: 19:00:00 25 g via INTRAVENOUS
  Filled 2019-05-11: qty 500

## 2019-05-11 MED ORDER — ALBUMIN HUMAN 25 % IV SOLN: 25.0000 g | Freq: Four times a day (QID) | INTRAVENOUS | Status: DC

## 2019-05-11 MED ORDER — ORAL CARE MOUTH RINSE
15.0000 mL | Freq: Two times a day (BID) | OROMUCOSAL | Status: DC
  Administered 2019-05-11 – 2019-05-14 (×5): 15 mL via OROMUCOSAL

## 2019-05-11 MED ORDER — CHLORHEXIDINE GLUCONATE CLOTH 2 % EX PADS
6.0000 | MEDICATED_PAD | Freq: Every day | CUTANEOUS | Status: DC
  Administered 2019-05-11 – 2019-05-14 (×3): 6 via TOPICAL

## 2019-05-11 NOTE — Progress Notes (Signed)
eLink Physician-Brief Progress Note Patient Name: [name] DOB: [birth year] MRN: [id number]   Date of Service  05/11/2019  HPI/Events of Note  Mild asymptomatic hypotension  eICU Interventions  LR bolus x 1     Intervention Category Intermediate Interventions: Hypotension - evaluation and management  [name] 05/11/2019, 4:14 AM

## 2019-05-11 NOTE — Progress Notes (Signed)
NAME:  [name], MRN:  [id number], DOB:  Jul 08, 1927, LOS: 1 ADMISSION DATE:  05/10/2019, CONSULTATION DATE:  05/10/2019 REFERRING MD:  Tomi Bamberger, CHIEF COMPLAINT:  SOB  Brief History   85 y/o F who presented 12/27 with reports of pre-syncopal symptoms and hypotension.  Diagnosed with UTI two weeks prior to admit and was dizzy at that time. Treated with Macrobid.  Urine culture grew klebsiella which was resistant to Macrobid and she was changed to Bactrim DS on 12/22.  She took the last dose of Bactrim on 12/27 but felt dizzy and was afraid she would fall at home (lives alone).  She has been on lasix at home for LE swelling. Found to be hypotensive on admit, treated with 2L LR with improvement in BP.  ABG 7.26 / 50 / 65 / 22.  She was noted to have an AKI (baseline 1.23 > 1.85), elevated troponin (1644 > 1827) and elevated lactate concerning for sepsis.  CT chest, abd, pelvis completed which showed interstitial pulmonary edema, trace effusions, possible old T3 fracture but otherwise unremarkable.  UA negative.   Past Medical History  CKD Sick sinus syndrome status post pacemaker placement UTI -   Significant Hospital Events   12/27 Admit   Consults:  Cardiology  Procedures:    Significant Diagnostic Tests:  CT Chest/ABD/Pelvis 12/27 >> interstitial pulmonary edema, trace bilateral effusions, mild anasarca / body wall edema, renal atrophy, emphysema, possible old T3 fracture  Micro Data:  COVID PCR 12/27 >> negative  Flu A/B 12/27 >> negative  BCx2 12/27 >>  UC 12/27 >>   Antimicrobials:  Vancomycin x1 12/27 Cefepime 12/27 >>  Interim history/subjective:  Tmax 98.2 RN reports rising troponin > 1,630 > 2,369  Requiring 2L [place] O2 I/O - not recorded 12/28 Pt reports she has been feeling dizzy for approximately one month.  States she is "is almost 57 and is just tired"  Objective   Blood pressure (!) 92/42, pulse 69, temperature 98.2 F (36.8 C), temperature source Oral, resp.  rate (!) 24, height 5\' 4"  (1.626 m), weight 59.3 kg, SpO2 100 %.    FiO2 (%):  [28 %] 28 %   Intake/Output Summary (Last 24 hours) at 05/11/2019 1057 Last data filed at 05/11/2019 9024 Gross per 24 hour  Intake 827.19 ml  Output --  Net 827.19 ml   Filed Weights   05/10/19 2035 05/11/19 0314  Weight: 53.5 kg 59.3 kg    Examination: General: pleasant thin elderly female lying in bed in NAD  HEENT: MM pink/dry, [place] O2, no jvd Neuro: AAOx4, speech clear, MAE CV: s1s2 RRR, 2-3/6 SEM noted PULM:  Tachypnea but non-labored, lungs bilaterally clear GI: soft, bsx4 active  Extremities: warm/dry, no edema  Skin: no rashes or lesions  Resolved Hospital Problem list     Assessment & Plan:   Hypotension   Rule Out Sepsis  Recent treatment for klebsiella UTI (resistant to macrobid, transitioned to bactrim & completed dosing).  On lasix at home with poor PO intake.  Lactic acid cleared with IVF's.  Patient appears dry on exam (? Poor intake with ongoing diuresis).  Received ~2750 LR. No other clear source infection.  -Additional LR 500 ml bolus x1 now.   -SDU monitoring  -await ECHO with ? New murmur, ?LVOT obstruction.  Hold pressors for now -follow cultures -MAP goal >65 -hold home antihypertensives   Acute Hypoxemic Hypercarbic Respiratory Failure  -wean O2 for sats >90% -push pulmonary hygiene - IS, mobilize  Acute Kidney Injury on CKD Admission creatinine 1.85 with baseline 1.23.  Likely multifactorial in setting of UTI, abx use, poor PO intake.  -Trend BMP / urinary output -Replace electrolytes as indicated -Avoid nephrotoxic agents, ensure adequate renal perfusion -I/O cath this am to assess for urine  Troponin Elevation  Suspect demand ischemia.   -appreciate Cardiology evaluation  -continue heparin gtt  -tele monitoring   Interstitial Thickening noted on CT Chest Trace Pleural Effusions May be mild edema but she was also treated with macrobid which can cause  interstitial pneumonitis.  On personal review, mild thickening on exam -hold lasix for now -O2 if needed to maintain sats >90%  Emphysema / COPD Without acute exacerbation -continue duoneb Q6 with PRN albuterol   HOH  Macular Degeneration -supportive care  Best practice:  Diet: full liquid advance as tolerated Pain/Anxiety/Delirium protocol (if indicated): N/A VAP protocol (if indicated): N/A DVT prophylaxis: Heparin IV GI prophylaxis: N/A Glucose control: N/A Mobility: As tolerated Code Status: DNR/DNI after discussion with patient Family Communication: Daughter - Marge Vandermeulen [phone] Disposition: ICU  Labs   CBC: Recent Labs  Lab 05/10/19 1707 05/11/19 0453  WBC 9.4 12.8*  NEUTROABS 6.6  --   HGB 12.5 11.8*  HCT 39.8 38.0  MCV 100.0 101.6*  PLT 201 197    Basic Metabolic Panel: Recent Labs  Lab 05/10/19 1707 05/11/19 0453  NA 140 140  K 4.3 4.8  CL 105 106  CO2 26 25  GLUCOSE 105* 134*  BUN 25* 28*  CREATININE 1.85* 2.03*  CALCIUM 9.2 9.1  MG  --  1.8  PHOS  --  3.5   GFR: Estimated Creatinine Clearance: 16.2 mL/min (A) (by C-G formula based on SCr of 2.03 mg/dL (H)). Recent Labs  Lab 05/10/19 1707 05/10/19 1708 05/10/19 1901 05/10/19 2252 05/11/19 0453  WBC 9.4  --   --   --  12.8*  LATICACIDVEN  --  1.8 3.1* 1.7  --     Liver Function Tests: Recent Labs  Lab 05/10/19 1707  AST 28  ALT 9  ALKPHOS 42  BILITOT 0.8  PROT 6.2*  ALBUMIN 3.6   No results for input(s): LIPASE, AMYLASE in the last 168 hours. No results for input(s): AMMONIA in the last 168 hours.  ABG    Component Value Date/Time   PHART 7.267 (L) 05/10/2019 1940   PCO2ART 50.4 (H) 05/10/2019 1940   PO2ART 65.0 (L) 05/10/2019 1940   HCO3 22.3 05/10/2019 1940   ACIDBASEDEF 4.7 (H) 05/10/2019 1940   O2SAT 89.5 05/10/2019 1940     Coagulation Profile: Recent Labs  Lab 05/10/19 1707  INR 1.1    Cardiac Enzymes: No results for input(s): CKTOTAL, CKMB,  CKMBINDEX, TROPONINI in the last 168 hours.  HbA1C: Hgb A1c MFr Bld  Date/Time Value Ref Range Status  05/11/2019 04:59 AM 5.3 4.8 - 5.6 % Final    Comment:    (NOTE) Pre diabetes:          5.7%-6.4% Diabetes:              >6.4% Glycemic control for   <7.0% adults with diabetes     CBG: No results for input(s): GLUCAP in the last 168 hours.      Critical care time: n/a    Canary Brim, MSN, NP-C  Pulmonary & Critical Care 05/11/2019, 10:57 AM   Please see [email or web address] for pager details.

## 2019-05-11 NOTE — Progress Notes (Signed)
[place] for IV heparin Indication: pulmonary embolus  Allergies  Allergen Reactions  . Ciprofloxacin Hives and Other (See Comments)    Got hives after 10 days of both cipro and flagyl, not sure which caused the hives  . Flagyl [Metronidazole] Hives and Other (See Comments)    Got hives after 10 days of both cipro and flagyl, not sure which caused the hives  . Morphine And Related Nausea And Vomiting    Patient Measurements: Height: 5\' 4"  (162.6 cm) Weight: 130 lb 11.7 oz (59.3 kg) IBW/kg (Calculated) : 54.7 Heparin Dosing Weight: TBW  Vital Signs: Temp: 97.7 F (36.5 C) (12/28 0314) Temp Source: Oral (12/28 0314) BP: 87/50 (12/28 0404) Pulse Rate: 70 (12/28 0404)  Labs: Recent Labs    05/10/19 1707 05/10/19 1901 05/11/19 0009 05/11/19 0130  HGB 12.5  --   --   --   HCT 39.8  --   --   --   PLT 201  --   --   --   APTT 23*  --   --   --   LABPROT 13.7  --   --   --   INR 1.1  --   --   --   HEPARINUNFRC  --   --   --  >2.20*  CREATININE 1.85*  --   --   --   TROPONINIHS 1,644* 1,827* 1,630*  --     Estimated Creatinine Clearance: 17.8 mL/min (A) (by C-G formula based on SCr of 1.85 mg/dL (H)).   Medical History: Past Medical History:  Diagnosis Date  . Chest wall pain 2003   atypical, now resolved  . CKD (chronic kidney disease), stage III    Cr 1.27, GFR 40 (07/2011)  . Diverticulitis    with DT flare in 6/09  . Gout    R hand  . Hypertension   . Hypertriglyceridemia   . Macular degeneration    Dr. Peter Garter  . Pacemaker    sees Dr. Recardo Evangelist [facility]) annually. Pacemaker replaced 2007 Tami Ribas)  . Sick sinus syndrome (HCC) with bradycardia  . Tobacco use disorder    quit 01/2013--restarted in 2017  . Urge incontinence     Medications:  Medications Prior to Admission  Medication Sig Dispense Refill Last Dose  . acetaminophen (TYLENOL) 500 MG tablet Take 1,000 mg by mouth every 6 (six) hours as needed.    Past Month  at Unknown time  . aspirin EC 81 MG tablet Take 81 mg by mouth daily.   05/09/2019 at Unknown time  . calcium carbonate (OS-CAL) 600 MG TABS Take 600 mg by mouth daily. With Vitamin D   05/10/2019 at Unknown time  . furosemide (LASIX) 40 MG tablet TAKE 2 TABLETS ( 80 MG ) DAILY *NEEDS OFFICE VISIT FOR FURTHER REFILLS* (Patient taking differently: Take 40 mg by mouth daily. ) 180 tablet 0 05/09/2019 at Unknown time  . loratadine (CLARITIN) 10 MG tablet Take 10 mg by mouth daily.   05/09/2019 at Unknown time  . metoprolol tartrate (LOPRESSOR) 25 MG tablet TAKE 1 TABLET BY MOUTH TWICE A DAY (Patient taking differently: Take 12.5-25 mg by mouth 2 (two) times daily. 12.5mg  in am 25mg  in pm) 180 tablet 2 05/08/2019 at unk  . Multiple Vitamins-Minerals (OCUVITE PRESERVISION PO) Take 1 tablet by mouth daily.   05/09/2019 at Unknown time  . Omega-3 Fatty Acids (FISH OIL) 1000 MG CAPS Take 2,000 mg by mouth daily.   05/09/2019  at Unknown time  . potassium chloride (K-DUR) 10 MEQ tablet Take 20 mEq by mouth daily as needed.  90 tablet 3 Past Month at Unknown time  . sulfamethoxazole-trimethoprim (BACTRIM DS) 800-160 MG tablet Take 1 tablet by mouth 2 (two) times daily. 14 tablet 0 05/10/2019 at Unknown time   Scheduled:  . aspirin EC  81 mg Oral Daily  . ipratropium-albuterol  3 mL Nebulization Q6H   Assessment: 52 yoF presenting with hypotension; concern for possible PE with elevated troponin an D-dimer (POC COVID Ag test reportedly negative). Unable to do CTA d/t AKI; may get echo or VQ scan, but Pharmacy consulted to start heparin infusion for PE r/o in the meantime.   Baseline INR, aPTT: WNL  Prior anticoagulation: none, ASA only PTA  Significant events:  12/27  CBC: WNL  No bleeding or infusion issues per nursing  SCr elevated on admission Today, 12/28  0130 HL = >2.20 confirmed with RN drawn on same side that heparin drip was running. Lab will redraw level now.   0453 HL = 1.00, no  bleeding or infusion issues per RN.          Goal of Therapy: Heparin level 0.3-0.7 units/ml Monitor platelets by anticoagulation protocol: Yes  Plan:  Decrease heparin drip to 700 units/hr  Recheck heparin level 8 hrs after start  Daily CBC, daily heparin level once stable  Monitor for signs of bleeding or thrombosis  F/u any additional imaging for PE r/o or confirmation   Lorenza Evangelist 05/11/2019, 4:29 AM

## 2019-05-11 NOTE — Progress Notes (Signed)
  Echocardiogram 2D Echocardiogram has been performed.  Matilde Bash 05/11/2019, 12:32 PM

## 2019-05-11 NOTE — Progress Notes (Signed)
ANTICOAGULATION CONSULT NOTE  Pharmacy Consult for IV heparin Indication: pulmonary embolus  Allergies  Allergen Reactions  . Ciprofloxacin Hives and Other (See Comments)    Got hives after 10 days of both cipro and flagyl, not sure which caused the hives  . Flagyl [Metronidazole] Hives and Other (See Comments)    Got hives after 10 days of both cipro and flagyl, not sure which caused the hives  . Morphine And Related Nausea And Vomiting    Patient Measurements: Height: 5\' 4"  (162.6 cm) Weight: 130 lb 11.7 oz (59.3 kg) IBW/kg (Calculated) : 54.7 Heparin Dosing Weight: n/a. TBW = 59 kg  Vital Signs: Temp: 98.1 F (36.7 C) (12/28 1200) Temp Source: Oral (12/28 1200) BP: 108/46 (12/28 1500) Pulse Rate: 98 (12/28 1500)  Labs: Recent Labs    05/10/19 1707 05/10/19 1901 05/11/19 0009 05/11/19 0130 05/11/19 0453 05/11/19 1423  HGB 12.5  --   --   --  11.8*  --   HCT 39.8  --   --   --  38.0  --   PLT 201  --   --   --  197  --   APTT 23*  --   --   --   --   --   LABPROT 13.7  --   --   --   --   --   INR 1.1  --   --   --   --   --   HEPARINUNFRC  --   --   --  >2.20* 1.00* 1.08*  CREATININE 1.85*  --   --   --  2.03*  --   TROPONINIHS 1,644* 1,827* 1,630*  --  2,369*  --     Estimated Creatinine Clearance: 16.2 mL/min (A) (by C-G formula based on SCr of 2.03 mg/dL (H)).   Medical History: Past Medical History:  Diagnosis Date  . Acute kidney injury superimposed on chronic kidney disease (HCC) 05/11/2019  . Chest wall pain 2003   atypical, now resolved  . CKD (chronic kidney disease), stage III    Cr 1.27, GFR 40 (07/2011)  . Diverticulitis    with DT flare in 6/09  . Gout    R hand  . Hypertension   . Hypertriglyceridemia   . Macular degeneration    Dr. 8/09  . Pacemaker 1994   MDT dual lead PPM, sees Dr. Sharlot Gowda annually. Last gen change 10/2017  . Sick sinus syndrome (HCC)    with bradycardia, has [employer] pacemaker  . Tobacco use disorder    quit  01/2013--restarted in 2017  . Urge incontinence     Medications:  Not on anticoagulants PTA  Assessment: 25 yoF presenting with hypotension; concern for possible PE with elevated troponin an D-dimer (POC COVID Ag test reportedly negative). Unable to do CTA d/t AKI; may ECHO done 12/28. Pharmacy consulted to dose heparin for suspected PE.   Baseline INR, aPTT, CBC: WNL  Prior anticoagulation: none, ASA only PTA  SCr elevated on admission  Significant events: -12/28: ECHO  Today, 05/11/19  HL = 1.08 remains elevated on heparin infusion of 700 units/hr  Confirmed with RN that heparin infusing at correct rate with no issues/interruptions. No signs of bleeding.   Heparin infusing into left wrist, confirmed with phlebotomy that HL obtained from right arm.  SCr 2, CrCL ~ 16 mL/min. Worsening renal function, AKI on CKD  CBC: Hgb and Plt stable  Goal of Therapy: Heparin level 0.3-0.7 units/ml Monitor platelets  by anticoagulation protocol: Yes  Plan:  Hold heparin infusion for 1 hour  Decrease heparin infusion to 500 units/hr  Check heparin level in 8 hrs  Daily CBC, daily heparin level once stable  Monitor for signs of bleeding  Lenis Noon, PharmD 05/11/2019, 3:48 PM

## 2019-05-11 NOTE — Consult Note (Addendum)
Cardiology Consultation:   Patient ID: [name]; [id number]; 1927-07-09   Admit date: 05/10/2019 Date of Consult: 05/11/2019  Primary Care Provider: Joselyn Arrow, MD Primary Cardiologist: Thurmon Fair, MD 01/17/2018 Primary Electrophysiologist:  None   Patient Profile:   [name] is a 85 y.o. female with a hx of PAT, SSS s/p MDT PPM 1994 w/ last gen change 10/2017, D-CHF, CKD III, nl MV 2010, recent UTI rx w/ Bactrim, who was admitted 12/27 with presyncope and hypotension and is being seen today for the above plus elevated troponin at the request of Dr Karie Mainland.  History of Present Illness:   [name] is normally 100% A paced, no episodes of Afib on last interrogation in November.   At 06/2018 office visit, she was c/o some burning chest pain, helped by Tylenol. DOE was concerning for CHF exacerbation, wt 133 lbs. Echo w/ E/e' > 15 so Lasix increased to 80 mg qd. BMET 1 week, log wts. Renal function stable w/ Cr 1.23 on 04/02/2019 w/ wt 122 lbs.   Wt on admit 118 lbs, down about 5 pounds for her.  Has been taking Lasix 40 mg daily except for 3 days when she went to 80 or 120 mg daily because of leg swelling.  [name] feels that she was eating and drinking normally prior to admission.  However, she had been dealing with a urinary tract infection for several weeks.  At first, she felt like she was getting it.  Then she did 2 rounds of antibiotics through her PCP, but did not really feel like she was getting better.  However, on admission, a urinalysis was performed that was negative.  For about the last week or more, she has been lightheaded and dizzy.  [place] the [place] with [place], and could not get up off the chair.  EMS was called, but she was not transported.  They told her then that they thought she was dehydrated.   She is aware that she has been losing weight, but does not really know why.  She has not reported chest pain.  She has reported some lower  abdominal pain.  She denies lower extremity swelling, orthopnea, or PND.  She has been weak, and her dyspnea on exertion has been worse than usual.  However, it is hard to tell if this is from the hypotension, or fluid.  EMS gave her 500 cc prior to arrival in the emergency room.  She got additional IV fluids in the emergency room.  She denies fevers or chills.  She denies cough or cold symptoms.  She felt like her breathing got a little better after a breathing treatment, but feels short of breath right now.  She also feels very lightheaded right now.  Blood pressure is 86/32, map of 52.  Covid negative.   Past Medical History:  Diagnosis Date  . Acute kidney injury superimposed on chronic kidney disease (HCC) 05/11/2019  . Chest wall pain 2003   atypical, now resolved  . CKD (chronic kidney disease), stage III    Cr 1.27, GFR 40 (07/2011)  . Diverticulitis    with DT flare in 6/09  . Gout    R hand  . Hypertension   . Hypertriglyceridemia   . Macular degeneration    Dr. Sharlot Gowda  . Pacemaker 1994   MDT dual lead PPM, sees Dr. Rubie Maid annually. Last gen change 10/2017  . Sick sinus syndrome (HCC)    with bradycardia, has  Medtronic pacemaker  . Tobacco use disorder    quit 01/2013--restarted in 2017  . Urge incontinence     Past Surgical History:  Procedure Laterality Date  . ABDOMINAL HYSTERECTOMY  1978   partial  . APPENDECTOMY  1952  . CARDIAC CATHETERIZATION  2000  . CHOLECYSTECTOMY  1998   Dr. Harlow Asa  . COLON SURGERY  11/2009   vaginal-colon fistula repair (Drs. Danne Baxter)  . CYSTOSCOPY  2002   Dr. Reece Agar  . EYE SURGERY  11/2004   cataract surgery b/l - Dr.Bergen  . PACEMAKER INSERTION  1994   Generator changes in 2000, 2007  . PPM GENERATOR CHANGEOUT N/A 10/17/2017   Procedure: PPM GENERATOR CHANGEOUT;  Surgeon: Sanda Klein, MD;  Location: [facility];  Service: Cardiovascular;  Laterality: N/A;  . [place]     Prior to Admission  medications   Medication Sig Start Date End Date Taking? Authorizing Provider  acetaminophen (TYLENOL) 500 MG tablet Take 1,000 mg by mouth every 6 (six) hours as needed.    Yes [provider]  aspirin EC 81 MG tablet Take 81 mg by mouth daily.   Yes [provider]  calcium carbonate (OS-CAL) 600 MG TABS Take 600 mg by mouth daily. With Vitamin D   Yes [provider]  furosemide (LASIX) 40 MG tablet TAKE 2 TABLETS ( 80 MG ) DAILY *NEEDS OFFICE VISIT FOR FURTHER REFILLS* Patient taking differently: Take 40 mg by mouth daily.  04/14/19  Yes Croitoru, Mihai, MD  loratadine (CLARITIN) 10 MG tablet Take 10 mg by mouth daily.   Yes [provider]  metoprolol tartrate (LOPRESSOR) 25 MG tablet TAKE 1 TABLET BY MOUTH TWICE A DAY Patient taking differently: Take 12.5-25 mg by mouth 2 (two) times daily. 12.5mg  in am 25mg  in pm 12/03/18  Yes Croitoru, Mihai, MD  Multiple Vitamins-Minerals (OCUVITE PRESERVISION PO) Take 1 tablet by mouth daily.   Yes [provider]  Omega-3 Fatty Acids (FISH OIL) 1000 MG CAPS Take 2,000 mg by mouth daily.   Yes [provider]  potassium chloride (K-DUR) 10 MEQ tablet Take 20 mEq by mouth daily as needed.  06/26/18  Yes Croitoru, Mihai, MD  sulfamethoxazole-trimethoprim (BACTRIM DS) 800-160 MG tablet Take 1 tablet by mouth 2 (two) times daily. 05/04/19  Yes Rita Ohara, MD    Inpatient Medications: Scheduled Meds: . aspirin EC  81 mg Oral Daily  . Chlorhexidine Gluconate Cloth  6 each Topical Daily  . ipratropium-albuterol  3 mL Nebulization Q6H   Continuous Infusions: . ceFEPime (MAXIPIME) IV    . heparin 700 Units/hr (05/11/19 7619)   PRN Meds: acetaminophen, albuterol, ondansetron (ZOFRAN) IV  Allergies:    Allergies  Allergen Reactions  . Ciprofloxacin Hives and Other (See Comments)    Got hives after 10 days of both cipro and flagyl, not sure which caused the hives  . Flagyl [Metronidazole] Hives and  Other (See Comments)    Got hives after 10 days of both cipro and flagyl, not sure which caused the hives  . Morphine And Related Nausea And Vomiting    Social History:   Social History   Socioeconomic History  . Marital status: Divorced    Spouse name: Not on file  . Number of children: 4  . Years of education: Not on file  . Highest education level: Not on file  Occupational History  . Occupation: retired Psychologist, counselling  Tobacco Use  . Smoking status: Current Some  Day Smoker    Packs/day: 0.50    Years: 40.00    Pack years: 20.00    Last attempt to quit: 01/12/2013    Years since quitting: 6.3  . Smokeless tobacco: Never Used  . Tobacco comment: smokes 1/2 PPD, quite 01/2013, restarted in 2017; smokes more when her daughter who smokes visits  Substance and Sexual Activity  . Alcohol use: No    Alcohol/week: 0.0 standard drinks  . Drug use: No  . Sexual activity: Not Currently  Other Topics Concern  . Not on file  Social History Narrative   Lives alone, no pets (cat died).  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 She has been offered to move to the beach but she doesn't want to.   (She used to visit the elderly at nursing homes)   Social Determinants of Health   Financial Resource Strain:   . Difficulty of Paying Living Expenses: Not on file  Food Insecurity:   . Worried About Programme researcher, broadcasting/film/video in the Last Year: Not on file  . Ran Out of Food in the Last Year: Not on file  Transportation Needs:   . Lack of Transportation (Medical): Not on file  . Lack of Transportation (Non-Medical): Not on file  Physical Activity:   . Days of Exercise per Week: Not on file  . Minutes of Exercise per Session: Not on file  Stress:   . Feeling of Stress : Not on file  Social Connections:   . Frequency of Communication with Friends and Family: Not on file  . Frequency of Social Gatherings with Friends and  Family: Not on file  . Attends Religious Services: Not on file  . Active Member of Clubs or Organizations: Not on file  . Attends Banker Meetings: Not on file  . Marital Status: Not on file  Intimate Partner Violence:   . Fear of Current or Ex-Partner: Not on file  . Emotionally Abused: Not on file  . Physically Abused: Not on file  . Sexually Abused: Not on file    Family History:   Family History  Problem Relation Age of Onset  . Diabetes Mother   . Heart disease Mother   . Hyperlipidemia Mother   . Hypertension Mother   . Heart disease Father        pacemaker, CABG  . Hyperlipidemia Father   . Hypertension Father   . Cancer Father        prostate  . Cancer Sister        breast cancer 80's, then recurrence, metastatic, died at 3  . Diabetes Brother   . Alcohol abuse Brother   . Heart disease Brother   . Crohn's disease Daughter   . Alzheimer's disease Brother 16  . Cancer Brother 84       lymphoma  . Ulcerative colitis Daughter   . Alcohol abuse Daughter        >30 years sober 2019  . Heart disease Other 62  . Stroke Other 62  . Cancer Other        breast   Family Status:  Family Status  Relation Name Status  . Mother  Deceased at age 10  . Father  Deceased at age 33  . Sister  Deceased  . Brother  Deceased at age 42  . Daughter Lahoma Rocker  . Brother  Alive  . Daughter Darel Hong  Alive  . Baxter Kail Alive  . Daughter Jonna Munro       smoker  . Other niece (Not Specified)    ROS:  Please see the history of present illness.  All other ROS reviewed and negative.     Physical Exam/Data:   Vitals:   05/11/19 0636 05/11/19 0700 05/11/19 0800 05/11/19 0824  BP: (!) 94/46 (!) 92/42    Pulse: 70 69    Resp: (!) 25 (!) 24    Temp:   98.2 F (36.8 C)   TempSrc:   Oral   SpO2: 96% 96%  100%  Weight:      Height:        Intake/Output Summary (Last 24 hours) at 05/11/2019 0957 Last data filed at 05/11/2019 4818 Gross per 24 hour  Intake  827.19 ml  Output --  Net 827.19 ml   Filed Weights   05/10/19 2035 05/11/19 0314  Weight: 53.5 kg 59.3 kg   Body mass index is 22.44 kg/m.  General:  Well nourished, well developed, female in no acute distress HEENT: normal Lymph: no adenopathy Neck: JVD -9 cm Endocrine:  No thryomegaly Vascular: No carotid bruits; 4/4 extremity pulses 2+  Cardiac:  normal S1, S2; regular rate and rhythm; 2-3/6 murmur Lungs: Rales bases bilaterally, no wheezing, rhonchi   Abd: soft, nontender, no hepatomegaly  Ext: no edema Musculoskeletal:  No deformities, BUE and BLE strength weak but equal Skin: warm and dry  Neuro:  CNs 2-12 intact, no focal abnormalities noted Psych:  Normal affect   EKG:  The EKG was personally reviewed and demonstrates: Atrial paced rhythm, heart rate 72, no acute ischemic changes Telemetry:  Telemetry was personally reviewed and demonstrates: Atrial paced with some ventricular pacing   CV studies:   ECHO: Ordered  ECHO: 06/25/2018  1. The left ventricle has normal systolic function with an ejection fraction of 60-65%. The cavity size was normal. There is mild asymmetric left ventricular hypertrophy of the basal anteroseptal wall. Left ventricular diastolic Doppler parameters are  consistent with impaired relaxation Elevated mean left atrial pressure.  2. The right ventricle has normal systolic function. The cavity was normal. There is no increase in right ventricular wall thickness.  3. The mitral valve is normal in structure. There is moderate mitral annular calcification present.  4. The tricuspid valve is normal in structure.  5. The aortic valve has an indeterminant number of cusps There is mild thickening and mild calcification of the aortic valve, with mildly decreased cusp excursion.  6. The pulmonic valve was normal in structure.  7. Right atrial pressure is estimated at 3 mmHg.  CATH:    Laboratory Data:   Chemistry Recent Labs  Lab 05/10/19 1707  05/11/19 0453  NA 140 140  K 4.3 4.8  CL 105 106  CO2 26 25  GLUCOSE 105* 134*  BUN 25* 28*  CREATININE 1.85* 2.03*  CALCIUM 9.2 9.1  GFRNONAA 24* 21*  GFRAA 27* 25*  ANIONGAP 9 9    Lab Results  Component Value Date   ALT 9 05/10/2019   AST 28 05/10/2019   ALKPHOS 42 05/10/2019   BILITOT 0.8 05/10/2019   Hematology Recent Labs  Lab 05/10/19 1707 05/11/19 0453  WBC 9.4 12.8*  RBC 3.98 3.74*  HGB 12.5 11.8*  HCT 39.8 38.0  MCV 100.0 101.6*  MCH 31.4 31.6  MCHC 31.4 31.1  RDW 14.0 14.4  PLT 201 197   Cardiac Enzymes High Sensitivity Troponin:  Recent Labs  Lab 05/10/19 1707 05/10/19 1901 05/11/19 0009 05/11/19 0453  TROPONINIHS 1,644* 1,827* 1,630* 2,369*      BNP Recent Labs  Lab 05/10/19 2033  BNP 367.4*    DDimer  Recent Labs  Lab 05/10/19 1707  DDIMER 1.49*   TSH:  Lab Results  Component Value Date   TSH 2.860 04/02/2019   Lipids: Lab Results  Component Value Date   CHOL 136 05/11/2019   HDL 49 05/11/2019   LDLCALC 74 05/11/2019   TRIG 67 05/11/2019   CHOLHDL 2.8 05/11/2019   HgbA1c: Lab Results  Component Value Date   HGBA1C 5.3 05/11/2019   Magnesium:  Magnesium  Date Value Ref Range Status  05/11/2019 1.8 1.7 - 2.4 mg/dL Final    Comment:    Performed at [facility], [address] [place], [place] [address]     Radiology/Studies:  CT ABDOMEN PELVIS WO CONTRAST  Result Date: 05/10/2019 CLINICAL DATA:  Shortness of breath and nonlocalized abdominal pain EXAM: CT CHEST, ABDOMEN AND PELVIS WITHOUT CONTRAST TECHNIQUE: Multidetector CT imaging of the chest, abdomen and pelvis was performed following the standard protocol without IV contrast. COMPARISON:  Chest radiograph 05/10/2019, CT abdomen pelvis 12/14/2014 FINDINGS: CT CHEST FINDINGS Cardiovascular: Right chest wall pacer pack with leads directed towards the right atrium and ventricle. Normal cardiac size. Extensive coronary artery calcium. Trace  pericardial fluid, likely at the upper limits of normal. Extensive atherosclerotic calcification of the thoracic aorta. Aorta is normal caliber. Normal 3 vessel branching of the aortic arch. Extensive calcification of the proximal great vessels. Central pulmonary arteries are the upper limits of normal for size. Luminal evaluation precluded in the absence of contrast. Mediastinum/Nodes: Few scattered low-attenuation subcentimeter mediastinal nodes, nonspecific and potentially reactive or edematous. No pathologically enlarged mediastinal or axillary adenopathy. Hilar lymph nodes difficult to assess in absence of contrast media. Thyroid gland and thoracic inlet is unremarkable. The no acute abnormality of trachea or esophagus. Posterior bowing of the trachea likely related to exhalation during imaging. Lungs/Pleura: There is diffuse interlobular septal thickening as well as peribronchovascular thickening and trace bilateral effusions with adjacent passive and dependent atelectasis. Suspect some underlying emphysematous change though difficult to fully assess underlying parenchyma given extensive respiratory motion. No consolidative opacity. No pneumothorax. Biapical pleuroparenchymal scarring is present. Musculoskeletal: Multilevel degenerative changes are present in the imaged portions of the spine. No acute osseous abnormality or suspicious osseous lesion. Assessment for rib fractures is limited given motion artifact with step artifact on multiplanar reconstruction. No gross rib deformities. CT ABDOMEN PELVIS FINDINGS Hepatobiliary: No focal liver abnormality is seen. Patient is post cholecystectomy. Slight prominence of the biliary tree likely related to reservoir effect. No calcified intraductal gallstones. Pancreas: Extensive fatty replacement of the pancreas. No peripancreatic inflammation or ductal dilatation. Spleen: Normal in size without focal abnormality. Adrenals/Urinary Tract: Normal adrenal glands.  Atrophic kidneys. No visible concerning renal lesions. No urolithiasis or hydronephrosis. Urinary bladder is largely decompressed at the time of exam and therefore poorly evaluated by CT imaging. Stomach/Bowel: Distal esophagus, stomach and duodenal sweep are unremarkable. No small bowel wall thickening or dilatation. No evidence of obstruction. The appendix is surgically absent. No colonic dilatation or wall thickening. Scattered colonic diverticula without focal pericolonic inflammation to suggest diverticulitis. Vascular/Lymphatic: Atherosclerotic plaque within the normal caliber aorta. No suspicious or enlarged lymph nodes in the included lymphatic chains. Reproductive: Small amount of air within the vaginal canal. Uterus is surgically absent. No concerning adnexal lesions. Other: Circumferential  body wall edema as well as mid mesenteric increased attenuation. No free fluid. No free air. No bowel containing hernias. Musculoskeletal: Diffuse muscular atrophy, likely senescent or related to deconditioning. Split deformity of the L3 vertebral body 60% central height loss, new since comparison August 2016. Age indeterminate on this exam though favor remote. IMPRESSION: 1. Interstitial pulmonary edema with trace bilateral effusions. 2. Split deformity of the L3 vertebral body, AOSpine A2 ,60% central height loss, new since comparison August 2016. Age indeterminate though favor remote. Recommend correlation with point tenderness. 3. Features of mild anasarca with circumferential body wall edema as well as likely mild been mesenteric edema 4. Fatty replacement of the pancreas. 5. Renal atrophy. 6. Prior hysterectomy, appendectomy, cholecystectomy. 7.  Aortic Atherosclerosis (ICD10-I70.0). 8.  Emphysema (ICD10-J43.9). 9. Extensive coronary atherosclerosis. Electronically Signed   By: Kreg Shropshire M.D.   On: 05/10/2019 19:14   CT CHEST WO CONTRAST  Result Date: 05/10/2019 CLINICAL DATA:  Shortness of breath and  nonlocalized abdominal pain EXAM: CT CHEST, ABDOMEN AND PELVIS WITHOUT CONTRAST TECHNIQUE: Multidetector CT imaging of the chest, abdomen and pelvis was performed following the standard protocol without IV contrast. COMPARISON:  Chest radiograph 05/10/2019, CT abdomen pelvis 12/14/2014 FINDINGS: CT CHEST FINDINGS Cardiovascular: Right chest wall pacer pack with leads directed towards the right atrium and ventricle. Normal cardiac size. Extensive coronary artery calcium. Trace pericardial fluid, likely at the upper limits of normal. Extensive atherosclerotic calcification of the thoracic aorta. Aorta is normal caliber. Normal 3 vessel branching of the aortic arch. Extensive calcification of the proximal great vessels. Central pulmonary arteries are the upper limits of normal for size. Luminal evaluation precluded in the absence of contrast. Mediastinum/Nodes: Few scattered low-attenuation subcentimeter mediastinal nodes, nonspecific and potentially reactive or edematous. No pathologically enlarged mediastinal or axillary adenopathy. Hilar lymph nodes difficult to assess in absence of contrast media. Thyroid gland and thoracic inlet is unremarkable. The no acute abnormality of trachea or esophagus. Posterior bowing of the trachea likely related to exhalation during imaging. Lungs/Pleura: There is diffuse interlobular septal thickening as well as peribronchovascular thickening and trace bilateral effusions with adjacent passive and dependent atelectasis. Suspect some underlying emphysematous change though difficult to fully assess underlying parenchyma given extensive respiratory motion. No consolidative opacity. No pneumothorax. Biapical pleuroparenchymal scarring is present. Musculoskeletal: Multilevel degenerative changes are present in the imaged portions of the spine. No acute osseous abnormality or suspicious osseous lesion. Assessment for rib fractures is limited given motion artifact with step artifact on  multiplanar reconstruction. No gross rib deformities. CT ABDOMEN PELVIS FINDINGS Hepatobiliary: No focal liver abnormality is seen. Patient is post cholecystectomy. Slight prominence of the biliary tree likely related to reservoir effect. No calcified intraductal gallstones. Pancreas: Extensive fatty replacement of the pancreas. No peripancreatic inflammation or ductal dilatation. Spleen: Normal in size without focal abnormality. Adrenals/Urinary Tract: Normal adrenal glands. Atrophic kidneys. No visible concerning renal lesions. No urolithiasis or hydronephrosis. Urinary bladder is largely decompressed at the time of exam and therefore poorly evaluated by CT imaging. Stomach/Bowel: Distal esophagus, stomach and duodenal sweep are unremarkable. No small bowel wall thickening or dilatation. No evidence of obstruction. The appendix is surgically absent. No colonic dilatation or wall thickening. Scattered colonic diverticula without focal pericolonic inflammation to suggest diverticulitis. Vascular/Lymphatic: Atherosclerotic plaque within the normal caliber aorta. No suspicious or enlarged lymph nodes in the included lymphatic chains. Reproductive: Small amount of air within the vaginal canal. Uterus is surgically absent. No concerning adnexal lesions. Other: Circumferential body wall edema  as well as mid mesenteric increased attenuation. No free fluid. No free air. No bowel containing hernias. Musculoskeletal: Diffuse muscular atrophy, likely senescent or related to deconditioning. Split deformity of the L3 vertebral body 60% central height loss, new since comparison August 2016. Age indeterminate on this exam though favor remote. IMPRESSION: 1. Interstitial pulmonary edema with trace bilateral effusions. 2. Split deformity of the L3 vertebral body, AOSpine A2 ,60% central height loss, new since comparison August 2016. Age indeterminate though favor remote. Recommend correlation with point tenderness. 3. Features of  mild anasarca with circumferential body wall edema as well as likely mild been mesenteric edema 4. Fatty replacement of the pancreas. 5. Renal atrophy. 6. Prior hysterectomy, appendectomy, cholecystectomy. 7.  Aortic Atherosclerosis (ICD10-I70.0). 8.  Emphysema (ICD10-J43.9). 9. Extensive coronary atherosclerosis. Electronically Signed   By: Kreg ShropshirePrice  DeHay M.D.   On: 05/10/2019 19:14   DG Chest Port 1 View  Result Date: 05/10/2019 CLINICAL DATA:  Shortness of breath, generalized weakness, just finished antibiotics for UTI yesterday, hypertension, smoker EXAM: PORTABLE CHEST 1 VIEW COMPARISON:  Portable exam 1717 hours compared to 06/29/2015 FINDINGS: RIGHT subclavian transvenous pacemaker with leads projecting over RIGHT atrium and RIGHT ventricle. Normal heart size, mediastinal contours, and pulmonary vascularity. Atherosclerotic calcification aorta. Emphysematous changes with minimal central peribronchial thickening consistent with COPD. Biapical scarring greater on RIGHT. No definite pulmonary infiltrate, pleural effusion or pneumothorax. Bones demineralized. IMPRESSION: COPD changes without acute abnormalities. Aortic Atherosclerosis (ICD10-I70.0). Electronically Signed   By: Ulyses SouthwardMark  Boles M.D.   On: 05/10/2019 17:51    Assessment and Plan:   1.  Elevated troponin, last value 2369 -No history of CAD, but no recent stress test or other ischemic eval -Most likely demand ischemia -Follow-up on echocardiogram results -If her EF is newly decreased or she has wall motion abnormalities, she will need an ischemic eval -However, she is acutely ill and not a candidate for invasive evaluation at this time  2.  Sick sinus syndrome, Medtronic pacemaker in place -Pacemaker is functioning normally  3.  Chronic diastolic CHF -Her weight is down, but she had interstitial pulmonary edema with trace effusions on CT. -However, she does seem to be intravascularly dry -Follow-up on echo -Once she is otherwise  stabilized, decide on diuresis  4.  AKI on CKD -Baseline creatinines approximately 1.2. -She was 1.8 on admission and up to 2.03 today. -Agree with possible ATN secondary to antibiotics and suspect p.o. intake was decreased as well -Urine sodium was 84 and urine potassium 27 -She may also have decreased renal perfusion secondary to hypotension and a low MAP, this may have been going on for some time -Discuss pressors with MD  5.  Severe sepsis: -Blood pressure still low, but no source of sepsis has been found. -UA is okay -No pneumonia on chest x-ray or CT -Blood cultures negative so far  Otherwise, per IM Principal Problem:   Severe sepsis (HCC) Active Problems:   Chronic diastolic heart failure (HCC)   Elevated troponin   Acute kidney injury superimposed on chronic kidney disease (HCC)   For questions or updates, please contact [facility] Please consult [email or web address] for contact info under Cardiology/STEMI.   [place] QuitterSigned, Rhonda Barrett, PA-C  05/11/2019 9:57 AM  Patient seen and examined with Theodore Demarkhonda Barrett, PA-C.  Agree as above, with the following exceptions and changes as noted below.  Feels unwell and presented with presyncope and hypotension, now with an elevated troponin of approximately 2600.  No sensation of chest pain.  She has  been lightheaded and dizzy for approximately 2 weeks.  Concern for poor p.o. intake from primary service.  Gen: NAD, CV: RRR, 3/6 systolic murmur, slightly obscured by respiratory sounds, Lungs: rales bilaterally in bases, Abd: soft, Extrem: Warm, well perfused, no edema, Neuro/Psych: alert and oriented x 3, normal mood and affect. All available labs, radiology testing, previous records reviewed.   She is hypotensive and appears dry on exam.  Her troponin is significantly elevated, and my suspicion is for a stress cardiomyopathy with apical hypokinesis and a hyperdynamic base creating an LVOT obstruction.  Other etiology could be ischemia, ECG  has some mild T wave inversions laterally.  We will obtain an echocardiogram to evaluate further.  Pressors have been discussed, however if the patient has functional LVOT obstruction from stress cardiomyopathy, fluids or selective vasopressors may be a better option than inotropes which may worsen this.  I discussed this with the critical care team.  ADDENDUM: Preliminary review of echocardiogram images at the bedside reveals apical hypokinesis with hyperdynamic base and LVOT obstruction with systolic anterior motion of the mitral valve and posteriorly directed mitral valve regurgitation.  Will await formal interpretation however volume resuscitation is appropriate to trial at this time.   Parke Poisson 05/11/19 1:03 PM

## 2019-05-11 NOTE — Progress Notes (Signed)
[facility] Progress Note Patient Name: [name] DOB: 08-19-1927 MRN: [id number]   Date of Service  05/11/2019  HPI/Events of Note  Needs home Melatonin  resumed  eICU Interventions  Melatonin order entered        Frederik Pear 05/11/2019, 9:06 PM

## 2019-05-11 NOTE — Progress Notes (Signed)
eLink Physician-Brief Progress Note Patient Name: [name] DOB: [birth year] MRN: [id number]   Date of Service  05/11/2019  HPI/Events of Note  85 year old woman with recent UTI, dizziness and now positive troponins. Admitted to [facility] with stable vitals on heparin infusion  eICU Interventions  ICU team aware and admission being done Please call E link if needed     Intervention Category Major Interventions: Other: Evaluation Type: New Patient Evaluation  [name] 05/11/2019, 3:23 AM

## 2019-05-11 NOTE — Progress Notes (Signed)
CRITICAL VALUE ALERT  Critical Value:  Troponin 2,369  Date & Time Notied:  05/11/19 0730  Provider Notified: Noe Gens NP

## 2019-05-12 DIAGNOSIS — F05 Delirium due to known physiological condition: Secondary | ICD-10-CM

## 2019-05-12 DIAGNOSIS — G9340 Encephalopathy, unspecified: Secondary | ICD-10-CM

## 2019-05-12 DIAGNOSIS — N10 Acute tubulo-interstitial nephritis: Secondary | ICD-10-CM

## 2019-05-12 DIAGNOSIS — Q248 Other specified congenital malformations of heart: Secondary | ICD-10-CM

## 2019-05-12 DIAGNOSIS — N179 Acute kidney failure, unspecified: Secondary | ICD-10-CM

## 2019-05-12 DIAGNOSIS — R570 Cardiogenic shock: Secondary | ICD-10-CM

## 2019-05-12 DIAGNOSIS — N189 Chronic kidney disease, unspecified: Secondary | ICD-10-CM

## 2019-05-12 DIAGNOSIS — E43 Unspecified severe protein-calorie malnutrition: Secondary | ICD-10-CM

## 2019-05-12 DIAGNOSIS — R778 Other specified abnormalities of plasma proteins: Secondary | ICD-10-CM

## 2019-05-12 DIAGNOSIS — J9601 Acute respiratory failure with hypoxia: Secondary | ICD-10-CM

## 2019-05-12 LAB — BASIC METABOLIC PANEL
Anion gap: 11 (ref 5–15)
BUN: 49 mg/dL — ABNORMAL HIGH (ref 8–23)
CO2: 21 mmol/L — ABNORMAL LOW (ref 22–32)
Calcium: 9.1 mg/dL (ref 8.9–10.3)
Chloride: 106 mmol/L (ref 98–111)
Creatinine, Ser: 2.25 mg/dL — ABNORMAL HIGH (ref 0.44–1.00)
GFR calc Af Amer: 22 mL/min — ABNORMAL LOW (ref >60)
GFR calc non Af Amer: 19 mL/min — ABNORMAL LOW (ref >60)
Glucose, Bld: 122 mg/dL — ABNORMAL HIGH (ref 70–99)
Potassium: 4.1 mmol/L (ref 3.5–5.1)
Sodium: 138 mmol/L (ref 135–145)

## 2019-05-12 LAB — CBC
HCT: 32 % — ABNORMAL LOW (ref 36.0–46.0)
Hemoglobin: 10.3 g/dL — ABNORMAL LOW (ref 12.0–15.0)
MCH: 32.2 pg (ref 26.0–34.0)
MCHC: 32.2 g/dL (ref 30.0–36.0)
MCV: 100 fL (ref 80.0–100.0)
Platelets: 136 10*3/uL — ABNORMAL LOW (ref 150–400)
RBC: 3.2 MIL/uL — ABNORMAL LOW (ref 3.87–5.11)
RDW: 14.4 % (ref 11.5–15.5)
WBC: 15.2 10*3/uL — ABNORMAL HIGH (ref 4.0–10.5)
nRBC: 0 % (ref 0.0–0.2)

## 2019-05-12 LAB — HEPARIN LEVEL (UNFRACTIONATED)
Heparin Unfractionated: 0.24 IU/mL — ABNORMAL LOW (ref 0.30–0.70)
Heparin Unfractionated: 0.34 IU/mL (ref 0.30–0.70)

## 2019-05-12 MED ORDER — MELATONIN 3 MG PO TABS
10.0000 mg | ORAL_TABLET | Freq: Every evening | ORAL | Status: DC | PRN
  Administered 2019-05-12: 10.5 mg via ORAL
  Filled 2019-05-12: qty 4

## 2019-05-12 MED ORDER — SODIUM CHLORIDE 0.9 % IV BOLUS
250.0000 mL | Freq: Once | INTRAVENOUS | Status: AC
  Administered 2019-05-12: 250 mL via INTRAVENOUS

## 2019-05-12 MED ORDER — IPRATROPIUM-ALBUTEROL 0.5-2.5 (3) MG/3ML IN SOLN
3.0000 mL | Freq: Four times a day (QID) | RESPIRATORY_TRACT | Status: DC
  Administered 2019-05-12: 08:00:00 3 mL via RESPIRATORY_TRACT
  Filled 2019-05-12: qty 3

## 2019-05-12 MED ORDER — ALBUTEROL SULFATE (2.5 MG/3ML) 0.083% IN NEBU: 2.5000 mg | INHALATION_SOLUTION | RESPIRATORY_TRACT | Status: DC | PRN

## 2019-05-12 MED ORDER — ALBUMIN HUMAN 5 % IV SOLN
12.5000 g | Freq: Once | INTRAVENOUS | Status: AC
  Administered 2019-05-12: 12.5 g via INTRAVENOUS
  Filled 2019-05-12: qty 250

## 2019-05-12 NOTE — Progress Notes (Signed)
Pt OOB to chair x 30 min.  C/O of SOB, and dizziness  resp rate 30,  Paced rhythm.  Sats good 94%.  Placed back to bed, resting now.

## 2019-05-12 NOTE — Progress Notes (Signed)
ANTICOAGULATION CONSULT NOTE - Follow Up Consult  Pharmacy Consult for Heparin Indication: pulmonary embolus  Allergies  Allergen Reactions  . Ciprofloxacin Hives and Other (See Comments)    Got hives after 10 days of both cipro and flagyl, not sure which caused the hives  . Flagyl [Metronidazole] Hives and Other (See Comments)    Got hives after 10 days of both cipro and flagyl, not sure which caused the hives  . Morphine And Related Nausea And Vomiting    Patient Measurements: Height: 5\' 4"  (162.6 cm) Weight: 130 lb 11.7 oz (59.3 kg) IBW/kg (Calculated) : 54.7 Heparin Dosing Weight:   Vital Signs: Temp: 97.6 F (36.4 C) (12/29 0400) Temp Source: Oral (12/29 0400) BP: 85/44 (12/29 0400) Pulse Rate: 72 (12/29 0400)  Labs: Recent Labs    05/10/19 1707 05/10/19 1707 05/10/19 1901 05/11/19 0009 05/11/19 0453 05/11/19 1423 05/12/19 0226  HGB 12.5  --   --   --  11.8*  --  10.3*  HCT 39.8  --   --   --  38.0  --  32.0*  PLT 201  --   --   --  197  --  136*  APTT 23*  --   --   --   --   --   --   LABPROT 13.7  --   --   --   --   --   --   INR 1.1  --   --   --   --   --   --   HEPARINUNFRC  --    < >  --   --  1.00* 1.08* 0.24*  CREATININE 1.85*  --   --   --  2.03*  --   --   TROPONINIHS 1,644*  --  1,827* 1,630* 2,369*  --   --    < > = values in this interval not displayed.    Estimated Creatinine Clearance: 16.2 mL/min (A) (by C-G formula based on SCr of 2.03 mg/dL (H)).   Medications:  Infusions:  . ceFEPime (MAXIPIME) IV Stopped (05/11/19 1642)  . heparin 600 Units/hr (05/12/19 0400)  . lactated ringers 50 mL/hr at 05/12/19 0400    Assessment: Patient with low heparin level.  No heparin issues noted.  Goal of Therapy:  Heparin level 0.3-0.7 units/ml Monitor platelets by anticoagulation protocol: Yes   Plan:  Increase hepairn to 600 units/hr Recheck heparin level at [place], Shea Stakes Crowford 05/12/2019,5:16 AM

## 2019-05-12 NOTE — Progress Notes (Signed)
[place] for IV heparin Indication: NSTEMI  Allergies  Allergen Reactions  . Ciprofloxacin Hives and Other (See Comments)    Got hives after 10 days of both cipro and flagyl, not sure which caused the hives  . Flagyl [Metronidazole] Hives and Other (See Comments)    Got hives after 10 days of both cipro and flagyl, not sure which caused the hives  . Morphine And Related Nausea And Vomiting    Patient Measurements: Height: 5\' 4"  (162.6 cm) Weight: 130 lb 11.7 oz (59.3 kg) IBW/kg (Calculated) : 54.7 Heparin Dosing Weight: n/a. TBW = 59 kg  Vital Signs: Temp: 97.8 F (36.6 C) (12/29 1200) Temp Source: Oral (12/29 1200) BP: 110/53 (12/29 1200) Pulse Rate: 70 (12/29 1200)  Labs: Recent Labs    05/10/19 1707 05/10/19 1707 05/10/19 1901 05/11/19 0009 05/11/19 0453 05/11/19 1423 05/12/19 0226 05/12/19 1202  HGB 12.5  --   --   --  11.8*  --  10.3*  --   HCT 39.8  --   --   --  38.0  --  32.0*  --   PLT 201  --   --   --  197  --  136*  --   APTT 23*  --   --   --   --   --   --   --   LABPROT 13.7  --   --   --   --   --   --   --   INR 1.1  --   --   --   --   --   --   --   HEPARINUNFRC  --    < >  --   --  1.00* 1.08* 0.24* 0.34  CREATININE 1.85*  --   --   --  2.03*  --   --  2.25*  TROPONINIHS 1,644*  --  1,827* 1,630* 2,369*  --   --   --    < > = values in this interval not displayed.    Estimated Creatinine Clearance: 14.6 mL/min (A) (by C-G formula based on SCr of 2.25 mg/dL (H)).   Medical History: Past Medical History:  Diagnosis Date  . Acute kidney injury superimposed on chronic kidney disease ([place]) 05/11/2019  . Chest wall pain 2003   atypical, now resolved  . CKD (chronic kidney disease), stage III    Cr 1.27, GFR 40 (07/2011)  . Diverticulitis    with DT flare in 6/09  . Gout    R hand  . Hypertension   . Hypertriglyceridemia   . Macular degeneration    Dr. Peter Garter  . Pacemaker 1994   MDT dual lead PPM,  sees Dr. Recardo Evangelist annually. Last gen change 10/2017  . Sick sinus syndrome ([facility])    with bradycardia, has [employer] pacemaker  . Tobacco use disorder    quit 01/2013--restarted in 2017  . Urge incontinence     Medications:  Not on anticoagulants PTA  Assessment: 7 yoF with NSTEMI and possible Takotsubo cardiomyopathy  Pharmacy is consulted to assist with management of IV heparin dosing.  Baseline INR, aPTT, CBC: WNL  Prior anticoagulation: none, ASA only PTA  SCr elevated on admission  Significant events: -12/28: ECHO EF 40-45%, possible Takotsubo CM  Today, 05/12/19  Heparin level 0.34 (therapeutic) on 600 units/hr  No bleeding noted per RN report  Hgb and Pltc down slightly from yesterday  Goal of Therapy: Heparin level  0.3-0.7 units/ml Monitor platelets by anticoagulation protocol: Yes  Plan:  Continue heparin at present rate (600 units/hr)  Recheck heparin level in AM  Daily heparin level and CBC while on heparin  Monitor for any reports of bleeding  Elie Goody, PharmD, BCPS 05/12/2019  1:18 PM

## 2019-05-12 NOTE — Progress Notes (Signed)
[facility] Progress Note Patient Name: [name] DOB: [birth year] MRN: [id number]   Date of Service  05/12/2019  HPI/Events of Note  PT with hypotension  eICU Interventions  Albumin 5 % 12.5 gm iv x 1 to follow 250 ml  Of crystalloid iv fluids given earlier.        Kerry Kass Jacqualyn Sedgwick 05/12/2019, 12:22 AM

## 2019-05-12 NOTE — Progress Notes (Addendum)
Progress Note  Patient Name: [name] Date of Encounter: 05/12/2019  Primary Cardiologist:  Thurmon Fair, MD  Subjective   Has to sit up and wear O2 to breathe, but otherwise feels a little better today.   Inpatient Medications    Scheduled Meds: . aspirin EC  81 mg Oral Daily  . Chlorhexidine Gluconate Cloth  6 each Topical Daily  . ipratropium-albuterol  3 mL Nebulization Q6H WA  . mouth rinse  15 mL Mouth Rinse BID   Continuous Infusions: . ceFEPime (MAXIPIME) IV Stopped (05/11/19 1642)  . heparin 600 Units/hr (05/12/19 0400)  . lactated ringers 50 mL/hr at 05/12/19 0400   PRN Meds: acetaminophen, albuterol, Melatonin, ondansetron (ZOFRAN) IV   Vital Signs    Vitals:   05/12/19 0600 05/12/19 0800 05/12/19 0820 05/12/19 0857  BP: (!) 110/50     Pulse: 79 70 70 71  Resp: (!) 28 (!) 28 18 (!) 23  Temp:  97.8 F (36.6 C)    TempSrc:  Oral    SpO2: 97% 96% 97% 95%  Weight:      Height:        Intake/Output Summary (Last 24 hours) at 05/12/2019 9604 Last data filed at 05/12/2019 0800 Gross per 24 hour  Intake 2106.91 ml  Output 10 ml  Net 2096.91 ml   Filed Weights   05/10/19 2035 05/11/19 0314 05/12/19 0354  Weight: 53.5 kg 59.3 kg 59.3 kg   Last Weight  Most recent update: 05/12/2019  3:54 AM   Weight  59.3 kg (130 lb 11.7 oz)           Weight change: 5.775 kg   Telemetry    AV paced - Personally Reviewed  ECG    12/27 ECG is Atrial paced rhythm, heart rate 72, no acute ischemic changes - Personally Reviewed  Physical Exam   General: Well developed, frail elderly, female appearing in no acute distress. Head: Normocephalic, atraumatic.  Neck: Supple without bruits, JVD 9 cm. Lungs:  Resp regular and unlabored, rales bases. Heart: RRR, S1, S2, no S3, S4, 2/6 murmur; no rub. Abdomen: Soft, non-tender, non-distended with normoactive bowel sounds. No hepatomegaly. No rebound/guarding. No obvious abdominal masses. Extremities: No  clubbing, cyanosis, no edema. Distal pedal pulses are 2+ bilaterally. Neuro: Alert and oriented X 3. Moves all extremities spontaneously. Psych: Normal affect, anxious about her condition  Labs    Hematology Recent Labs  Lab 05/10/19 1707 05/11/19 0453 05/12/19 0226  WBC 9.4 12.8* 15.2*  RBC 3.98 3.74* 3.20*  HGB 12.5 11.8* 10.3*  HCT 39.8 38.0 32.0*  MCV 100.0 101.6* 100.0  MCH 31.4 31.6 32.2  MCHC 31.4 31.1 32.2  RDW 14.0 14.4 14.4  PLT 201 197 136*    Chemistry Recent Labs  Lab 05/10/19 1707 05/11/19 0453  NA 140 140  K 4.3 4.8  CL 105 106  CO2 26 25  GLUCOSE 105* 134*  BUN 25* 28*  CREATININE 1.85* 2.03*  CALCIUM 9.2 9.1  PROT 6.2*  --   ALBUMIN 3.6  --   AST 28  --   ALT 9  --   ALKPHOS 42  --   BILITOT 0.8  --   GFRNONAA 24* 21*  GFRAA 27* 25*  ANIONGAP 9 9     High Sensitivity Troponin:   Recent Labs  Lab 05/10/19 1707 05/10/19 1901 05/11/19 0009 05/11/19 0453  TROPONINIHS 1,644* 1,827* 1,630* 2,369*      BNP Recent Labs  Lab 05/10/19  2033  BNP 367.4*     DDimer  Recent Labs  Lab 05/10/19 1707  DDIMER 1.49*     Radiology    CT ABDOMEN PELVIS WO CONTRAST  Result Date: 05/10/2019 CLINICAL DATA:  Shortness of breath and nonlocalized abdominal pain EXAM: CT CHEST, ABDOMEN AND PELVIS WITHOUT CONTRAST TECHNIQUE: Multidetector CT imaging of the chest, abdomen and pelvis was performed following the standard protocol without IV contrast. COMPARISON:  Chest radiograph 05/10/2019, CT abdomen pelvis 12/14/2014 FINDINGS: CT CHEST FINDINGS Cardiovascular: Right chest wall pacer pack with leads directed towards the right atrium and ventricle. Normal cardiac size. Extensive coronary artery calcium. Trace pericardial fluid, likely at the upper limits of normal. Extensive atherosclerotic calcification of the thoracic aorta. Aorta is normal caliber. Normal 3 vessel branching of the aortic arch. Extensive calcification of the proximal great vessels.  Central pulmonary arteries are the upper limits of normal for size. Luminal evaluation precluded in the absence of contrast. Mediastinum/Nodes: Few scattered low-attenuation subcentimeter mediastinal nodes, nonspecific and potentially reactive or edematous. No pathologically enlarged mediastinal or axillary adenopathy. Hilar lymph nodes difficult to assess in absence of contrast media. Thyroid gland and thoracic inlet is unremarkable. The no acute abnormality of trachea or esophagus. Posterior bowing of the trachea likely related to exhalation during imaging. Lungs/Pleura: There is diffuse interlobular septal thickening as well as peribronchovascular thickening and trace bilateral effusions with adjacent passive and dependent atelectasis. Suspect some underlying emphysematous change though difficult to fully assess underlying parenchyma given extensive respiratory motion. No consolidative opacity. No pneumothorax. Biapical pleuroparenchymal scarring is present. Musculoskeletal: Multilevel degenerative changes are present in the imaged portions of the spine. No acute osseous abnormality or suspicious osseous lesion. Assessment for rib fractures is limited given motion artifact with step artifact on multiplanar reconstruction. No gross rib deformities. CT ABDOMEN PELVIS FINDINGS Hepatobiliary: No focal liver abnormality is seen. Patient is post cholecystectomy. Slight prominence of the biliary tree likely related to reservoir effect. No calcified intraductal gallstones. Pancreas: Extensive fatty replacement of the pancreas. No peripancreatic inflammation or ductal dilatation. Spleen: Normal in size without focal abnormality. Adrenals/Urinary Tract: Normal adrenal glands. Atrophic kidneys. No visible concerning renal lesions. No urolithiasis or hydronephrosis. Urinary bladder is largely decompressed at the time of exam and therefore poorly evaluated by CT imaging. Stomach/Bowel: Distal esophagus, stomach and duodenal  sweep are unremarkable. No small bowel wall thickening or dilatation. No evidence of obstruction. The appendix is surgically absent. No colonic dilatation or wall thickening. Scattered colonic diverticula without focal pericolonic inflammation to suggest diverticulitis. Vascular/Lymphatic: Atherosclerotic plaque within the normal caliber aorta. No suspicious or enlarged lymph nodes in the included lymphatic chains. Reproductive: Small amount of air within the vaginal canal. Uterus is surgically absent. No concerning adnexal lesions. Other: Circumferential body wall edema as well as mid mesenteric increased attenuation. No free fluid. No free air. No bowel containing hernias. Musculoskeletal: Diffuse muscular atrophy, likely senescent or related to deconditioning. Split deformity of the L3 vertebral body 60% central height loss, new since comparison August 2016. Age indeterminate on this exam though favor remote. IMPRESSION: 1. Interstitial pulmonary edema with trace bilateral effusions. 2. Split deformity of the L3 vertebral body, AOSpine A2 ,60% central height loss, new since comparison August 2016. Age indeterminate though favor remote. Recommend correlation with point tenderness. 3. Features of mild anasarca with circumferential body wall edema as well as likely mild been mesenteric edema 4. Fatty replacement of the pancreas. 5. Renal atrophy. 6. Prior hysterectomy, appendectomy, cholecystectomy. 7.  Aortic Atherosclerosis (  ICD10-I70.0). 8.  Emphysema (ICD10-J43.9). 9. Extensive coronary atherosclerosis. Electronically Signed   By: Kreg Shropshire M.D.   On: 05/10/2019 19:14   CT CHEST WO CONTRAST  Result Date: 05/10/2019 CLINICAL DATA:  Shortness of breath and nonlocalized abdominal pain EXAM: CT CHEST, ABDOMEN AND PELVIS WITHOUT CONTRAST TECHNIQUE: Multidetector CT imaging of the chest, abdomen and pelvis was performed following the standard protocol without IV contrast. COMPARISON:  Chest radiograph  05/10/2019, CT abdomen pelvis 12/14/2014 FINDINGS: CT CHEST FINDINGS Cardiovascular: Right chest wall pacer pack with leads directed towards the right atrium and ventricle. Normal cardiac size. Extensive coronary artery calcium. Trace pericardial fluid, likely at the upper limits of normal. Extensive atherosclerotic calcification of the thoracic aorta. Aorta is normal caliber. Normal 3 vessel branching of the aortic arch. Extensive calcification of the proximal great vessels. Central pulmonary arteries are the upper limits of normal for size. Luminal evaluation precluded in the absence of contrast. Mediastinum/Nodes: Few scattered low-attenuation subcentimeter mediastinal nodes, nonspecific and potentially reactive or edematous. No pathologically enlarged mediastinal or axillary adenopathy. Hilar lymph nodes difficult to assess in absence of contrast media. Thyroid gland and thoracic inlet is unremarkable. The no acute abnormality of trachea or esophagus. Posterior bowing of the trachea likely related to exhalation during imaging. Lungs/Pleura: There is diffuse interlobular septal thickening as well as peribronchovascular thickening and trace bilateral effusions with adjacent passive and dependent atelectasis. Suspect some underlying emphysematous change though difficult to fully assess underlying parenchyma given extensive respiratory motion. No consolidative opacity. No pneumothorax. Biapical pleuroparenchymal scarring is present. Musculoskeletal: Multilevel degenerative changes are present in the imaged portions of the spine. No acute osseous abnormality or suspicious osseous lesion. Assessment for rib fractures is limited given motion artifact with step artifact on multiplanar reconstruction. No gross rib deformities. CT ABDOMEN PELVIS FINDINGS Hepatobiliary: No focal liver abnormality is seen. Patient is post cholecystectomy. Slight prominence of the biliary tree likely related to reservoir effect. No calcified  intraductal gallstones. Pancreas: Extensive fatty replacement of the pancreas. No peripancreatic inflammation or ductal dilatation. Spleen: Normal in size without focal abnormality. Adrenals/Urinary Tract: Normal adrenal glands. Atrophic kidneys. No visible concerning renal lesions. No urolithiasis or hydronephrosis. Urinary bladder is largely decompressed at the time of exam and therefore poorly evaluated by CT imaging. Stomach/Bowel: Distal esophagus, stomach and duodenal sweep are unremarkable. No small bowel wall thickening or dilatation. No evidence of obstruction. The appendix is surgically absent. No colonic dilatation or wall thickening. Scattered colonic diverticula without focal pericolonic inflammation to suggest diverticulitis. Vascular/Lymphatic: Atherosclerotic plaque within the normal caliber aorta. No suspicious or enlarged lymph nodes in the included lymphatic chains. Reproductive: Small amount of air within the vaginal canal. Uterus is surgically absent. No concerning adnexal lesions. Other: Circumferential body wall edema as well as mid mesenteric increased attenuation. No free fluid. No free air. No bowel containing hernias. Musculoskeletal: Diffuse muscular atrophy, likely senescent or related to deconditioning. Split deformity of the L3 vertebral body 60% central height loss, new since comparison August 2016. Age indeterminate on this exam though favor remote. IMPRESSION: 1. Interstitial pulmonary edema with trace bilateral effusions. 2. Split deformity of the L3 vertebral body, AOSpine A2 ,60% central height loss, new since comparison August 2016. Age indeterminate though favor remote. Recommend correlation with point tenderness. 3. Features of mild anasarca with circumferential body wall edema as well as likely mild been mesenteric edema 4. Fatty replacement of the pancreas. 5. Renal atrophy. 6. Prior hysterectomy, appendectomy, cholecystectomy. 7.  Aortic Atherosclerosis (ICD10-I70.0). 8.  Emphysema (ICD10-J43.9). 9. Extensive coronary atherosclerosis. Electronically Signed   By: Lovena Le M.D.   On: 05/10/2019 19:14   DG Chest Port 1 View  Result Date: 05/10/2019 CLINICAL DATA:  Shortness of breath, generalized weakness, just finished antibiotics for UTI yesterday, hypertension, smoker EXAM: PORTABLE CHEST 1 VIEW COMPARISON:  Portable exam 1717 hours compared to 06/29/2015 FINDINGS: RIGHT subclavian transvenous pacemaker with leads projecting over RIGHT atrium and RIGHT ventricle. Normal heart size, mediastinal contours, and pulmonary vascularity. Atherosclerotic calcification aorta. Emphysematous changes with minimal central peribronchial thickening consistent with COPD. Biapical scarring greater on RIGHT. No definite pulmonary infiltrate, pleural effusion or pneumothorax. Bones demineralized. IMPRESSION: COPD changes without acute abnormalities. Aortic Atherosclerosis (ICD10-I70.0). Electronically Signed   By: Lavonia Dana M.D.   On: 05/10/2019 17:51   ECHOCARDIOGRAM COMPLETE  Result Date: 05/11/2019   ECHOCARDIOGRAM REPORT   Patient Name:   [name] Date of Exam: 05/11/2019 Medical Rec #:  [id number]       Height:       64.0 in Accession #:    [id number]      Weight:       130.7 lb Date of Birth:  Mar 12, 1928        BSA:          1.63 m Patient Age:    68 years        BP:           87/50 mmHg Patient Gender: F               HR:           70 bpm. Exam Location:  Inpatient Procedure: 2D Echo, Cardiac Doppler and Color Doppler Indications:    Acute MI  History:        Patient has prior history of Echocardiogram examinations, most                 recent 06/25/2018. CHF, Pacemaker, Arrythmias:SSS and Atrial                 Fibrillation; Signs/Symptoms:Chest Pain, Shortness of Breath and                 Hypotension. CKD, elevated Troponin.  Sonographer:    Dustin Flock Referring Phys: [id number] [facility] ALI IMPRESSIONS  1. Left ventricular ejection fraction, by visual estimation, is 40  to 45%. The left ventricle has moderately decreased function. There is mildly increased left ventricular hypertrophy.  2. The left ventricle demonstrates regional wall motion abnormalities.  3. Severe hypokinesis of the left ventricular, mid-apical anteroseptal wall, anterior wall, apical segment and inferoapical segment. Findings could be consistent with LAD territory MI vs Takatsubo cardiomyopathy.  4. Left ventricular diastolic parameters are consistent with Grade II diastolic dysfunction (pseudonormalization).  5. Mildly elevated pulmonary artery systolic pressure.  6. Global right ventricle has mildly reduced systolic function.The right ventricular size is normal. No increase in right ventricular wall thickness.  7. The aortic valve is tricuspid. Aortic valve regurgitation is trivial. Mild aortic valve stenosis. There is a dynamic LVOT gradient that is mostly responsivly for the increased transvalvular aortic gradient.  8. Aortic valve peak gradient measures 68.6 mmHg.  9. Aortic valve mean gradient measures 39.0 mmHg. 10. Right atrial size was mildly dilated. 11. Elevated left atrial and left ventricular end-diastolic pressures. 12. Left atrial size was mildly dilated. 13. Mild to moderate mitral annular calcification. 14. The mitral valve is abnormal. Mild mitral valve regurgitation. 15. The tricuspid valve is  grossly normal. 16. The pulmonic valve was grossly normal. Pulmonic valve regurgitation is not visualized. 17. The inferior vena cava is dilated in size with <50% respiratory variability, suggesting right atrial pressure of 15 mmHg. 18. The tricuspid regurgitant velocity is 2.43 m/s, and with an assumed right atrial pressure of 15 mmHg, the estimated right ventricular systolic pressure is mildly elevated at 38.6 mmHg. FINDINGS  Left Ventricle: Left ventricular ejection fraction, by visual estimation, is 40 to 45%. The left ventricle has moderately decreased function. Severe hypokinesis of the left  ventricular, mid-apical anteroseptal wall, anterior wall, apical segment and inferoapical segment. The left ventricle demonstrates regional wall motion abnormalities. There is mildly increased left ventricular hypertrophy. Left ventricular diastolic parameters are consistent with Grade II diastolic dysfunction (pseudonormalization). Elevated left atrial and left ventricular end-diastolic pressures. Right Ventricle: The right ventricular size is normal. No increase in right ventricular wall thickness. Global RV systolic function is has mildly reduced systolic function. The tricuspid regurgitant velocity is 2.43 m/s, and with an assumed right atrial pressure of 15 mmHg, the estimated right ventricular systolic pressure is mildly elevated at 38.6 mmHg. Left Atrium: Left atrial size was mildly dilated. Right Atrium: Right atrial size was mildly dilated Pericardium: There is no evidence of pericardial effusion. Mitral Valve: The mitral valve is abnormal. Mild to moderate mitral annular calcification. Mild mitral valve regurgitation, with posteriorly-directed jet. Tricuspid Valve: The tricuspid valve is grossly normal. Tricuspid valve regurgitation is trivial. Aortic Valve: The aortic valve is tricuspid. Aortic valve regurgitation is trivial. Mild aortic stenosis is present. Aortic valve mean gradient measures 39.0 mmHg. Aortic valve peak gradient measures 68.6 mmHg. Aortic valve area, by VTI measures 1.02 cm. Pulmonic Valve: The pulmonic valve was grossly normal. Pulmonic valve regurgitation is not visualized. Pulmonic regurgitation is not visualized. Aorta: The aortic root and ascending aorta are structurally normal, with no evidence of dilitation. Venous: The inferior vena cava is dilated in size with less than 50% respiratory variability, suggesting right atrial pressure of 15 mmHg. IAS/Shunts: No atrial level shunt detected by color flow Doppler.  LEFT VENTRICLE PLAX 2D LVIDd:         4.30 cm  Diastology LVIDs:          3.10 cm  LV e' lateral:   5.78 cm/s LV PW:         1.10 cm  LV E/e' lateral: 24.9 LV IVS:        0.90 cm  LV e' medial:    4.73 cm/s LVOT diam:     1.80 cm  LV E/e' medial:  30.4 LV SV:         45 ml LV SV Index:   27.52 LVOT Area:     2.54 cm  RIGHT VENTRICLE RV Basal diam:  2.20 cm RV S prime:     6.83 cm/s TAPSE (M-mode): 2.2 cm LEFT ATRIUM           Index LA diam:      4.50 cm 2.76 cm/m LA Vol (A4C): 56.5 ml 34.60 ml/m  AORTIC VALVE AV Area (Vmax):    0.96 cm AV Area (Vmean):   0.90 cm AV Area (VTI):     1.02 cm AV Vmax:           414.00 cm/s AV Vmean:          288.000 cm/s AV VTI:            0.981 m AV Peak Grad:  68.6 mmHg AV Mean Grad:      39.0 mmHg LVOT Vmax:         155.50 cm/s LVOT Vmean:        102.050 cm/s LVOT VTI:          0.393 m LVOT/AV VTI ratio: 0.40  AORTA Ao Root diam: 3.00 cm MITRAL VALVE                         TRICUSPID VALVE MV Area (PHT): 2.91 cm              TR Peak grad:   23.6 mmHg MV PHT:        75.69 msec            TR Vmax:        244.00 cm/s MV Decel Time: 261 msec MV E velocity: 144.00 cm/s 103 cm/s  SHUNTS MV A velocity: 66.50 cm/s  70.3 cm/s Systemic VTI:  0.39 m MV E/A ratio:  2.17        1.5       Systemic Diam: 1.80 cm  Zoila Shutter MD Electronically signed by Zoila Shutter MD Signature Date/Time: 05/11/2019/2:49:42 PM    Final      Cardiac Studies   ECHO:  12/28  1. Left ventricular ejection fraction, by visual estimation, is 40 to 45%. The left ventricle has moderately decreased function. There is mildly increased left ventricular hypertrophy.  2. The left ventricle demonstrates regional wall motion abnormalities.  3. Severe hypokinesis of the left ventricular, mid-apical anteroseptal wall, anterior wall, apical segment and inferoapical segment. Findings could be consistent with LAD territory MI vs Takatsubo cardiomyopathy.  4. Left ventricular diastolic parameters are consistent with Grade II diastolic dysfunction (pseudonormalization).  5. Mildly  elevated pulmonary artery systolic pressure.  6. Global right ventricle has mildly reduced systolic function.The right ventricular size is normal. No increase in right ventricular wall thickness.  7. The aortic valve is tricuspid. Aortic valve regurgitation is trivial. Mild aortic valve stenosis. There is a dynamic LVOT gradient that is mostly responsivly for the increased transvalvular aortic gradient.  8. Aortic valve peak gradient measures 68.6 mmHg.  9. Aortic valve mean gradient measures 39.0 mmHg. 10. Right atrial size was mildly dilated. 11. Elevated left atrial and left ventricular end-diastolic pressures. 12. Left atrial size was mildly dilated. 13. Mild to moderate mitral annular calcification. 14. The mitral valve is abnormal. Mild mitral valve regurgitation. 15. The tricuspid valve is grossly normal. 16. The pulmonic valve was grossly normal. Pulmonic valve regurgitation is not visualized. 17. The inferior vena cava is dilated in size with <50% respiratory variability, suggesting right atrial pressure of 15 mmHg. 18. The tricuspid regurgitant velocity is 2.43 m/s, and with an assumed right atrial pressure of 15 mmHg, the estimated right ventricular systolic pressure is mildly elevated at 38.6 mmHg.  Patient Profile     85 y.o. female w/ hx  PAT, SSS s/p MDT PPM 1994 w/ last gen change 10/2017, D-CHF, CKD III, nl MV 2010, recent UTI rx w/ Bactrim, was admitted 12/27 with presyncope and hypotension, seen by cards for elevated troponin   Assessment & Plan    1. NSTEMI, ?Takotsubo CM - elevated troponin and decreased EF w/ WMA on echo - c/w Takotsubo CM, but needs ischemic eval - however, gen med condition and elevated Creatinine preclude this - continue to manage medically - she is on ASA - discuss adding high-dose statin w/  MD - BP too low for BB - unable to add ACE/ARB/Entresto due to decreased renal function - no ischemic sx - w/ decreased EF, monitor volume closely  2.  Severe sepsis, hypotension - pt got albumin overnight plus LR infusion to support BP - I/O net + 2.9 L so far, continue to follow - Mgt per CCM  Otherwise, per CCM/Dr Katrinka Blazing Principal Problem:   Severe sepsis [facility]) Active Problems:   Chronic diastolic heart failure (HCC)   Elevated troponin   Acute kidney injury superimposed on chronic kidney disease (HCC)    Signed, Theodore Demark , PA-C 9:27 AM 05/12/2019 Pager: [phone]  Patient seen and examined with Theodore Demark, PA-C.  Agree as above, with the following exceptions and changes as noted below.  Ms. Shuart looks better today but says she feels worse.  I observed her transferring from the bedside commode to the bedside chair, she felt short of breath and overall does not feel well. Gen: Fatigued and short of breath, CV: RRR, 1-2/6 systolic murmur, Lungs: clear, Abd: soft, Extrem: Warm, well perfused, trace pedal edema, Neuro/Psych: alert and oriented x 3, worried but appropriate. All available labs, radiology testing, previous records reviewed.   She demonstrates LVOT obstruction with systolic anterior motion of the mitral valve and mitral regurgitation.  Her gradient through the LVOT obstruction appears significant, with a velocity of 4.7 m/s and peak gradient of approximately 80-90 mmHg.  Given that the mild regurgitation is posteriorly directed, I do not believe this signal is contaminated with MR.  Challenging to demonstrate dynamic obstruction as echo was performed and patient was critically ill.  This finding of LVOT obstruction could be secondary to LAD ischemia with septal and apical severe hypokinesis with a hyperdynamic base or Takotsubo cardiomyopathy from overall illness.  Unfortunately we are unable to complete a evaluation for ischemia at this time due to renal function and overall illness.  An ischemia evaluation is warranted if that is within the patient's goals of care.  Her blood pressure is marginally better today,  however she is still symptomatic with shortness of breath.  I believe that her LV pressure is high and she has been adequately volume resuscitated without significant improvement in her symptoms or hemodynamics.  I have discussed my thoughts with Dr. Celine Mans in critical care.  I believe a trial of selective vasopressor is warranted to increase afterload and decrease the functional obstruction through the LVOT.  This may help her feel symptomatically better as well as relieve the obstruction and improve hemodynamics.  If her pressure improves on vasopressor, can consider addition of a very low-dose beta-blocker to continue to improve hemodynamics.  Would avoid inotropic agents including Levophed as this will likely worsen LVOT obstruction.  RECOMMENDATIONS: -Add phenylephrine today.  Discussed with the patient's nurse. Patient's MAP is 71 at the moment, titration goal may be challenging to establish.  Goal is to increase afterload to relieve LVOT obstruction, may be best to follow patient's symptoms Plan to repeat a limited echo if she has symptomatic improvement on phenylephrine, likely tomorrow.  If we are able to increase afterload and relieve the obstruction, we can discuss adding a low dose of metoprolol to continue to improve hemodynamics.    Parke Poisson 05/12/19 11:49 AM  CRITICAL CARE Performed by: Weston Brass, MD   Total critical care time: 30 minutes   Critical care time was exclusive of separately billable procedures and treating other patients.   Critical care was necessary to treat or  prevent imminent or life-threatening deterioration.   Critical care was time spent personally by me (independent of APPs or residents) on the following activities: development of treatment plan with patient and/or surrogate as well as nursing, discussions with consultants, evaluation of patient's response to treatment, examination of patient, obtaining history from patient or surrogate, ordering  and performing treatments and interventions, ordering and review of laboratory studies, ordering and review of radiographic studies, pulse oximetry and re-evaluation of patient's condition.

## 2019-05-12 NOTE — Progress Notes (Addendum)
NAME:  [name], MRN:  [id number], DOB:  Jun 07, 1927, LOS: 2 ADMISSION DATE:  05/10/2019, CONSULTATION DATE:  05/10/2019 REFERRING MD:  Tomi Bamberger, CHIEF COMPLAINT:  SOB  Brief History   85 y/o F who presented 12/27 with reports of pre-syncopal symptoms and hypotension.  Diagnosed with UTI two weeks prior to admit and was dizzy at that time. Treated with Macrobid.  Urine culture grew klebsiella which was resistant to Macrobid and she was changed to Bactrim DS on 12/22.  She took the last dose of Bactrim on 12/27 but felt dizzy and was afraid she would fall at home (lives alone).  She has been on lasix at home for LE swelling. Found to be hypotensive on admit, treated with 2L LR with improvement in BP.  ABG 7.26 / 50 / 65 / 22.  She was noted to have an AKI (baseline 1.23 > 1.85), elevated troponin (1644 > 1827) and elevated lactate concerning for sepsis.  CT chest, abd, pelvis completed which showed interstitial pulmonary edema, trace effusions, possible old T3 fracture but otherwise unremarkable.  UA negative.   Past Medical History  CKD Sick sinus syndrome status post pacemaker placement UTI - recently treated with bactrim as outpatient.   Significant Hospital Events   12/27 Admit   Consults:  Cardiology  Procedures:    Significant Diagnostic Tests:  CT Chest/ABD/Pelvis 12/27 >> interstitial pulmonary edema, trace bilateral effusions, mild anasarca / body wall edema, renal atrophy, emphysema, possible old T3 fracture  Micro Data:  COVID PCR 12/27 >> negative  Flu A/B 12/27 >> negative  BCx2 12/27 >>  UC 12/27 >>   Antimicrobials:  Vancomycin x1 12/27 Cefepime 12/27 >>  Interim history/subjective:  Afebrile overnight. Hemodynamically stable.  Having some confusion this morning and hallucinations at home as well. patient reported seeing people having sex in her room, thought the Ehrenfeld was a person.  Objective   Blood pressure (!) 110/50, pulse 71, temperature 97.8 F (36.6 C),  temperature source Oral, resp. rate (!) 23, height 5\' 4"  (1.626 m), weight 59.3 kg, SpO2 95 %.        Intake/Output Summary (Last 24 hours) at 05/12/2019 0940 Last data filed at 05/12/2019 0800 Gross per 24 hour  Intake 2106.91 ml  Output 10 ml  Net 2096.91 ml   Filed Weights   05/10/19 2035 05/11/19 0314 05/12/19 0354  Weight: 53.5 kg 59.3 kg 59.3 kg    Examination: General: pleasant thin elderly female lying in bed in NAD  HEENT: MM pink/dry, [place] O2, no jvd Neuro: AAOx4, speech clear, MAE CV: s1s2 RRR, 2-3/6 SEM noted PULM:  Tachypnea but non-labored, lungs bilaterally clear GI: soft, bsx4 active  Extremities: warm/dry, no edema  Skin: no rashes or lesions  Resolved Hospital Problem list   Hypotension  Assessment & Plan:   Severe Sepsis  Recent treatment for klebsiella UTI (resistant to macrobid, transitioned to bactrim & completed dosing).  On lasix at home with poor PO intake.  Lactic acid cleared with IVF's.  Patient appears dry on exam (? Poor intake with ongoing diuresis).  Received ~3.5L - No other clear source infection.  - on empiric cefepime, would complete total 5 day course for complicated UTI.   LVOT obstruction Discussed with cardiology Etiology of troponin elevation ischemic vs takotsubo's Given severity of LVOT and some ongoing hypotension which is not fluid responsive, trial of phenylephrine to see if the obstruction improves on repeat echo tomorrow.   Troponin Elevation  -appreciate Cardiology evaluation,  suspicion for takotsubo CM.   -continue heparin gtt, medical management with statin, ASA. Unable to tolerate BB and Ace-I due to hypotension and renal failure.  -tele monitoring   Acute Kidney Injury on CKD stage 3 Admission creatinine 1.85 with baseline 1.2-1.3.  Likely multifactorial in setting of UTI, abx use, poor PO intake.  -Trend BMP / urinary output -Replace electrolytes as indicated -Avoid nephrotoxic agents, ensure adequate renal  perfusion -I/O cath this am to assess for urine - Cr rising since admission, consider nephrology consultation, but this is likely pre-renalvs cardiorenal syndrome  Emphysema / COPD Without acute exacerbation -continue duoneb Q6 with PRN albuterol   Acute Hypoxemic Hypercarbic Respiratory Failure  -wean O2 for sats >90% - likely secondary to mild intersititial edema and trace bilateral pleural effusions -push pulmonary hygiene - IS, mobilize  - down to [facility]. Actually was weaned to room air but felt sob so was placed back on [facility]  Acute Encephalopathy ICU delirium - likely worsened by  [facility] Macular Degeneration -delirium precautions, will get OOB to chair today  The patient is critically ill with multiple organ systems failure and requires high complexity decision making for assessment and support, frequent evaluation and titration of therapies, application of advanced monitoring technologies and extensive interpretation of multiple databases.   Critical Care Time devoted to patient care services described in this note is 40 minutes. This time reflects time of care of this signee Charlott Holler . This critical care time does not reflect separately billable procedures or procedure time, teaching time or supervisory time of PA/NP/Med student/Med Resident etc but could involve care discussion time.  Mickel Baas Pulmonary [facility] 05/12/2019 11:47 AM  Pager: [phone] After hours pager: [phone]   Best practice:  Diet: heart healthy diet Pain/Anxiety/Delirium protocol (if indicated): N/A VAP protocol (if indicated): N/A DVT prophylaxis: Heparin IV GI prophylaxis: N/A Glucose control: N/A Mobility: As tolerated Code Status: DNR/DNI after discussion with patient Family Communication: Daughter - [name] [phone] Disposition: can downgrade to Step down today.   Labs/Imaging  CT Chest 12/27 personally reviewed - trace bilateral basilar  interstitial edema and trace bibasilar pleural effusions.   CBC: Recent Labs  Lab 05/10/19 1707 05/11/19 0453 05/12/19 0226  WBC 9.4 12.8* 15.2*  NEUTROABS 6.6  --   --   HGB 12.5 11.8* 10.3*  HCT 39.8 38.0 32.0*  MCV 100.0 101.6* 100.0  PLT 201 197 136*    Basic Metabolic Panel: Recent Labs  Lab 05/10/19 1707 05/11/19 0453  NA 140 140  K 4.3 4.8  CL 105 106  CO2 26 25  GLUCOSE 105* 134*  BUN 25* 28*  CREATININE 1.85* 2.03*  CALCIUM 9.2 9.1  MG  --  1.8  PHOS  --  3.5   GFR: Estimated Creatinine Clearance: 16.2 mL/min (A) (by C-G formula based on SCr of 2.03 mg/dL (H)). Recent Labs  Lab 05/10/19 1707 05/10/19 1708 05/10/19 1901 05/10/19 2252 05/11/19 0453 05/12/19 0226  WBC 9.4  --   --   --  12.8* 15.2*  LATICACIDVEN  --  1.8 3.1* 1.7  --   --     Liver Function Tests: Recent Labs  Lab 05/10/19 1707  AST 28  ALT 9  ALKPHOS 42  BILITOT 0.8  PROT 6.2*  ALBUMIN 3.6   No results for input(s): LIPASE, AMYLASE in the last 168 hours. No results for input(s): AMMONIA in the last 168 hours.  ABG    Component  Value Date/Time   PHART 7.267 (L) 05/10/2019 1940   PCO2ART 50.4 (H) 05/10/2019 1940   PO2ART 65.0 (L) 05/10/2019 1940   HCO3 22.3 05/10/2019 1940   ACIDBASEDEF 4.7 (H) 05/10/2019 1940   O2SAT 89.5 05/10/2019 1940     Coagulation Profile: Recent Labs  Lab 05/10/19 1707  INR 1.1    Cardiac Enzymes: No results for input(s): CKTOTAL, CKMB, CKMBINDEX, TROPONINI in the last 168 hours.  HbA1C: Hgb A1c MFr Bld  Date/Time Value Ref Range Status  05/11/2019 04:59 AM 5.3 4.8 - 5.6 % Final    Comment:    (NOTE) Pre diabetes:          5.7%-6.4% Diabetes:              >6.4% Glycemic control for   <7.0% adults with diabetes     CBG: Recent Labs  Lab 05/11/19 1339  GLUCAP 138*

## 2019-05-13 DIAGNOSIS — B9689 Other specified bacterial agents as the cause of diseases classified elsewhere: Secondary | ICD-10-CM

## 2019-05-13 DIAGNOSIS — R0600 Dyspnea, unspecified: Secondary | ICD-10-CM

## 2019-05-13 DIAGNOSIS — I214 Non-ST elevation (NSTEMI) myocardial infarction: Secondary | ICD-10-CM

## 2019-05-13 DIAGNOSIS — Z7189 Other specified counseling: Secondary | ICD-10-CM

## 2019-05-13 DIAGNOSIS — S2690XA Unspecified injury of heart, unspecified with or without hemopericardium, initial encounter: Secondary | ICD-10-CM

## 2019-05-13 DIAGNOSIS — Z515 Encounter for palliative care: Secondary | ICD-10-CM

## 2019-05-13 LAB — BASIC METABOLIC PANEL
Anion gap: 9 (ref 5–15)
BUN: 49 mg/dL — ABNORMAL HIGH (ref 8–23)
CO2: 22 mmol/L (ref 22–32)
Calcium: 9.1 mg/dL (ref 8.9–10.3)
Chloride: 108 mmol/L (ref 98–111)
Creatinine, Ser: 1.97 mg/dL — ABNORMAL HIGH (ref 0.44–1.00)
GFR calc Af Amer: 25 mL/min — ABNORMAL LOW (ref >60)
GFR calc non Af Amer: 22 mL/min — ABNORMAL LOW (ref >60)
Glucose, Bld: 100 mg/dL — ABNORMAL HIGH (ref 70–99)
Potassium: 4.2 mmol/L (ref 3.5–5.1)
Sodium: 139 mmol/L (ref 135–145)

## 2019-05-13 LAB — CBC
HCT: 30.7 % — ABNORMAL LOW (ref 36.0–46.0)
Hemoglobin: 9.5 g/dL — ABNORMAL LOW (ref 12.0–15.0)
MCH: 30.9 pg (ref 26.0–34.0)
MCHC: 30.9 g/dL (ref 30.0–36.0)
MCV: 100 fL (ref 80.0–100.0)
Platelets: 134 10*3/uL — ABNORMAL LOW (ref 150–400)
RBC: 3.07 MIL/uL — ABNORMAL LOW (ref 3.87–5.11)
RDW: 15 % (ref 11.5–15.5)
WBC: 15.4 10*3/uL — ABNORMAL HIGH (ref 4.0–10.5)
nRBC: 0 % (ref 0.0–0.2)

## 2019-05-13 LAB — HEPARIN LEVEL (UNFRACTIONATED): Heparin Unfractionated: 0.13 IU/mL — ABNORMAL LOW (ref 0.30–0.70)

## 2019-05-13 MED ORDER — FENTANYL CITRATE (PF) 100 MCG/2ML IJ SOLN
12.5000 ug | INTRAMUSCULAR | Status: DC | PRN
  Filled 2019-05-13: qty 2

## 2019-05-13 MED ORDER — GLYCOPYRROLATE 1 MG PO TABS: 1.0000 mg | ORAL_TABLET | ORAL | Status: DC | PRN

## 2019-05-13 MED ORDER — HALOPERIDOL 0.5 MG PO TABS: 0.5000 mg | ORAL_TABLET | ORAL | Status: DC | PRN

## 2019-05-13 MED ORDER — LORAZEPAM 0.5 MG PO TABS
0.5000 mg | ORAL_TABLET | Freq: Four times a day (QID) | ORAL | Status: DC | PRN
  Filled 2019-05-13: qty 1

## 2019-05-13 MED ORDER — MELATONIN 5 MG PO TABS
10.0000 mg | ORAL_TABLET | Freq: Every evening | ORAL | Status: DC | PRN
  Administered 2019-05-14: 10 mg via ORAL
  Filled 2019-05-13: qty 2

## 2019-05-13 MED ORDER — HYDROMORPHONE HCL 1 MG/ML IJ SOLN
0.5000 mg | INTRAMUSCULAR | Status: DC | PRN
  Administered 2019-05-13 – 2019-05-14 (×3): 0.5 mg via INTRAVENOUS
  Filled 2019-05-13: qty 1
  Filled 2019-05-13: qty 0.5

## 2019-05-13 MED ORDER — SODIUM CHLORIDE 0.9 % IV SOLN
250.0000 mL | INTRAVENOUS | Status: DC
  Administered 2019-05-13: 250 mL via INTRAVENOUS

## 2019-05-13 MED ORDER — LORAZEPAM 2 MG/ML PO CONC
1.0000 mg | ORAL | Status: DC | PRN
  Filled 2019-05-13: qty 0.5

## 2019-05-13 MED ORDER — HEPARIN (PORCINE) 25000 UT/250ML-% IV SOLN
850.0000 [IU]/h | INTRAVENOUS | Status: DC
  Administered 2019-05-13: 850 [IU]/h via INTRAVENOUS

## 2019-05-13 MED ORDER — SODIUM CHLORIDE 0.9 % IV SOLN
1.0000 g | INTRAVENOUS | Status: DC
  Filled 2019-05-13: qty 10

## 2019-05-13 MED ORDER — METOPROLOL TARTRATE 5 MG/5ML IV SOLN
5.0000 mg | INTRAVENOUS | Status: DC | PRN
  Administered 2019-05-13 (×2): 5 mg via INTRAVENOUS
  Filled 2019-05-13 (×2): qty 5

## 2019-05-13 MED ORDER — GLYCOPYRROLATE 0.2 MG/ML IJ SOLN: 0.2000 mg | INTRAMUSCULAR | Status: DC | PRN

## 2019-05-13 MED ORDER — POLYVINYL ALCOHOL 1.4 % OP SOLN
1.0000 [drp] | Freq: Four times a day (QID) | OPHTHALMIC | Status: DC | PRN
  Filled 2019-05-13: qty 15

## 2019-05-13 MED ORDER — BIOTENE DRY MOUTH MT LIQD: 15.0000 mL | OROMUCOSAL | Status: DC | PRN

## 2019-05-13 MED ORDER — PHENYLEPHRINE HCL-NACL 10-0.9 MG/250ML-% IV SOLN
25.0000 ug/min | INTRAVENOUS | Status: DC
  Administered 2019-05-13: 25 ug/min via INTRAVENOUS
  Filled 2019-05-13: qty 250

## 2019-05-13 MED ORDER — METOPROLOL TARTRATE 12.5 MG HALF TABLET: 12.5000 mg | ORAL_TABLET | Freq: Two times a day (BID) | ORAL | Status: DC

## 2019-05-13 MED ORDER — LORAZEPAM 2 MG/ML IJ SOLN
1.0000 mg | INTRAMUSCULAR | Status: DC | PRN
  Administered 2019-05-13: 20:00:00 1 mg via INTRAVENOUS
  Filled 2019-05-13: qty 1

## 2019-05-13 MED ORDER — METOPROLOL TARTRATE 25 MG PO TABS: 25.0000 mg | ORAL_TABLET | Freq: Two times a day (BID) | ORAL | Status: DC

## 2019-05-13 MED ORDER — HALOPERIDOL LACTATE 5 MG/ML IJ SOLN
0.5000 mg | INTRAMUSCULAR | Status: DC | PRN
  Administered 2019-05-14: 0.5 mg via INTRAVENOUS
  Filled 2019-05-13: qty 1

## 2019-05-13 MED ORDER — HALOPERIDOL LACTATE 2 MG/ML PO CONC
0.5000 mg | ORAL | Status: DC | PRN
  Filled 2019-05-13: qty 0.3

## 2019-05-13 MED ORDER — LORAZEPAM 1 MG PO TABS: 1.0000 mg | ORAL_TABLET | ORAL | Status: DC | PRN

## 2019-05-13 NOTE — Progress Notes (Signed)
I called the patient's nurse to check in on her heart rhythm.  The patient has converted back to her paced rhythm with 1 dose of IV metoprolol for rate control.  The patient's daughter Jackelyn Poling was present at the bedside and I requested to speak with her.  [name] and I discussed that Ms. Salatino has said for several weeks now that she would like to pass at home, and is not keen to have any additional hospital-based care.  [name] and I had a educated discussion about the indications for the phenylephrine and metoprolol to relieve LVOT obstruction primarily for symptom management.  [name] said that she understood this plan of care, but based on the patient's wishes, as well as the wishes of her other family members involved in the patient's care, they would like no additional drips and no further escalation of medical therapy in hospital.  I confirmed several times that she hopes to take the patient home and she understands that severe left ventricular outflow tract obstruction, particularly if the patient goes back into atrial fibrillation, could lead to the patient's demise via hypotension.  She demonstrates understanding of this and requests the phenylephrine to be discontinued as well as the added metoprolol.  I discussed this with both Dr. Shearon Stalls in critical care as well as Dr. Tawanna Solo. Palliative care will be consulted for symptom management and home going coordination. I have discontinued the phenylephrine and metoprolol. Jackelyn Poling was appreciative for all teams' efforts.

## 2019-05-13 NOTE — Progress Notes (Addendum)
Progress Note  Patient Name: Sherri J Stroder Talmadge ChadDate of Encounter: 05/13/2019  Primary Cardiologist: Thurmon FairMihai Croitoru, MD  Subjective   Continues to have significant SOB with speaking. Borderline BP's. Denies chest pain.   Inpatient Medications    Scheduled Meds: . aspirin EC  81 mg Oral Daily  . Chlorhexidine Gluconate Cloth  6 each Topical Daily  . mouth rinse  15 mL Mouth Rinse BID   Continuous Infusions: . ceFEPime (MAXIPIME) IV Stopped (05/12/19 1535)  . heparin 850 Units/hr (05/13/19 16100605)  . lactated ringers 50 mL/hr at 05/13/19 0605   PRN Meds: acetaminophen, albuterol, Melatonin, ondansetron (ZOFRAN) IV   Vital Signs    Vitals:   05/13/19 0000 05/13/19 0400 05/13/19 0500 05/13/19 0530  BP: (!) 102/48 (!) 112/45    Pulse: 71 70  70  Resp: (!) 23 (!) 30  (!) 32  Temp: 98.8 F (37.1 C) 97.9 F (36.6 C)    TempSrc: Oral Axillary    SpO2: 98% 100%  95%  Weight:   63 kg   Height:        Intake/Output Summary (Last 24 hours) at 05/13/2019 0754 Last data filed at 05/13/2019 0605 Gross per 24 hour  Intake 1828.59 ml  Output 390 ml  Net 1438.59 ml   Filed Weights   05/11/19 0314 05/12/19 0354 05/13/19 0500  Weight: 59.3 kg 59.3 kg 63 kg    Physical Exam   General: Ill appearing, NAD Skin: Warm, dry, intact  Neck: Negative for carotid bruits. No JVD Lungs: RRR, mildly labored. Diminished in lower lobes.  Cardiovascular: RRR with S1 S2. + murmur Abdomen: Soft, non-tender, non-distended. No obvious abdominal masses. Neuro: Alert and oriented. No focal deficits. No facial asymmetry. MAE spontaneously. Psych: Responds to questions appropriately with normal affect.    Labs    Chemistry Recent Labs  Lab 05/10/19 1707 05/11/19 0453 05/12/19 1202 05/13/19 0159  NA 140 140 138 139  K 4.3 4.8 4.1 4.2  CL 105 106 106 108  CO2 26 25 21* 22  GLUCOSE 105* 134* 122* 100*  BUN 25* 28* 49* 49*  CREATININE 1.85* 2.03* 2.25* 1.97*  CALCIUM 9.2 9.1 9.1  9.1  PROT 6.2*  --   --   --   ALBUMIN 3.6  --   --   --   AST 28  --   --   --   ALT 9  --   --   --   ALKPHOS 42  --   --   --   BILITOT 0.8  --   --   --   GFRNONAA 24* 21* 19* 22*  GFRAA 27* 25* 22* 25*  ANIONGAP 9 9 11 9      Hematology Recent Labs  Lab 05/11/19 0453 05/12/19 0226 05/13/19 0159  WBC 12.8* 15.2* 15.4*  RBC 3.74* 3.20* 3.07*  HGB 11.8* 10.3* 9.5*  HCT 38.0 32.0* 30.7*  MCV 101.6* 100.0 100.0  MCH 31.6 32.2 30.9  MCHC 31.1 32.2 30.9  RDW 14.4 14.4 15.0  PLT 197 136* 134*    Cardiac EnzymesNo results for input(s): TROPONINI in the last 168 hours. No results for input(s): TROPIPOC in the last 168 hours.   BNP Recent Labs  Lab 05/10/19 2033  BNP 367.4*     DDimer  Recent Labs  Lab 05/10/19 1707  DDIMER 1.49*     Radiology    ECHOCARDIOGRAM COMPLETE  Result Date: 05/11/2019   ECHOCARDIOGRAM REPORT   Patient  Name:   [name] Date of Exam: 05/11/2019 Medical Rec #:  [id number]       Height:       64.0 in Accession #:    [id number]      Weight:       130.7 lb Date of Birth:  [birth year]        BSA:          1.63 m Patient Age:    85 years        BP:           87/50 mmHg Patient Gender: F               HR:           70 bpm. Exam Location:  Inpatient Procedure: 2D Echo, Cardiac Doppler and Color Doppler Indications:    Acute MI  History:        Patient has prior history of Echocardiogram examinations, most                 recent 06/25/2018. CHF, Pacemaker, Arrythmias:SSS and Atrial                 Fibrillation; Signs/Symptoms:Chest Pain, Shortness of Breath and                 Hypotension. CKD, elevated Troponin.  Sonographer:    Dustin Flock Referring Phys: [id number] [facility] ALI IMPRESSIONS  1. Left ventricular ejection fraction, by visual estimation, is 40 to 45%. The left ventricle has moderately decreased function. There is mildly increased left ventricular hypertrophy.  2. The left ventricle demonstrates regional wall motion abnormalities.  3.  Severe hypokinesis of the left ventricular, mid-apical anteroseptal wall, anterior wall, apical segment and inferoapical segment. Findings could be consistent with LAD territory MI vs Takatsubo cardiomyopathy.  4. Left ventricular diastolic parameters are consistent with Grade II diastolic dysfunction (pseudonormalization).  5. Mildly elevated pulmonary artery systolic pressure.  6. Global right ventricle has mildly reduced systolic function.The right ventricular size is normal. No increase in right ventricular wall thickness.  7. The aortic valve is tricuspid. Aortic valve regurgitation is trivial. Mild aortic valve stenosis. There is a dynamic LVOT gradient that is mostly responsivly for the increased transvalvular aortic gradient.  8. Aortic valve peak gradient measures 68.6 mmHg.  9. Aortic valve mean gradient measures 39.0 mmHg. 10. Right atrial size was mildly dilated. 11. Elevated left atrial and left ventricular end-diastolic pressures. 12. Left atrial size was mildly dilated. 13. Mild to moderate mitral annular calcification. 14. The mitral valve is abnormal. Mild mitral valve regurgitation. 15. The tricuspid valve is grossly normal. 16. The pulmonic valve was grossly normal. Pulmonic valve regurgitation is not visualized. 17. The inferior vena cava is dilated in size with <50% respiratory variability, suggesting right atrial pressure of 15 mmHg. 18. The tricuspid regurgitant velocity is 2.43 m/s, and with an assumed right atrial pressure of 15 mmHg, the estimated right ventricular systolic pressure is mildly elevated at 38.6 mmHg. FINDINGS  Left Ventricle: Left ventricular ejection fraction, by visual estimation, is 40 to 45%. The left ventricle has moderately decreased function. Severe hypokinesis of the left ventricular, mid-apical anteroseptal wall, anterior wall, apical segment and inferoapical segment. The left ventricle demonstrates regional wall motion abnormalities. There is mildly increased left  ventricular hypertrophy. Left ventricular diastolic parameters are consistent with Grade II diastolic dysfunction (pseudonormalization). Elevated left atrial and left ventricular end-diastolic pressures. Right Ventricle: The right ventricular size is normal. No increase  in right ventricular wall thickness. Global RV systolic function is has mildly reduced systolic function. The tricuspid regurgitant velocity is 2.43 m/s, and with an assumed right atrial pressure of 15 mmHg, the estimated right ventricular systolic pressure is mildly elevated at 38.6 mmHg. Left Atrium: Left atrial size was mildly dilated. Right Atrium: Right atrial size was mildly dilated Pericardium: There is no evidence of pericardial effusion. Mitral Valve: The mitral valve is abnormal. Mild to moderate mitral annular calcification. Mild mitral valve regurgitation, with posteriorly-directed jet. Tricuspid Valve: The tricuspid valve is grossly normal. Tricuspid valve regurgitation is trivial. Aortic Valve: The aortic valve is tricuspid. Aortic valve regurgitation is trivial. Mild aortic stenosis is present. Aortic valve mean gradient measures 39.0 mmHg. Aortic valve peak gradient measures 68.6 mmHg. Aortic valve area, by VTI measures 1.02 cm. Pulmonic Valve: The pulmonic valve was grossly normal. Pulmonic valve regurgitation is not visualized. Pulmonic regurgitation is not visualized. Aorta: The aortic root and ascending aorta are structurally normal, with no evidence of dilitation. Venous: The inferior vena cava is dilated in size with less than 50% respiratory variability, suggesting right atrial pressure of 15 mmHg. IAS/Shunts: No atrial level shunt detected by color flow Doppler.  LEFT VENTRICLE PLAX 2D LVIDd:         4.30 cm  Diastology LVIDs:         3.10 cm  LV e' lateral:   5.78 cm/s LV PW:         1.10 cm  LV E/e' lateral: 24.9 LV IVS:        0.90 cm  LV e' medial:    4.73 cm/s LVOT diam:     1.80 cm  LV E/e' medial:  30.4 LV SV:          45 ml LV SV Index:   27.52 LVOT Area:     2.54 cm  RIGHT VENTRICLE RV Basal diam:  2.20 cm RV S prime:     6.83 cm/s TAPSE (M-mode): 2.2 cm LEFT ATRIUM           Index LA diam:      4.50 cm 2.76 cm/m LA Vol (A4C): 56.5 ml 34.60 ml/m  AORTIC VALVE AV Area (Vmax):    0.96 cm AV Area (Vmean):   0.90 cm AV Area (VTI):     1.02 cm AV Vmax:           414.00 cm/s AV Vmean:          288.000 cm/s AV VTI:            0.981 m AV Peak Grad:      68.6 mmHg AV Mean Grad:      39.0 mmHg LVOT Vmax:         155.50 cm/s LVOT Vmean:        102.050 cm/s LVOT VTI:          0.393 m LVOT/AV VTI ratio: 0.40  AORTA Ao Root diam: 3.00 cm MITRAL VALVE                         TRICUSPID VALVE MV Area (PHT): 2.91 cm              TR Peak grad:   23.6 mmHg MV PHT:        75.69 msec            TR Vmax:        244.00 cm/s MV Decel Time: 261  msec MV E velocity: 144.00 cm/s 103 cm/s  SHUNTS MV A velocity: 66.50 cm/s  70.3 cm/s Systemic VTI:  0.39 m MV E/A ratio:  2.17        1.5       Systemic Diam: 1.80 cm  Sherri Shutter MD Electronically signed by Sherri Shutter MD Signature Date/Time: 05/11/2019/2:49:42 PM    Final    Telemetry    05/13/2019 AV paced - Personally Reviewed  ECG    No new tracing as of 05/13/2019 - Personally Reviewed  Cardiac Studies   ECHO:  12/28 1. Left ventricular ejection fraction, by visual estimation, is 40 to 45%. The left ventricle has moderately decreased function. There is mildly increased left ventricular hypertrophy. 2. The left ventricle demonstrates regional wall motion abnormalities. 3. Severe hypokinesis of the left ventricular, mid-apical anteroseptal wall, anterior wall, apical segment and inferoapical segment. Findings could be consistent with LAD territory MI vs Takatsubo cardiomyopathy. 4. Left ventricular diastolic parameters are consistent with Grade II diastolic dysfunction (pseudonormalization). 5. Mildly elevated pulmonary artery systolic pressure. 6. Global right ventricle has  mildly reduced systolic function.The right ventricular size is normal. No increase in right ventricular wall thickness. 7. The aortic valve is tricuspid. Aortic valve regurgitation is trivial. Mild aortic valve stenosis. There is a dynamic LVOT gradient that is mostly responsivly for the increased transvalvular aortic gradient. 8. Aortic valve peak gradient measures 68.6 mmHg. 9. Aortic valve mean gradient measures 39.0 mmHg. 10. Right atrial size was mildly dilated. 11. Elevated left atrial and left ventricular end-diastolic pressures. 12. Left atrial size was mildly dilated. 13. Mild to moderate mitral annular calcification. 14. The mitral valve is abnormal. Mild mitral valve regurgitation. 15. The tricuspid valve is grossly normal. 16. The pulmonic valve was grossly normal. Pulmonic valve regurgitation is not visualized. 17. The inferior vena cava is dilated in size with <50% respiratory variability, suggesting right atrial pressure of 15 mmHg. 18. The tricuspid regurgitant velocity is 2.43 m/s, and with an assumed right atrial pressure of 15 mmHg, the estimated right ventricular systolic pressure is mildly elevated at 38.6 mmHg.  Patient Profile     85 y.o. female w/ hx PAT, SSS s/p MDT PPM 1994 w/ last gen change 10/2017, D-CHF, CKD III, nl MV 2010, recent UTI rx w/ Bactrim, was admitted 12/27 with presyncope and hypotension, seen by cards for elevated troponin  Assessment & Plan    1. NSTEMI with new LVOT/systolic dysfunction per echocardiogram: -Elevated troponin with abnormal echocardiogram performed 05/11/2019 -Per chart review echo demonstrated significant LVOT obstruction thought possibly to be secondary to LAD ischemia with septal and apical hypokinesis versus Takotsubo cardiomyopathy from overall illness -Further ischemic evaluation deferred at this time given poor renal function and overall illness however would be warranted if within goals of care -Continue heparin gtt,  statin, ASA -No BB/ACEI in the setting of hypotension and renal failure -Phenylephrine trial to to relieve LVOT obstruction>> follow symptoms -Continues to have significant SOB with speaking>>BPs more stable with SBPs in the low 100's  2.  Severe sepsis, hypotension: -Recent treatment for Klebsiella UTI presented with sepsis>> fluid volume resuscitated -On empiric cefepime -Followed by PCCM -Received albumin/LR infusion for BP support -Vasopressor with phenylephrine 12/29  3.  Acute on chronic CKD stage III: -Admission creatinine 1.85 with a baseline of 1.2-1.3 -Continue to avoid nephrotoxic medications -Creatinine today, 1.97 today>>improved from 2.25 yesterday   Other hospital concerns per primary team: -Emphysema/COPD -Respiratory failure -Encephalopathy   Signed, Georgie Chard NP-C  [facility] Pager: [phone] 05/13/2019, 7:54 AM     For questions or updates, please contact   Please consult [email or web address] for contact info under Cardiology/STEMI.  Patient seen and examined with Georgie Chard, NP-C.  Agree as above, with the following exceptions and changes as noted below.  Ms. Botts still feels poorly today and is dyspneic even sitting in bed.  I am highly suspicious this is related to outflow tract obstruction due to her apical akinesis and hyperdynamic basal segments.  Again it is difficult to determine whether this represents LAD ischemia as we cannot take her to the cardiac catheterization lab with her poor renal function versus a Takotsubo cardiomyopathy.  Echocardiogram suggests involvement of the apex perhaps in more than 1 vascular distribution, which may lean towards a Takotsubo etiology. Gen: Dyspneic but pleasant in no significant distress, CV: RRR, 2/6 systolic murmur, Lungs: clear, Abd: soft, Extrem: Warm, well perfused, trace pedal edema, Neuro/Psych: alert and oriented x 3, normal mood and affect. All available labs, radiology testing, previous records reviewed.    -Attempt increase in afterload to relieve LVOT obstruction with phenylephrine.  We will then add metoprolol while on blood pressure support to improve the hemodynamics of her LVOT obstruction.  ADDENDUM: I received a call from the patient's nurse Lani that the patient had gone into atrial fibrillation with rapid ventricular response.  She has been on the phenylephrine for approximately 2 hours.  It is unlikely that the phenylephrine is contributed to her A. fib RVR given that phenylephrine is an alpha agonist.  I am more suspicious that due to critical illness and increased volume status, that may be a driver of atrial fibrillation.  Nevertheless she will require the blood pressure support at this point since rapid ventricular response will likely worsen the hemodynamics of her LVOT obstruction.  I have requested that the nurse continue the phenylephrine infusion and give IV metoprolol for rate control.  The IV metoprolol should be given while she is on blood pressure support.  I have offered to present to the bedside, however the nurse feels that is not necessary at this time.   If IV metoprolol is not successful in controlling her rates and she appears more unwell, consider IV amiodarone.  If additional rate control is needed can consider IV digoxin in the acute setting, being mindful of the fact that she has acute renal failure, it is unlikely we will continue digoxin as a maintenance medication.  Parke Poisson 05/13/19 12:57 PM  CRITICAL CARE Performed by: Weston Brass, MD   Total critical care time: 35 minutes   Critical care time was exclusive of separately billable procedures and treating other patients.   Critical care was necessary to treat or prevent imminent or life-threatening deterioration.   Critical care was time spent personally by me (independent of APPs or residents) on the following activities: development of treatment plan with patient and/or surrogate as well as  nursing, discussions with consultants, evaluation of patient's response to treatment, examination of patient, obtaining history from patient or surrogate, ordering and performing treatments and interventions, ordering and review of laboratory studies, ordering and review of radiographic studies, pulse oximetry and re-evaluation of patient's condition.

## 2019-05-13 NOTE — Progress Notes (Signed)
Patient signed out to [facility] on 12/29 - [facility] to assume care today. Cardiology following. PCCM will sign off; please don't hesitate to contact us if we can be of further assistance.

## 2019-05-13 NOTE — Progress Notes (Addendum)
PROGRESS NOTE    [name]  YNW:[id number] DOB: 23-Jan-1928 DOA: 05/10/2019 PCP: Joselyn Arrow, MD   Brief Narrative:  Patient is a 85 year old female who presented from her home to the emergency department with complaints of presyncopal symptoms.  She was found to be hypotensive on presentation.  She was diagnosed with urinary tract infection about 2 weeks ago and was treated with antibiotics.  She was feeling dizziness at home.  She was found to have AKI on presentation, elevated troponin, elevated lactate concerning for sepsis.  CT chest/abdomen/pelvis showed pulmonary edema, trace effusions.  She was admitted under PCCM service.  Patient was handed over to hospitalist team on 05/13/2019.  Cardiology also following. Currently being managed for NSTEMI, possible Takotsubo cardiomyopathy.  Currently on heparin drip.  Also on empiric antibiotics for UTI.  Assessment & Plan:   Principal Problem:   Severe sepsis (HCC) Active Problems:   Chronic diastolic heart failure (HCC)   Elevated troponin   Acute kidney injury superimposed on chronic kidney disease (HCC)  Suspected severe sepsis: She was recently treated for Klebsiella UTI as an outpatient with Macrobid, Bactrim.  Presented with lactic acidosis.  She was found to be dehydrated on presentation and was given IV fluids.  She has been started on cefepime here, plan to complete 5 days course for complicated UTI.  Takotsubo cardiomyopathy/LVOT obstruction: Presented with elevated troponin.  Denies any chest pain.  Cardiology following.  Echocardiogram showed ejection fraction of 40 to 45%, moderately decreased left ventricular function, severe hypokinesis suggesting Takotsubo cardiomyopathy, grade 2 diastolic dysfunction., left ventricular outlet obstruction. Plan is to start phenylephrine by cardiology.  Troponin elevation/NSTEMI: Possible ischemic etiology versus Takotsubo.  Continue heparin drip.  Cardiology following.  Denies any chest  pain.  H/O Sinus node dysfunction: Has pacemaker.Follows with Dr Royann Shivers  Acute hypoxic/hypercarbic respiratory failure: Currently requiring oxygen supplementation for maintenance of saturation.  CT showed interstitial edema, bilateral pleural effusions.  Continue to wean the oxygen.  AKI on CKD stage III: Baseline creatinine of 1.2-1.3.  On presentation her creatinine was 1.5.  Likely multifactorial in the setting of UTI, antibiotic use, poor oral intake.  Improvement in kidney function today.  Continue to monitor input/output, avoid nephrotoxic drugs.  History of emphysema/COPD: Currently not in exacerbation.  Decreased bilateral air entry on auscultation.  Continue bronchodilators as needed.  Acute encephalopathy/delirium: Likely have improved.  Currently she is alert, awake and oriented x4.  Debility/deconditioning: We will request a PT/OT evaluation when appropriate.  Patient lives alone at home.  Ambulates with the help of walker.            DVT prophylaxis:Heparin Code Status:DNR  Family Communication: None present at the bedside Disposition Plan: Home versus skilled nursing facility when medically stable   Consultants: Cardiology, PCCM  Procedures: Echocardiogram  Antimicrobials:  Anti-infectives (From admission, onward)   Start     Dose/Rate Route Frequency Ordered Stop   05/11/19 1600  ceFEPIme (MAXIPIME) 2 g in sodium chloride 0.9 % 100 mL IVPB     2 g 200 mL/hr over 30 Minutes Intravenous Every 24 hours 05/10/19 2225 05/16/19 1559   05/10/19 1700  ceFEPIme (MAXIPIME) 2 g in sodium chloride 0.9 % 100 mL IVPB     2 g 200 mL/hr over 30 Minutes Intravenous  Once 05/10/19 1653 05/10/19 1848   05/10/19 1700  vancomycin (VANCOCIN) IVPB 1000 mg/200 mL premix     1,000 mg 200 mL/hr over 60 Minutes Intravenous  Once 05/10/19 1653 05/10/19  2106      Subjective: Patient seen and examined at bedside this morning.  Hemodynamically stable.  Very weak, debilitated,  deconditioned but alert and oriented.  Complains of shortness of breath but denies any chest pain.  Objective: Vitals:   05/13/19 0400 05/13/19 0500 05/13/19 0530 05/13/19 0800  BP: (!) 112/45   (!) 102/53  Pulse: 70  70 69  Resp: (!) 30  (!) 32 (!) 25  Temp: 97.9 F (36.6 C)   (!) 97.5 F (36.4 C)  TempSrc: Axillary   Oral  SpO2: 100%  95% 97%  Weight:  63 kg    Height:        Intake/Output Summary (Last 24 hours) at 05/13/2019 [id number] Last data filed at 05/13/2019 [id number] Gross per 24 hour  Intake 1757.21 ml  Output 390 ml  Net 1367.21 ml   Filed Weights   05/11/19 0314 05/12/19 0354 05/13/19 0500  Weight: 59.3 kg 59.3 kg 63 kg    Examination:  General exam: Elderly pleasant female, deconditioned, debilitated HEENT:PERRL, Ear/Nose normal on gross exam Respiratory system: Bilateral severely decreased air entry, occasional wheezes Cardiovascular system: S1 & S2 heard, RRR. No JVD, murmurs, rubs, gallops or clicks. No pedal edema. Gastrointestinal system: Abdomen is nondistended, soft and nontender. No organomegaly or masses felt. Normal bowel sounds heard. Central nervous system: Alert and oriented. No focal neurological deficits. Extremities: No edema, no clubbing ,no cyanosis, distal peripheral pulses palpable. Skin: No rashes, lesions or ulcers,no icterus ,no pallor    Data Reviewed: I have personally reviewed following labs and imaging studies  CBC: Recent Labs  Lab 05/10/19 1707 05/11/19 0453 05/12/19 0226 05/13/19 0159  WBC 9.4 12.8* 15.2* 15.4*  NEUTROABS 6.6  --   --   --   HGB 12.5 11.8* 10.3* 9.5*  HCT 39.8 38.0 32.0* 30.7*  MCV 100.0 101.6* 100.0 100.0  PLT 201 197 136* 134*   Basic Metabolic Panel: Recent Labs  Lab 05/10/19 1707 05/11/19 0453 05/12/19 1202 05/13/19 0159  NA 140 140 138 139  K 4.3 4.8 4.1 4.2  CL 105 106 106 108  CO2 26 25 21* 22  GLUCOSE 105* 134* 122* 100*  BUN 25* 28* 49* 49*  CREATININE 1.85* 2.03* 2.25* 1.97*  CALCIUM  9.2 9.1 9.1 9.1  MG  --  1.8  --   --   PHOS  --  3.5  --   --    GFR: Estimated Creatinine Clearance: 16.7 mL/min (A) (by C-G formula based on SCr of 1.97 mg/dL (H)). Liver Function Tests: Recent Labs  Lab 05/10/19 1707  AST 28  ALT 9  ALKPHOS 42  BILITOT 0.8  PROT 6.2*  ALBUMIN 3.6   No results for input(s): LIPASE, AMYLASE in the last 168 hours. No results for input(s): AMMONIA in the last 168 hours. Coagulation Profile: Recent Labs  Lab 05/10/19 1707  INR 1.1   Cardiac Enzymes: No results for input(s): CKTOTAL, CKMB, CKMBINDEX, TROPONINI in the last 168 hours. BNP (last 3 results) No results for input(s): PROBNP in the last 8760 hours. HbA1C: Recent Labs    05/11/19 0459  HGBA1C 5.3   CBG: Recent Labs  Lab 05/11/19 1339  GLUCAP 138*   Lipid Profile: Recent Labs    05/11/19 0453  CHOL 136  HDL 49  LDLCALC 74  TRIG 67  CHOLHDL 2.8   Thyroid Function Tests: No results for input(s): TSH, T4TOTAL, FREET4, T3FREE, THYROIDAB in the last 72 hours. Anemia Panel: No results  for input(s): VITAMINB12, FOLATE, FERRITIN, TIBC, IRON, RETICCTPCT in the last 72 hours. Sepsis Labs: Recent Labs  Lab 05/10/19 1708 05/10/19 1901 05/10/19 2252  LATICACIDVEN 1.8 3.1* 1.7    Recent Results (from the past 240 hour(s))  Culture, blood (Routine x 2)     Status: None (Preliminary result)   Collection Time: 05/10/19  4:58 PM   Specimen: BLOOD  Result Value Ref Range Status   Specimen Description   Final    BLOOD RIGHT HAND Performed at [facility], [address] [place], [place] [address]    Special Requests   Final    BOTTLES DRAWN AEROBIC AND ANAEROBIC Blood Culture adequate volume Performed at [facility], [address], [place], [place] [address]    Culture   Final    NO GROWTH 2 DAYS Performed at [facility], [address], [place], [place] [address]    Report Status PENDING  Incomplete  Culture, blood  (Routine x 2)     Status: None (Preliminary result)   Collection Time: 05/10/19  5:08 PM   Specimen: BLOOD LEFT HAND  Result Value Ref Range Status   Specimen Description   Final    BLOOD LEFT HAND Performed at [facility], [address], [place], [place] [address]    Special Requests   Final    BOTTLES DRAWN AEROBIC AND ANAEROBIC Blood Culture adequate volume Performed at [facility], [address] [place], [place] [address]    Culture   Final    NO GROWTH 2 DAYS Performed at [facility], [address], [place], [place] [address]    Report Status PENDING  Incomplete  Urine culture     Status: None   Collection Time: 05/10/19  7:01 PM   Specimen: In/Out Cath Urine  Result Value Ref Range Status   Specimen Description   Final    IN/OUT CATH URINE Performed at [facility], [address] [place], [place] [address]    Special Requests   Final    NONE Performed at [facility], [address], [place], [place] [address]    Culture   Final    NO GROWTH Performed at [facility], [address], [place], [place] [address]    Report Status 05/11/2019 FINAL  Final  Respiratory Panel by RT PCR (Flu A&B, Covid) - Nasopharyngeal Swab     Status: None   Collection Time: 05/10/19  9:05 PM   Specimen: Nasopharyngeal Swab  Result Value Ref Range Status   SARS Coronavirus 2 by RT PCR NEGATIVE NEGATIVE Final    Comment: (NOTE) SARS-CoV-2 target nucleic acids are NOT DETECTED. The SARS-CoV-2 RNA is generally detectable in upper respiratoy specimens during the acute phase of infection. The lowest concentration of SARS-CoV-2 viral copies this assay can detect is 131 copies/mL. A negative result does not preclude SARS-Cov-2 infection and should not be used as the sole basis for treatment or other patient management decisions. A negative result may occur with  improper specimen  collection/handling, submission of specimen other than nasopharyngeal swab, presence of viral mutation(s) within the areas targeted by this assay, and inadequate number of viral copies (<131 copies/mL). A negative result must be combined with clinical observations, patient history, and epidemiological information. The expected result is Negative. Fact Sheet for Patients:  https://www.moore.com/ Fact Sheet for Healthcare Providers:  https://www.young.biz/ This test is not yet ap proved or  cleared by the [place] and  has been authorized for detection and/or diagnosis of SARS-CoV-2 by FDA under an Emergency Use Authorization (EUA). This EUA will remain  in effect (meaning this test can be used) for the duration of the COVID-19 declaration under Section 564(b)(1) of the Act, 21 U.S.C. section 360bbb-3(b)(1), unless the authorization is terminated or revoked sooner.    Influenza A by PCR NEGATIVE NEGATIVE Final   Influenza B by PCR NEGATIVE NEGATIVE Final    Comment: (NOTE) The Xpert Xpress SARS-CoV-2/FLU/RSV assay is intended as an aid in  the diagnosis of influenza from Nasopharyngeal swab specimens and  should not be used as a sole basis for treatment. Nasal washings and  aspirates are unacceptable for Xpert Xpress SARS-CoV-2/FLU/RSV  testing. Fact Sheet for Patients: https://www.moore.com/ Fact Sheet for Healthcare Providers: https://www.young.biz/ This test is not yet approved or cleared by the [place] FDA and  has been authorized for detection and/or diagnosis of SARS-CoV-2 by  FDA under an Emergency Use Authorization (EUA). This EUA will remain  in effect (meaning this test can be used) for the duration of the  Covid-19 declaration under Section 564(b)(1) of the Act, 21  U.S.C. section 360bbb-3(b)(1), unless the authorization is  terminated or revoked. Performed at [facility], [address] [place], [place] [address]   MRSA PCR Screening     Status: None   Collection Time: 05/11/19  3:14 AM   Specimen: Nasal Mucosa; Nasopharyngeal  Result Value Ref Range Status   MRSA by PCR NEGATIVE NEGATIVE Final    Comment:        The GeneXpert MRSA Assay (FDA approved for NASAL specimens only), is one component of a comprehensive MRSA colonization surveillance program. It is not intended to diagnose MRSA infection nor to guide or monitor treatment for MRSA infections. Performed at [facility], [address], [place], [place] [address]          Radiology Studies: ECHOCARDIOGRAM COMPLETE  Result Date: 05/11/2019   ECHOCARDIOGRAM REPORT   Patient Name:   JOCEE KISSICK Date of Exam: 05/11/2019 Medical Rec #:  [id number]       Height:       64.0 in Accession #:    [id number]      Weight:       130.7 lb Date of Birth:  [birth year]        BSA:          1.63 m Patient Age:    89 years        BP:           87/50 mmHg Patient Gender: F               HR:           70 bpm. Exam Location:  Inpatient Procedure: 2D Echo, Cardiac Doppler and Color Doppler Indications:    Acute MI  History:        Patient has prior history of Echocardiogram examinations, most                 recent 06/25/2018. CHF, Pacemaker, Arrythmias:SSS and Atrial                 Fibrillation; Signs/Symptoms:Chest Pain, Shortness of Breath and                 Hypotension. CKD, elevated Troponin.  Sonographer:    Lavenia Atlas Referring Phys: [id number] [facility] ALI IMPRESSIONS  1. Left ventricular ejection fraction, by visual estimation, is 40 to 45%. The left ventricle has moderately decreased function. There is mildly increased left ventricular hypertrophy.  2. The left ventricle demonstrates regional wall motion abnormalities.  3. Severe hypokinesis of the left ventricular, mid-apical anteroseptal wall, anterior wall, apical segment and inferoapical segment. Findings could be  consistent with LAD territory MI vs Takatsubo cardiomyopathy.  4. Left ventricular diastolic parameters are consistent with Grade II diastolic dysfunction (pseudonormalization).  5. Mildly elevated pulmonary artery systolic pressure.  6. Global right ventricle has mildly reduced systolic function.The right ventricular size is normal. No increase in right ventricular wall thickness.  7. The aortic valve is tricuspid. Aortic valve regurgitation is trivial. Mild aortic valve stenosis. There is a dynamic LVOT gradient that is mostly responsivly for the increased transvalvular aortic gradient.  8. Aortic valve peak gradient measures 68.6 mmHg.  9. Aortic valve mean gradient measures 39.0 mmHg. 10. Right atrial size was mildly dilated. 11. Elevated left atrial and left ventricular end-diastolic pressures. 12. Left atrial size was mildly dilated. 13. Mild to moderate mitral annular calcification. 14. The mitral valve is abnormal. Mild mitral valve regurgitation. 15. The tricuspid valve is grossly normal. 16. The pulmonic valve was grossly normal. Pulmonic valve regurgitation is not visualized. 17. The inferior vena cava is dilated in size with <50% respiratory variability, suggesting right atrial pressure of 15 mmHg. 18. The tricuspid regurgitant velocity is 2.43 m/s, and with an assumed right atrial pressure of 15 mmHg, the estimated right ventricular systolic pressure is mildly elevated at 38.6 mmHg. FINDINGS  Left Ventricle: Left ventricular ejection fraction, by visual estimation, is 40 to 45%. The left ventricle has moderately decreased function. Severe hypokinesis of the left ventricular, mid-apical anteroseptal wall, anterior wall, apical segment and inferoapical segment. The left ventricle demonstrates regional wall motion abnormalities. There is mildly increased left ventricular hypertrophy. Left ventricular diastolic parameters are consistent with Grade II diastolic dysfunction (pseudonormalization). Elevated  left atrial and left ventricular end-diastolic pressures. Right Ventricle: The right ventricular size is normal. No increase in right ventricular wall thickness. Global RV systolic function is has mildly reduced systolic function. The tricuspid regurgitant velocity is 2.43 m/s, and with an assumed right atrial pressure of 15 mmHg, the estimated right ventricular systolic pressure is mildly elevated at 38.6 mmHg. Left Atrium: Left atrial size was mildly dilated. Right Atrium: Right atrial size was mildly dilated Pericardium: There is no evidence of pericardial effusion. Mitral Valve: The mitral valve is abnormal. Mild to moderate mitral annular calcification. Mild mitral valve regurgitation, with posteriorly-directed jet. Tricuspid Valve: The tricuspid valve is grossly normal. Tricuspid valve regurgitation is trivial. Aortic Valve: The aortic valve is tricuspid. Aortic valve regurgitation is trivial. Mild aortic stenosis is present. Aortic valve mean gradient measures 39.0 mmHg. Aortic valve peak gradient measures 68.6 mmHg. Aortic valve area, by VTI measures 1.02 cm. Pulmonic Valve: The pulmonic valve was grossly normal. Pulmonic valve regurgitation is not visualized. Pulmonic regurgitation is not visualized. Aorta: The aortic root and ascending aorta are structurally normal, with no evidence of dilitation. Venous: The inferior vena cava is dilated in size with less than 50% respiratory variability, suggesting right atrial pressure of 15 mmHg. IAS/Shunts: No atrial level shunt detected by color flow Doppler.  LEFT VENTRICLE PLAX 2D LVIDd:         4.30 cm  Diastology LVIDs:         3.10 cm  LV e' lateral:   5.78 cm/s LV PW:  1.10 cm  LV E/e' lateral: 24.9 LV IVS:        0.90 cm  LV e' medial:    4.73 cm/s LVOT diam:     1.80 cm  LV E/e' medial:  30.4 LV SV:         45 ml LV SV Index:   27.52 LVOT Area:     2.54 cm  RIGHT VENTRICLE RV Basal diam:  2.20 cm RV S prime:     6.83 cm/s TAPSE (M-mode): 2.2 cm LEFT  ATRIUM           Index LA diam:      4.50 cm 2.76 cm/m LA Vol (A4C): 56.5 ml 34.60 ml/m  AORTIC VALVE AV Area (Vmax):    0.96 cm AV Area (Vmean):   0.90 cm AV Area (VTI):     1.02 cm AV Vmax:           414.00 cm/s AV Vmean:          288.000 cm/s AV VTI:            0.981 m AV Peak Grad:      68.6 mmHg AV Mean Grad:      39.0 mmHg LVOT Vmax:         155.50 cm/s LVOT Vmean:        102.050 cm/s LVOT VTI:          0.393 m LVOT/AV VTI ratio: 0.40  AORTA Ao Root diam: 3.00 cm MITRAL VALVE                         TRICUSPID VALVE MV Area (PHT): 2.91 cm              TR Peak grad:   23.6 mmHg MV PHT:        75.69 msec            TR Vmax:        244.00 cm/s MV Decel Time: 261 msec MV E velocity: 144.00 cm/s 103 cm/s  SHUNTS MV A velocity: 66.50 cm/s  70.3 cm/s Systemic VTI:  0.39 m MV E/A ratio:  2.17        1.5       Systemic Diam: 1.80 cm  Lyman Bishop MD Electronically signed by Lyman Bishop MD Signature Date/Time: 05/11/2019/2:49:42 PM    Final         Scheduled Meds: . aspirin EC  81 mg Oral Daily  . Chlorhexidine Gluconate Cloth  6 each Topical Daily  . mouth rinse  15 mL Mouth Rinse BID   Continuous Infusions: . ceFEPime (MAXIPIME) IV Stopped (05/12/19 1535)  . heparin 850 Units/hr (05/13/19 0842)  . lactated ringers 50 mL/hr at 05/13/19 0842     LOS: 3 days    Time spent: 35 mins.More than 50% of that time was spent in counseling and/or coordination of care.      Shelly Coss, MD [facility] Pager [phone]  If 7PM-7AM, please contact night-coverage [email or web address] Password [email or web address] 05/13/2019, 9:22 AM

## 2019-05-13 NOTE — Progress Notes (Signed)
NAME:  [name], MRN:  [id number], DOB:  Jan 27, 1928, LOS: 3 ADMISSION DATE:  05/10/2019, CONSULTATION DATE:  05/10/2019 REFERRING MD:  Lynelle Doctor, CHIEF COMPLAINT:  SOB  Brief History   85 y/o F who presented 12/27 with reports of pre-syncopal symptoms and hypotension.  Diagnosed with UTI two weeks prior to admit and was dizzy at that time. Treated with Macrobid.  Urine culture grew klebsiella which was resistant to Macrobid and she was changed to Bactrim DS on 12/22.  She took the last dose of Bactrim on 12/27 but felt dizzy and was afraid she would fall at home (lives alone).  She has been on lasix at home for LE swelling. Found to be hypotensive on admit, treated with 2L LR with improvement in BP.  ABG 7.26 / 50 / 65 / 22.  She was noted to have an AKI (baseline 1.23 > 1.85), elevated troponin (1644 > 1827) and elevated lactate concerning for sepsis.  CT chest, abd, pelvis completed which showed interstitial pulmonary edema, trace effusions, possible old T3 fracture but otherwise unremarkable.  UA negative.   Past Medical History  CKD Sick sinus syndrome status post pacemaker placement UTI - recently treated with bactrim as outpatient.   Significant Hospital Events   12/27 Admit   Consults:   PCCM cardiology  Procedures:    Significant Diagnostic Tests:  CT Chest/ABD/Pelvis 12/27 >> interstitial pulmonary edema, trace bilateral effusions, mild anasarca / body wall edema, renal atrophy, emphysema, possible old T3 fracture  Micro Data:  COVID PCR 12/27 >> negative  Flu A/B 12/27 >> negative  BCx2 12/27 >> negative UC 12/27 >> negative  Antimicrobials:  Vancomycin x1 12/27 Cefepime 12/27 >> total 5 days.   Interim history/subjective:  Afebrile overnight. Hemodynamically stable. Phenylephrine not started by cardiology. Re-consulted by [facility] given plans to trial phenylephrine by cardiology.   Objective   Blood pressure (!) 117/46, pulse 70, temperature (!) 97.5 F (36.4 C),  temperature source Oral, resp. rate (!) 24, height 5\' 4"  (1.626 m), weight 63 kg, SpO2 97 %.        Intake/Output Summary (Last 24 hours) at 05/13/2019 1046 Last data filed at 05/13/2019 0842 Gross per 24 hour  Intake 1645.24 ml  Output 390 ml  Net 1255.24 ml   Filed Weights   05/11/19 0314 05/12/19 0354 05/13/19 0500  Weight: 59.3 kg 59.3 kg 63 kg    Examination: General: pleasant thin elderly female lying in bed in NAD  HEENT: MM pink/dry, [place] O2, no jvd Neuro: AAOx4, speech clear, MAE CV: s1s2 RRR, 2-3/6 SEM noted PULM:  Tachypnea but non-labored, lungs bilaterally clear GI: soft, bsx4 active  Extremities: warm/dry, no edema  Skin: no rashes or lesions  Resolved Hospital Problem list   Hypotension  Assessment & Plan:   LVOT obstruction Discussed with cardiology Etiology of troponin elevation ischemic vs takotsubo's Plan for phenylephrine trial with repeat echo to see if improvement.   Troponin Elevation  -appreciate Cardiology evaluation, suspicion for takotsubo CM.   -continue heparin gtt, medical management with statin, ASA. Unable to tolerate BB and Ace-I due to hypotension and renal failure.  -tele monitoring   Acute Kidney Injury on CKD stage 3 Admission creatinine 1.85 with baseline 1.2-1.3.  Likely multifactorial in setting of UTI, abx use, poor PO intake.  -Trend BMP / urinary output -Replace electrolytes as indicated -Avoid nephrotoxic agents, ensure adequate renal perfusion -I/O cath this am to assess for urine - plateauing, good uop, continue to monitor.  -  stop IVF  Emphysema / COPD Without acute exacerbation -continue duoneb Q6 with PRN albuterol   Shortness of Breath -wean O2 for sats >90% - likely secondary to mild intersititial edema and trace bilateral pleural effusions -push pulmonary hygiene - IS, mobilize  - down to [facility]. Actually was weaned to room air but felt sob so was placed back on [facility]. Dyspnea may be secondary to LVOT obstruction   - was started empirically on heparin gtt by admitting physician with concern for PE - no evidence of right heart strain. Wells score is 0. Will discontinue.   Klebsiella UTI - severe sepsis resolved - treated with bactrim as outpatient. -given concern for inadequately treated UTI, on empiric cefepime, would complete total 5 day course for complicated UTI.   Acute Encephalopathy ICU delirium - likely worsened by  [facility] Macular Degeneration - non-pharmacologic delirium precautions, OOB.   The patient is critically ill with multiple organ systems failure and requires high complexity decision making for assessment and support, frequent evaluation and titration of therapies, application of advanced monitoring technologies and extensive interpretation of multiple databases.   Critical Care Time devoted to patient care services described in this note is 31 minutes. This time reflects time of care of this [place] . This critical care time does not reflect separately billable procedures or procedure time, teaching time or supervisory time of PA/NP/Med student/Med Resident etc but could involve care discussion time.  [facility] 05/13/2019 10:46 AM  Pager: [phone] After hours pager: [phone]   Best practice:  Diet: heart healthy diet Pain/Anxiety/Delirium protocol (if indicated): N/A VAP protocol (if indicated): N/A DVT prophylaxis: Heparin IV GI prophylaxis: N/A Glucose control: N/A Mobility: As tolerated Code Status: DNR/DNI after discussion with patient Family Communication: per primary Disposition: on step down  Labs/Imaging  CT Chest 12/27 personally reviewed - trace bilateral basilar interstitial edema and trace bibasilar pleural effusions.   CBC: Recent Labs  Lab 05/10/19 1707 05/11/19 0453 05/12/19 0226 05/13/19 0159  WBC 9.4 12.8* 15.2* 15.4*  NEUTROABS 6.6  --   --   --   HGB 12.5 11.8* 10.3* 9.5*    HCT 39.8 38.0 32.0* 30.7*  MCV 100.0 101.6* 100.0 100.0  PLT 201 197 136* 134*    Basic Metabolic Panel: Recent Labs  Lab 05/10/19 1707 05/11/19 0453 05/12/19 1202 05/13/19 0159  NA 140 140 138 139  K 4.3 4.8 4.1 4.2  CL 105 106 106 108  CO2 26 25 21* 22  GLUCOSE 105* 134* 122* 100*  BUN 25* 28* 49* 49*  CREATININE 1.85* 2.03* 2.25* 1.97*  CALCIUM 9.2 9.1 9.1 9.1  MG  --  1.8  --   --   PHOS  --  3.5  --   --    GFR: Estimated Creatinine Clearance: 16.7 mL/min (A) (by C-G formula based on SCr of 1.97 mg/dL (H)). Recent Labs  Lab 05/10/19 1707 05/10/19 1708 05/10/19 1901 05/10/19 2252 05/11/19 0453 05/12/19 0226 05/13/19 0159  WBC 9.4  --   --   --  12.8* 15.2* 15.4*  LATICACIDVEN  --  1.8 3.1* 1.7  --   --   --     Liver Function Tests: Recent Labs  Lab 05/10/19 1707  AST 28  ALT 9  ALKPHOS 42  BILITOT 0.8  PROT 6.2*  ALBUMIN 3.6   No results for input(s): LIPASE, AMYLASE in the last 168 hours. No results for input(s): AMMONIA  in the last 168 hours.  ABG    Component Value Date/Time   PHART 7.267 (L) 05/10/2019 1940   PCO2ART 50.4 (H) 05/10/2019 1940   PO2ART 65.0 (L) 05/10/2019 1940   HCO3 22.3 05/10/2019 1940   ACIDBASEDEF 4.7 (H) 05/10/2019 1940   O2SAT 89.5 05/10/2019 1940     Coagulation Profile: Recent Labs  Lab 05/10/19 1707  INR 1.1    Cardiac Enzymes: No results for input(s): CKTOTAL, CKMB, CKMBINDEX, TROPONINI in the last 168 hours.  HbA1C: Hgb A1c MFr Bld  Date/Time Value Ref Range Status  05/11/2019 04:59 AM 5.3 4.8 - 5.6 % Final    Comment:    (NOTE) Pre diabetes:          5.7%-6.4% Diabetes:              >6.4% Glycemic control for   <7.0% adults with diabetes     CBG: Recent Labs  Lab 05/11/19 1339  GLUCAP 138*

## 2019-05-13 NOTE — Consult Note (Signed)
Palliative care consult note  Reason for consult: Goals of care and end-of-life assistance in light of end-stage cardiac disease  Palliative care consult received.  Chart reviewed including personal review of pertinent labs and imaging.  Briefly, [name] is a 85 year old female with past medical history of pacemaker for bradycardia who presented with issues of dizziness.  Work-up revealed concern for sepsis, AKI and elevated troponin and NSTEMI with possible Takotsubo cardiomyopathy.  Cardiology discussed with patient's daughter today and her daughter [name]) reports that [name] has been saying that she is ready to pass and wants to be at home.  Based upon this, her daughter would like to work to transition to comfort with a goal of transitioning home with the support of hospice.  Palliative care is consulted for care coordination and symptom management.  I saw and examined [name] today.  She is a frail-appearing elderly female who reports being short of breath.  She is confused to her situation and goes back and forth between thinking she is in the hospital in her own home.  Her daughter, [name], is at the bedside.  [name] and I discussed [name]'s course over the past several months with continued decline in her functional status.  [name] reports that following the death of her father (patient's ex-husband) that she has "given up" in some ways and states that she is ready to "pass on."  [name] is a former Insurance underwriter (now works in education) who understands her mother's condition and options for care very well.  She states that her mother, if she were to truly understand her situation, would want to focus on her comfort, dignity, being at home, and spending time with family.  My recommendation after discussing with [name] was to work to transition home with the support of hospice.  -DNR/DNI.  Goal moving forward is full comfort care. -Assuming she survives the night, will to work to  transition her mother home with the support of hospice.  She noted wanting to work with [facility] (formerly hospice and palliative care [place]) for home hospice services.  I did call and briefly touch base with their liaison to let them know that we may be looking to transition her home tomorrow if she appears stable enough to do so. [name] and I reviewed her mother's medications and orders together.  With focus on comfort moving forward, I discontinued interventions that were not in line with this goal.  I also made the adjustments to medications for comfort. - Dilaudid for pain or shortness of breath (morphine allergy) -Ativan for anxiety  -Haldol for agitation  -Robinul if needed for excess secretions.  On discharge, would recommend scripts for: - Oxycodone Concentrate 10mg /0.63ml: 5mg  (0.76ml) sublingual every 1 hour as needed for pain or shortness of breath: Disp 16ml (has morphine allergy) - Lorazepam 2mg /ml concentrated solution: 1mg  (0.3ml) sublingual every 4 hours as needed for anxiety: Disp 56ml - Haldol 2mg /ml solution: 0.5mg  (0.6ml) sublingual every 4 hours as needed for agitation or nausea: Disp 84ml   Time in: 1540 Time out: 1640  Total time: 60 minutes  Greater than 50%  of this time was spent counseling and coordinating care related to the above assessment and plan.  4m, MD [facility] Palliative Medicine Team [phone]   Addendum: I checked on [name] following transition to general floor and administration of dose of Dilaudid.  She reports her breathing feels much better.  She also requested restarting her melatonin.  I called and  discussed with pharmacy and appreciate their assistance regarding this.

## 2019-05-13 NOTE — Progress Notes (Signed)
ANTICOAGULATION CONSULT NOTE  Pharmacy Consult for IV heparin Indication: NSTEMI  Allergies  Allergen Reactions  . Ciprofloxacin Hives and Other (See Comments)    Got hives after 10 days of both cipro and flagyl, not sure which caused the hives  . Flagyl [Metronidazole] Hives and Other (See Comments)    Got hives after 10 days of both cipro and flagyl, not sure which caused the hives  . Morphine And Related Nausea And Vomiting    Patient Measurements: Height: 5\' 4"  (162.6 cm) Weight: 138 lb 14.2 oz (63 kg) IBW/kg (Calculated) : 54.7 Heparin Dosing Weight: n/a. TBW = 59 kg  Vital Signs: Temp: 98.8 F (37.1 C) (12/30 0000) Temp Source: Oral (12/30 0000) BP: 112/45 (12/30 0400) Pulse Rate: 70 (12/30 0400)  Labs: Recent Labs    05/10/19 1707 05/10/19 1707 05/10/19 1901 05/11/19 0009 05/11/19 0453 05/12/19 0226 05/12/19 1202 05/13/19 0159  HGB 12.5  --   --   --  11.8* 10.3*  --  9.5*  HCT 39.8  --   --   --  38.0 32.0*  --  30.7*  PLT 201  --   --   --  197 136*  --  134*  APTT 23*  --   --   --   --   --   --   --   LABPROT 13.7  --   --   --   --   --   --   --   INR 1.1  --   --   --   --   --   --   --   HEPARINUNFRC  --    < >  --   --  1.00* 0.24* 0.34 0.13*  CREATININE 1.85*  --   --   --  2.03*  --  2.25* 1.97*  TROPONINIHS 1,644*  --  1,827* 1,630* 2,369*  --   --   --    < > = values in this interval not displayed.    Estimated Creatinine Clearance: 16.7 mL/min (A) (by C-G formula based on SCr of 1.97 mg/dL (H)).   Medical History: Past Medical History:  Diagnosis Date  . Acute kidney injury superimposed on chronic kidney disease (HCC) 05/11/2019  . Chest wall pain 2003   atypical, now resolved  . CKD (chronic kidney disease), stage III    Cr 1.27, GFR 40 (07/2011)  . Diverticulitis    with DT flare in 6/09  . Gout    R hand  . Hypertension   . Hypertriglyceridemia   . Macular degeneration    Dr. 8/09  . Pacemaker 1994   MDT dual lead PPM,  sees Dr. Sharlot Gowda annually. Last gen change 10/2017  . Sick sinus syndrome (HCC)    with bradycardia, has [employer] pacemaker  . Tobacco use disorder    quit 01/2013--restarted in 2017  . Urge incontinence     Medications:  Not on anticoagulants PTA  Assessment: 64 yoF with NSTEMI and possible Takotsubo cardiomyopathy  Pharmacy is consulted to assist with management of IV heparin dosing.  Baseline INR, aPTT, CBC: WNL  Prior anticoagulation: none, ASA only PTA  SCr elevated on admission  Significant events: -12/28: ECHO EF 40-45%, possible Takotsubo CM  Today, 05/13/19  Heparin level down to 0.13 (subtherapeutic) on 600 units/hr  No infusion interruptions or bleeding noted, per RN report  Hgb down slightly from yesterday; remains > 9 g/dL  Pltc down slightly from yesterday; remains >  100K  Goal of Therapy: Heparin level 0.3-0.7 units/ml Monitor platelets by anticoagulation protocol: Yes  Plan:  Increase heparin to 850 units/hr  Recheck heparin level in 8 hrs (2pm)  Daily heparin level and CBC while on heparin  Monitor for any reports of bleeding  Clayburn Pert, PharmD, BCPS 05/13/2019  6:01 AM

## 2019-05-14 MED ORDER — OXYCODONE HCL 20 MG/ML PO CONC
5.0000 mg | ORAL | Status: DC | PRN
  Administered 2019-05-14: 5 mg via ORAL
  Filled 2019-05-14: qty 1

## 2019-05-14 MED ORDER — OXYCODONE HCL 20 MG/ML PO CONC: 10.0000 mg | ORAL | 0 refills | Status: AC | PRN

## 2019-05-14 MED ORDER — HALOPERIDOL LACTATE 2 MG/ML PO CONC: 0.6000 mg | ORAL | 0 refills | Status: AC | PRN

## 2019-05-14 MED ORDER — ATROPINE SULFATE 1 % OP SOLN
3.0000 [drp] | OPHTHALMIC | Status: DC | PRN
  Filled 2019-05-14: qty 2

## 2019-05-14 MED ORDER — LORAZEPAM 2 MG/ML PO CONC: 1.0000 mg | ORAL | 0 refills | Status: AC | PRN

## 2019-05-14 NOTE — Discharge Summary (Signed)
Physician Discharge Summary  [name]:[id number] DOB: 1928/05/12 DOA: 05/10/2019  PCP: Joselyn Arrow, MD  Admit date: 05/10/2019 Discharge date: 05/14/2019  Admitted From: Home Disposition:  Home  Discharge Condition:Stable CODE STATUS:FULL Diet recommendation:  Regular   Brief/Interim Summary:  Patient is a 85 year old female who presented from her home to the emergency department with complaints of presyncopal symptoms.  She was found to be hypotensive on presentation.  She was diagnosed with urinary tract infection about 2 weeks ago and was treated with antibiotics.  She was feeling dizziness at home.  She was found to have AKI on presentation, elevated troponin, elevated lactate concerning for sepsis.  CT chest/abdomen/pelvis showed pulmonary edema, trace effusions.  She was admitted under PCCM service. Patient was handed over to hospitalist team on 05/13/2019. Cardiology also following. She was being  managed for NSTEMI, possible Takotsubo cardiomyopathy.  Started  on heparin drip and phenylephrine.  Patient and family opted for comfort care and put their interest to be discharged home with hospice.  She is being discharged to home with hospice today.  Following problems were addressed during her hospitalization:   Suspected severe sepsis: She was recently treated for Klebsiella UTI as an outpatient with Macrobid, Bactrim.  Presented with lactic acidosis.  She was found to be dehydrated on presentation and was given IV fluids.  She has been started on cefepime here, plan was to complete 5 days course for complicated UTI. Since she has been transitioned to comfort care, antibiotics and fluids discontinued.  Takotsubo cardiomyopathy/LVOT obstruction: Presented with elevated troponin.  Denies any chest pain.  Cardiology following.  Echocardiogram showed ejection fraction of 40 to 45%, moderately decreased left ventricular function, severe hypokinesis suggesting Takotsubo  cardiomyopathy, grade 2 diastolic dysfunction., left ventricular outlet obstruction. She was briefly started on  phenylephrine by cardiology.  Troponin elevation/NSTEMI: Possible ischemic etiology versus Takotsubo.  She was on  heparin drip.    Denies any chest pain.  H/O Sinus node dysfunction: Has pacemaker.Follows with Dr Royann Shivers  Acute hypoxic/hypercarbic respiratory failure: Currently requiring oxygen supplementation for maintenance of saturation.  CT showed interstitial edema, bilateral pleural effusions.   AKI on CKD stage III: Baseline creatinine of 1.2-1.3.  On presentation her creatinine was 1.5.  Likely multifactorial in the setting of UTI, antibiotic use, poor oral intake.   History of emphysema/COPD: Currently not in exacerbation.  Decreased bilateral air entry on auscultation.   Acute encephalopathy/delirium: Likely have improved.  Currently she is alert, awake and oriented x4.  Debility/deconditioning/advanced age, poor prognosis: Extensive discussion was held with the family and they wanted comfort care/hospice approach.  She is being discharged today to home with hospice  Discharge Diagnoses:  Principal Problem:   Severe sepsis (HCC) Active Problems:   Chronic diastolic heart failure (HCC)   Elevated troponin   Acute kidney injury superimposed on chronic kidney disease [facility])    Discharge Instructions  Discharge Instructions    Diet - low sodium heart healthy   Complete by: As directed    Discharge instructions   Complete by: As directed    1)Take prescribed medications as instructed. 2)Follow up with Hospice at home.   Increase activity slowly   Complete by: As directed      Allergies as of 05/14/2019      Reactions   Ciprofloxacin Hives, Other (See Comments)   Got hives after 10 days of both cipro and flagyl, not sure which caused the hives   Flagyl [metronidazole] Hives, Other (See Comments)  Got hives after 10 days of both cipro and flagyl,  not sure which caused the hives   Morphine And Related Nausea And Vomiting      Medication List    STOP taking these medications   sulfamethoxazole-trimethoprim 800-160 MG tablet Commonly known as: BACTRIM DS     TAKE these medications   acetaminophen 500 MG tablet Commonly known as: TYLENOL Take 1,000 mg by mouth every 6 (six) hours as needed.   aspirin EC 81 MG tablet Take 81 mg by mouth daily.   calcium carbonate 600 MG Tabs tablet Commonly known as: OS-CAL Take 600 mg by mouth daily. With Vitamin D   Fish Oil 1000 MG Caps Take 2,000 mg by mouth daily.   furosemide 40 MG tablet Commonly known as: LASIX TAKE 2 TABLETS ( 80 MG ) DAILY *NEEDS OFFICE VISIT FOR FURTHER REFILLS* What changed: See the new instructions.   haloperidol 2 MG/ML solution Commonly known as: HALDOL Place 0.3 mLs (0.6 mg total) under the tongue every 4 (four) hours as needed for agitation (or delirium).   loratadine 10 MG tablet Commonly known as: CLARITIN Take 10 mg by mouth daily.   LORazepam 2 MG/ML concentrated solution Commonly known as: ATIVAN Place 0.5 mLs (1 mg total) under the tongue every 4 (four) hours as needed for anxiety.   Melatonin 10 MG Tabs Take 10 mg by mouth at bedtime.   metoprolol tartrate 25 MG tablet Commonly known as: LOPRESSOR TAKE 1 TABLET BY MOUTH TWICE A DAY What changed:   how much to take  how to take this  when to take this  additional instructions   OCUVITE PRESERVISION PO Take 1 tablet by mouth daily.   oxyCODONE 20 MG/ML concentrated solution Commonly known as: ROXICODONE INTENSOL Take 0.5 mLs (10 mg total) by mouth every hour as needed for moderate pain or severe pain (shortness of breath, or respiratory rate >25).   potassium chloride 10 MEQ tablet Commonly known as: KLOR-CON Take 20 mEq by mouth daily as needed.      Follow-up Information    Joselyn Arrow, MD. Schedule an appointment as soon as possible for a visit in 1 week(s).    Specialty: Family Medicine Contact information: [address] [place] [address] [phone]          Allergies  Allergen Reactions  . Ciprofloxacin Hives and Other (See Comments)    Got hives after 10 days of both cipro and flagyl, not sure which caused the hives  . Flagyl [Metronidazole] Hives and Other (See Comments)    Got hives after 10 days of both cipro and flagyl, not sure which caused the hives  . Morphine And Related Nausea And Vomiting    Consultations:  PCCM, cardiology ,palliative care.   Procedures/Studies: CT ABDOMEN PELVIS WO CONTRAST  Result Date: 05/10/2019 CLINICAL DATA:  Shortness of breath and nonlocalized abdominal pain EXAM: CT CHEST, ABDOMEN AND PELVIS WITHOUT CONTRAST TECHNIQUE: Multidetector CT imaging of the chest, abdomen and pelvis was performed following the standard protocol without IV contrast. COMPARISON:  Chest radiograph 05/10/2019, CT abdomen pelvis 12/14/2014 FINDINGS: CT CHEST FINDINGS Cardiovascular: Right chest wall pacer pack with leads directed towards the right atrium and ventricle. Normal cardiac size. Extensive coronary artery calcium. Trace pericardial fluid, likely at the upper limits of normal. Extensive atherosclerotic calcification of the thoracic aorta. Aorta is normal caliber. Normal 3 vessel branching of the aortic arch. Extensive calcification of the proximal great vessels. Central pulmonary arteries are the upper  limits of normal for size. Luminal evaluation precluded in the absence of contrast. Mediastinum/Nodes: Few scattered low-attenuation subcentimeter mediastinal nodes, nonspecific and potentially reactive or edematous. No pathologically enlarged mediastinal or axillary adenopathy. Hilar lymph nodes difficult to assess in absence of contrast media. Thyroid gland and thoracic inlet is unremarkable. The no acute abnormality of trachea or esophagus. Posterior bowing of the trachea likely related to exhalation during  imaging. Lungs/Pleura: There is diffuse interlobular septal thickening as well as peribronchovascular thickening and trace bilateral effusions with adjacent passive and dependent atelectasis. Suspect some underlying emphysematous change though difficult to fully assess underlying parenchyma given extensive respiratory motion. No consolidative opacity. No pneumothorax. Biapical pleuroparenchymal scarring is present. Musculoskeletal: Multilevel degenerative changes are present in the imaged portions of the spine. No acute osseous abnormality or suspicious osseous lesion. Assessment for rib fractures is limited given motion artifact with step artifact on multiplanar reconstruction. No gross rib deformities. CT ABDOMEN PELVIS FINDINGS Hepatobiliary: No focal liver abnormality is seen. Patient is post cholecystectomy. Slight prominence of the biliary tree likely related to reservoir effect. No calcified intraductal gallstones. Pancreas: Extensive fatty replacement of the pancreas. No peripancreatic inflammation or ductal dilatation. Spleen: Normal in size without focal abnormality. Adrenals/Urinary Tract: Normal adrenal glands. Atrophic kidneys. No visible concerning renal lesions. No urolithiasis or hydronephrosis. Urinary bladder is largely decompressed at the time of exam and therefore poorly evaluated by CT imaging. Stomach/Bowel: Distal esophagus, stomach and duodenal sweep are unremarkable. No small bowel wall thickening or dilatation. No evidence of obstruction. The appendix is surgically absent. No colonic dilatation or wall thickening. Scattered colonic diverticula without focal pericolonic inflammation to suggest diverticulitis. Vascular/Lymphatic: Atherosclerotic plaque within the normal caliber aorta. No suspicious or enlarged lymph nodes in the included lymphatic chains. Reproductive: Small amount of air within the vaginal canal. Uterus is surgically absent. No concerning adnexal lesions. Other:  Circumferential body wall edema as well as mid mesenteric increased attenuation. No free fluid. No free air. No bowel containing hernias. Musculoskeletal: Diffuse muscular atrophy, likely senescent or related to deconditioning. Split deformity of the L3 vertebral body 60% central height loss, new since comparison August 2016. Age indeterminate on this exam though favor remote. IMPRESSION: 1. Interstitial pulmonary edema with trace bilateral effusions. 2. Split deformity of the L3 vertebral body, AOSpine A2 ,60% central height loss, new since comparison August 2016. Age indeterminate though favor remote. Recommend correlation with point tenderness. 3. Features of mild anasarca with circumferential body wall edema as well as likely mild been mesenteric edema 4. Fatty replacement of the pancreas. 5. Renal atrophy. 6. Prior hysterectomy, appendectomy, cholecystectomy. 7.  Aortic Atherosclerosis (ICD10-I70.0). 8.  Emphysema (ICD10-J43.9). 9. Extensive coronary atherosclerosis. Electronically Signed   By: Kreg Shropshire M.D.   On: 05/10/2019 19:14   CT CHEST WO CONTRAST  Result Date: 05/10/2019 CLINICAL DATA:  Shortness of breath and nonlocalized abdominal pain EXAM: CT CHEST, ABDOMEN AND PELVIS WITHOUT CONTRAST TECHNIQUE: Multidetector CT imaging of the chest, abdomen and pelvis was performed following the standard protocol without IV contrast. COMPARISON:  Chest radiograph 05/10/2019, CT abdomen pelvis 12/14/2014 FINDINGS: CT CHEST FINDINGS Cardiovascular: Right chest wall pacer pack with leads directed towards the right atrium and ventricle. Normal cardiac size. Extensive coronary artery calcium. Trace pericardial fluid, likely at the upper limits of normal. Extensive atherosclerotic calcification of the thoracic aorta. Aorta is normal caliber. Normal 3 vessel branching of the aortic arch. Extensive calcification of the proximal great vessels. Central pulmonary arteries are the upper limits of normal  for size.  Luminal evaluation precluded in the absence of contrast. Mediastinum/Nodes: Few scattered low-attenuation subcentimeter mediastinal nodes, nonspecific and potentially reactive or edematous. No pathologically enlarged mediastinal or axillary adenopathy. Hilar lymph nodes difficult to assess in absence of contrast media. Thyroid gland and thoracic inlet is unremarkable. The no acute abnormality of trachea or esophagus. Posterior bowing of the trachea likely related to exhalation during imaging. Lungs/Pleura: There is diffuse interlobular septal thickening as well as peribronchovascular thickening and trace bilateral effusions with adjacent passive and dependent atelectasis. Suspect some underlying emphysematous change though difficult to fully assess underlying parenchyma given extensive respiratory motion. No consolidative opacity. No pneumothorax. Biapical pleuroparenchymal scarring is present. Musculoskeletal: Multilevel degenerative changes are present in the imaged portions of the spine. No acute osseous abnormality or suspicious osseous lesion. Assessment for rib fractures is limited given motion artifact with step artifact on multiplanar reconstruction. No gross rib deformities. CT ABDOMEN PELVIS FINDINGS Hepatobiliary: No focal liver abnormality is seen. Patient is post cholecystectomy. Slight prominence of the biliary tree likely related to reservoir effect. No calcified intraductal gallstones. Pancreas: Extensive fatty replacement of the pancreas. No peripancreatic inflammation or ductal dilatation. Spleen: Normal in size without focal abnormality. Adrenals/Urinary Tract: Normal adrenal glands. Atrophic kidneys. No visible concerning renal lesions. No urolithiasis or hydronephrosis. Urinary bladder is largely decompressed at the time of exam and therefore poorly evaluated by CT imaging. Stomach/Bowel: Distal esophagus, stomach and duodenal sweep are unremarkable. No small bowel wall thickening or dilatation.  No evidence of obstruction. The appendix is surgically absent. No colonic dilatation or wall thickening. Scattered colonic diverticula without focal pericolonic inflammation to suggest diverticulitis. Vascular/Lymphatic: Atherosclerotic plaque within the normal caliber aorta. No suspicious or enlarged lymph nodes in the included lymphatic chains. Reproductive: Small amount of air within the vaginal canal. Uterus is surgically absent. No concerning adnexal lesions. Other: Circumferential body wall edema as well as mid mesenteric increased attenuation. No free fluid. No free air. No bowel containing hernias. Musculoskeletal: Diffuse muscular atrophy, likely senescent or related to deconditioning. Split deformity of the L3 vertebral body 60% central height loss, new since comparison August 2016. Age indeterminate on this exam though favor remote. IMPRESSION: 1. Interstitial pulmonary edema with trace bilateral effusions. 2. Split deformity of the L3 vertebral body, AOSpine A2 ,60% central height loss, new since comparison August 2016. Age indeterminate though favor remote. Recommend correlation with point tenderness. 3. Features of mild anasarca with circumferential body wall edema as well as likely mild been mesenteric edema 4. Fatty replacement of the pancreas. 5. Renal atrophy. 6. Prior hysterectomy, appendectomy, cholecystectomy. 7.  Aortic Atherosclerosis (ICD10-I70.0). 8.  Emphysema (ICD10-J43.9). 9. Extensive coronary atherosclerosis. Electronically Signed   By: Kreg Shropshire M.D.   On: 05/10/2019 19:14   DG Chest Port 1 View  Result Date: 05/10/2019 CLINICAL DATA:  Shortness of breath, generalized weakness, just finished antibiotics for UTI yesterday, hypertension, smoker EXAM: PORTABLE CHEST 1 VIEW COMPARISON:  Portable exam 1717 hours compared to 06/29/2015 FINDINGS: RIGHT subclavian transvenous pacemaker with leads projecting over RIGHT atrium and RIGHT ventricle. Normal heart size, mediastinal contours,  and pulmonary vascularity. Atherosclerotic calcification aorta. Emphysematous changes with minimal central peribronchial thickening consistent with COPD. Biapical scarring greater on RIGHT. No definite pulmonary infiltrate, pleural effusion or pneumothorax. Bones demineralized. IMPRESSION: COPD changes without acute abnormalities. Aortic Atherosclerosis (ICD10-I70.0). Electronically Signed   By: Ulyses Southward M.D.   On: 05/10/2019 17:51   ECHOCARDIOGRAM COMPLETE  Result Date: 05/11/2019   ECHOCARDIOGRAM REPORT   Patient  Name:   [name] Date of Exam: 05/11/2019 Medical Rec #:  [id number]       Height:       64.0 in Accession #:    [id number]      Weight:       130.7 lb Date of Birth:  1927-07-05        BSA:          1.63 m Patient Age:    89 years        BP:           87/50 mmHg Patient Gender: F               HR:           70 bpm. Exam Location:  Inpatient Procedure: 2D Echo, Cardiac Doppler and Color Doppler Indications:    Acute MI  History:        Patient has prior history of Echocardiogram examinations, most                 recent 06/25/2018. CHF, Pacemaker, Arrythmias:SSS and Atrial                 Fibrillation; Signs/Symptoms:Chest Pain, Shortness of Breath and                 Hypotension. CKD, elevated Troponin.  Sonographer:    Lavenia Atlas Referring Phys: [id number] [facility] ALI IMPRESSIONS  1. Left ventricular ejection fraction, by visual estimation, is 40 to 45%. The left ventricle has moderately decreased function. There is mildly increased left ventricular hypertrophy.  2. The left ventricle demonstrates regional wall motion abnormalities.  3. Severe hypokinesis of the left ventricular, mid-apical anteroseptal wall, anterior wall, apical segment and inferoapical segment. Findings could be consistent with LAD territory MI vs Takatsubo cardiomyopathy.  4. Left ventricular diastolic parameters are consistent with Grade II diastolic dysfunction (pseudonormalization).  5. Mildly elevated pulmonary  artery systolic pressure.  6. Global right ventricle has mildly reduced systolic function.The right ventricular size is normal. No increase in right ventricular wall thickness.  7. The aortic valve is tricuspid. Aortic valve regurgitation is trivial. Mild aortic valve stenosis. There is a dynamic LVOT gradient that is mostly responsivly for the increased transvalvular aortic gradient.  8. Aortic valve peak gradient measures 68.6 mmHg.  9. Aortic valve mean gradient measures 39.0 mmHg. 10. Right atrial size was mildly dilated. 11. Elevated left atrial and left ventricular end-diastolic pressures. 12. Left atrial size was mildly dilated. 13. Mild to moderate mitral annular calcification. 14. The mitral valve is abnormal. Mild mitral valve regurgitation. 15. The tricuspid valve is grossly normal. 16. The pulmonic valve was grossly normal. Pulmonic valve regurgitation is not visualized. 17. The inferior vena cava is dilated in size with <50% respiratory variability, suggesting right atrial pressure of 15 mmHg. 18. The tricuspid regurgitant velocity is 2.43 m/s, and with an assumed right atrial pressure of 15 mmHg, the estimated right ventricular systolic pressure is mildly elevated at 38.6 mmHg. FINDINGS  Left Ventricle: Left ventricular ejection fraction, by visual estimation, is 40 to 45%. The left ventricle has moderately decreased function. Severe hypokinesis of the left ventricular, mid-apical anteroseptal wall, anterior wall, apical segment and inferoapical segment. The left ventricle demonstrates regional wall motion abnormalities. There is mildly increased left ventricular hypertrophy. Left ventricular diastolic parameters are consistent with Grade II diastolic dysfunction (pseudonormalization). Elevated left atrial and left ventricular end-diastolic pressures. Right Ventricle: The right ventricular size is normal. No increase in  right ventricular wall thickness. Global RV systolic function is has mildly reduced  systolic function. The tricuspid regurgitant velocity is 2.43 m/s, and with an assumed right atrial pressure of 15 mmHg, the estimated right ventricular systolic pressure is mildly elevated at 38.6 mmHg. Left Atrium: Left atrial size was mildly dilated. Right Atrium: Right atrial size was mildly dilated Pericardium: There is no evidence of pericardial effusion. Mitral Valve: The mitral valve is abnormal. Mild to moderate mitral annular calcification. Mild mitral valve regurgitation, with posteriorly-directed jet. Tricuspid Valve: The tricuspid valve is grossly normal. Tricuspid valve regurgitation is trivial. Aortic Valve: The aortic valve is tricuspid. Aortic valve regurgitation is trivial. Mild aortic stenosis is present. Aortic valve mean gradient measures 39.0 mmHg. Aortic valve peak gradient measures 68.6 mmHg. Aortic valve area, by VTI measures 1.02 cm. Pulmonic Valve: The pulmonic valve was grossly normal. Pulmonic valve regurgitation is not visualized. Pulmonic regurgitation is not visualized. Aorta: The aortic root and ascending aorta are structurally normal, with no evidence of dilitation. Venous: The inferior vena cava is dilated in size with less than 50% respiratory variability, suggesting right atrial pressure of 15 mmHg. IAS/Shunts: No atrial level shunt detected by color flow Doppler.  LEFT VENTRICLE PLAX 2D LVIDd:         4.30 cm  Diastology LVIDs:         3.10 cm  LV e' lateral:   5.78 cm/s LV PW:         1.10 cm  LV E/e' lateral: 24.9 LV IVS:        0.90 cm  LV e' medial:    4.73 cm/s LVOT diam:     1.80 cm  LV E/e' medial:  30.4 LV SV:         45 ml LV SV Index:   27.52 LVOT Area:     2.54 cm  RIGHT VENTRICLE RV Basal diam:  2.20 cm RV S prime:     6.83 cm/s TAPSE (M-mode): 2.2 cm LEFT ATRIUM           Index LA diam:      4.50 cm 2.76 cm/m LA Vol (A4C): 56.5 ml 34.60 ml/m  AORTIC VALVE AV Area (Vmax):    0.96 cm AV Area (Vmean):   0.90 cm AV Area (VTI):     1.02 cm AV Vmax:            414.00 cm/s AV Vmean:          288.000 cm/s AV VTI:            0.981 m AV Peak Grad:      68.6 mmHg AV Mean Grad:      39.0 mmHg LVOT Vmax:         155.50 cm/s LVOT Vmean:        102.050 cm/s LVOT VTI:          0.393 m LVOT/AV VTI ratio: 0.40  AORTA Ao Root diam: 3.00 cm MITRAL VALVE                         TRICUSPID VALVE MV Area (PHT): 2.91 cm              TR Peak grad:   23.6 mmHg MV PHT:        75.69 msec            TR Vmax:        244.00 cm/s MV Decel Time: 261  msec MV E velocity: 144.00 cm/s 103 cm/s  SHUNTS MV A velocity: 66.50 cm/s  70.3 cm/s Systemic VTI:  0.39 m MV E/A ratio:  2.17        1.5       Systemic Diam: 1.80 cm  Zoila ShutterKenneth Hilty MD Electronically signed by Zoila ShutterKenneth Hilty MD Signature Date/Time: 05/11/2019/2:49:42 PM    Final        Subjective: Patient seen and examined the bedside this morning.  Currently hemodynamically stable.  Stable for discharge to home with hospice.  Discharge Exam: Vitals:   05/13/19 1600 05/13/19 1756  BP:  131/69  Pulse:  70  Resp:  (!) 26  Temp: 97.8 F (36.6 C) 97.7 F (36.5 C)  SpO2:  92%   Vitals:   05/13/19 1300 05/13/19 1500 05/13/19 1600 05/13/19 1756  BP: (!) 107/59 (!) 139/57  131/69  Pulse: 76 69  70  Resp: (!) 35 (!) 38  (!) 26  Temp:   97.8 F (36.6 C) 97.7 F (36.5 C)  TempSrc:   Oral Oral  SpO2: 96% 94%  92%  Weight:      Height:        General: Pt is alert, awake Cardiovascular: RRR, S1/S2 +, no rubs, no gallops Respiratory: Diminished breath sounds bilaterally Abdominal: Soft, NT, ND, bowel sounds + Extremities: no edema, no cyanosis    The results of significant diagnostics from this hospitalization (including imaging, microbiology, ancillary and laboratory) are listed below for reference.     Microbiology: Recent Results (from the past 240 hour(s))  Culture, blood (Routine x 2)     Status: None (Preliminary result)   Collection Time: 05/10/19  4:58 PM   Specimen: BLOOD  Result Value Ref Range Status    Specimen Description   Final    BLOOD RIGHT HAND Performed at [facility], [address] [place], [place] [address]    Special Requests   Final    BOTTLES DRAWN AEROBIC AND ANAEROBIC Blood Culture adequate volume Performed at [facility], [address] [place], [place] [address]    Culture   Final    NO GROWTH 3 DAYS Performed at [facility], [address] [place], [place] [address]    Report Status PENDING  Incomplete  Culture, blood (Routine x 2)     Status: None (Preliminary result)   Collection Time: 05/10/19  5:08 PM   Specimen: BLOOD LEFT HAND  Result Value Ref Range Status   Specimen Description   Final    BLOOD LEFT HAND Performed at [facility], [address] [place], [place] [address]    Special Requests   Final    BOTTLES DRAWN AEROBIC AND ANAEROBIC Blood Culture adequate volume Performed at [facility], [address] [place], [place] [address]    Culture   Final    NO GROWTH 3 DAYS Performed at [facility], [address] [place], [place] [address]    Report Status PENDING  Incomplete  Urine culture     Status: None   Collection Time: 05/10/19  7:01 PM   Specimen: In/Out Cath Urine  Result Value Ref Range Status   Specimen Description   Final    IN/OUT CATH URINE Performed at [facility], [address] [place], [place] [address]    Special Requests   Final    NONE Performed at [facility], [address]  [place], [place] [address]    Culture   Final    NO GROWTH Performed at [facility], [place] [address] [place], [place] [address]    Report Status 05/11/2019 FINAL  Final  Respiratory Panel by RT PCR (Flu A&B, Covid) - Nasopharyngeal Swab     Status: None   Collection Time: 05/10/19  9:05 PM   Specimen: Nasopharyngeal Swab  Result Value Ref Range Status   SARS Coronavirus 2 by RT PCR NEGATIVE NEGATIVE  Final    Comment: (NOTE) SARS-CoV-2 target nucleic acids are NOT DETECTED. The SARS-CoV-2 RNA is generally detectable in upper respiratoy specimens during the acute phase of infection. The lowest concentration of SARS-CoV-2 viral copies this assay can detect is 131 copies/mL. A negative result does not preclude SARS-Cov-2 infection and should not be used as the sole basis for treatment or other patient management decisions. A negative result may occur with  improper specimen collection/handling, submission of specimen other than nasopharyngeal swab, presence of viral mutation(s) within the areas targeted by this assay, and inadequate number of viral copies (<131 copies/mL). A negative result must be combined with clinical observations, patient history, and epidemiological information. The expected result is Negative. Fact Sheet for Patients:  PinkCheek.be Fact Sheet for Healthcare Providers:  GravelBags.it This test is not yet ap proved or cleared by the [place] FDA and  has been authorized for detection and/or diagnosis of SARS-CoV-2 by FDA under an Emergency Use Authorization (EUA). This EUA will remain  in effect (meaning this test can be used) for the duration of the COVID-19 declaration under Section 564(b)(1) of the Act, 21 U.S.C. section 360bbb-3(b)(1), unless the authorization is terminated or revoked sooner.    Influenza A by PCR NEGATIVE NEGATIVE Final   Influenza B by PCR NEGATIVE NEGATIVE Final    Comment: (NOTE) The Xpert Xpress SARS-CoV-2/FLU/RSV assay is intended as an aid in  the diagnosis of influenza from Nasopharyngeal swab specimens and  should not be used as a sole basis for treatment. Nasal washings and  aspirates are unacceptable for Xpert Xpress SARS-CoV-2/FLU/RSV  testing. Fact Sheet for Patients: PinkCheek.be Fact Sheet for Healthcare  Providers: GravelBags.it This test is not yet approved or cleared by the [place] FDA and  has been authorized for detection and/or diagnosis of SARS-CoV-2 by  FDA under an Emergency Use Authorization (EUA). This EUA will remain  in effect (meaning this test can be used) for the duration of the  Covid-19 declaration under Section 564(b)(1) of the Act, 21  U.S.C. section 360bbb-3(b)(1), unless the authorization is  terminated or revoked. Performed at [facility], [place] [address] [place], [place] [address]   MRSA PCR Screening     Status: None   Collection Time: 05/11/19  3:14 AM   Specimen: Nasal Mucosa; Nasopharyngeal  Result Value Ref Range Status   MRSA by PCR NEGATIVE NEGATIVE Final    Comment:        The GeneXpert MRSA Assay (FDA approved for NASAL specimens only), is one component of a comprehensive MRSA colonization surveillance program. It is not intended to diagnose MRSA infection nor to guide or monitor treatment for MRSA infections. Performed at [facility], [place] [address] [place], [place] [address]      Labs: BNP (last 3 results) Recent Labs    05/10/19 2033  BNP 557.3*   Basic Metabolic Panel: Recent Labs  Lab 05/10/19 1707 05/11/19 0453 05/12/19 1202 05/13/19 0159  NA 140 140 138  139  K 4.3 4.8 4.1 4.2  CL 105 106 106 108  CO2 26 25 21* 22  GLUCOSE 105* 134* 122* 100*  BUN 25* 28* 49* 49*  CREATININE 1.85* 2.03* 2.25* 1.97*  CALCIUM 9.2 9.1 9.1 9.1  MG  --  1.8  --   --   PHOS  --  3.5  --   --    Liver Function Tests: Recent Labs  Lab 05/10/19 1707  AST 28  ALT 9  ALKPHOS 42  BILITOT 0.8  PROT 6.2*  ALBUMIN 3.6   No results for input(s): LIPASE, AMYLASE in the last 168 hours. No results for input(s): AMMONIA in the last 168 hours. CBC: Recent Labs  Lab 05/10/19 1707 05/11/19 0453 05/12/19 0226 05/13/19 0159  WBC 9.4 12.8* 15.2* 15.4*  NEUTROABS 6.6  --    --   --   HGB 12.5 11.8* 10.3* 9.5*  HCT 39.8 38.0 32.0* 30.7*  MCV 100.0 101.6* 100.0 100.0  PLT 201 197 136* 134*   Cardiac Enzymes: No results for input(s): CKTOTAL, CKMB, CKMBINDEX, TROPONINI in the last 168 hours. BNP: Invalid input(s): POCBNP CBG: Recent Labs  Lab 05/11/19 1339  GLUCAP 138*   D-Dimer No results for input(s): DDIMER in the last 72 hours. Hgb A1c No results for input(s): HGBA1C in the last 72 hours. Lipid Profile No results for input(s): CHOL, HDL, LDLCALC, TRIG, CHOLHDL, LDLDIRECT in the last 72 hours. Thyroid function studies No results for input(s): TSH, T4TOTAL, T3FREE, THYROIDAB in the last 72 hours.  Invalid input(s): FREET3 Anemia work up No results for input(s): VITAMINB12, FOLATE, FERRITIN, TIBC, IRON, RETICCTPCT in the last 72 hours. Urinalysis    Component Value Date/Time   COLORURINE YELLOW 05/10/2019 1901   APPEARANCEUR CLEAR 05/10/2019 1901   LABSPEC 1.009 05/10/2019 1901   LABSPEC 1.010 04/29/2019 0907   PHURINE 6.0 05/10/2019 1901   GLUCOSEU NEGATIVE 05/10/2019 1901   HGBUR NEGATIVE 05/10/2019 1901   BILIRUBINUR NEGATIVE 05/10/2019 1901   BILIRUBINUR negative 04/29/2019 0907   BILIRUBINUR neg 01/05/2015 1319   KETONESUR NEGATIVE 05/10/2019 1901   PROTEINUR NEGATIVE 05/10/2019 1901   UROBILINOGEN negative 01/05/2015 1319   UROBILINOGEN 0.2 12/18/2009 0857   NITRITE NEGATIVE 05/10/2019 1901   LEUKOCYTESUR NEGATIVE 05/10/2019 1901   Sepsis Labs Invalid input(s): PROCALCITONIN,  WBC,  LACTICIDVEN Microbiology Recent Results (from the past 240 hour(s))  Culture, blood (Routine x 2)     Status: None (Preliminary result)   Collection Time: 05/10/19  4:58 PM   Specimen: BLOOD  Result Value Ref Range Status   Specimen Description   Final    BLOOD RIGHT HAND Performed at [facility], [address], [place], [place] [address]    Special Requests   Final    BOTTLES DRAWN AEROBIC AND ANAEROBIC Blood Culture  adequate volume Performed at [facility], [address], [place], [place] [address]    Culture   Final    NO GROWTH 3 DAYS Performed at [facility], [address], [place], [place] [address]    Report Status PENDING  Incomplete  Culture, blood (Routine x 2)     Status: None (Preliminary result)   Collection Time: 05/10/19  5:08 PM   Specimen: BLOOD LEFT HAND  Result Value Ref Range Status   Specimen Description   Final    BLOOD LEFT HAND Performed at [facility], [address], [place], [place] [address]    Special Requests  Final    BOTTLES DRAWN AEROBIC AND ANAEROBIC Blood Culture adequate volume Performed at [facility], [address] [place], [place] [address]    Culture   Final    NO GROWTH 3 DAYS Performed at [facility], [address] [place], [place] [address]    Report Status PENDING  Incomplete  Urine culture     Status: None   Collection Time: 05/10/19  7:01 PM   Specimen: In/Out Cath Urine  Result Value Ref Range Status   Specimen Description   Final    IN/OUT CATH URINE Performed at [facility], [address] [place], [place] [address]    Special Requests   Final    NONE Performed at [facility], [address] [place], [place] [address]    Culture   Final    NO GROWTH Performed at [facility], [address] [place], [place] [address]    Report Status 05/11/2019 FINAL  Final  Respiratory Panel by RT PCR (Flu A&B, Covid) - Nasopharyngeal Swab     Status: None   Collection Time: 05/10/19  9:05 PM   Specimen: Nasopharyngeal Swab  Result Value Ref Range Status   SARS Coronavirus 2 by RT PCR NEGATIVE NEGATIVE Final    Comment: (NOTE) SARS-CoV-2 target nucleic acids are NOT DETECTED. The SARS-CoV-2 RNA is generally detectable in upper respiratoy specimens during the acute phase of infection. The lowest concentration of  SARS-CoV-2 viral copies this assay can detect is 131 copies/mL. A negative result does not preclude SARS-Cov-2 infection and should not be used as the sole basis for treatment or other patient management decisions. A negative result may occur with  improper specimen collection/handling, submission of specimen other than nasopharyngeal swab, presence of viral mutation(s) within the areas targeted by this assay, and inadequate number of viral copies (<131 copies/mL). A negative result must be combined with clinical observations, patient history, and epidemiological information. The expected result is Negative. Fact Sheet for Patients:  https://www.moore.com/ Fact Sheet for Healthcare Providers:  https://www.young.biz/ This test is not yet ap proved or cleared by the [place] FDA and  has been authorized for detection and/or diagnosis of SARS-CoV-2 by FDA under an Emergency Use Authorization (EUA). This EUA will remain  in effect (meaning this test can be used) for the duration of the COVID-19 declaration under Section 564(b)(1) of the Act, 21 U.S.C. section 360bbb-3(b)(1), unless the authorization is terminated or revoked sooner.    Influenza A by PCR NEGATIVE NEGATIVE Final   Influenza B by PCR NEGATIVE NEGATIVE Final    Comment: (NOTE) The Xpert Xpress SARS-CoV-2/FLU/RSV assay is intended as an aid in  the diagnosis of influenza from Nasopharyngeal swab specimens and  should not be used as a sole basis for treatment. Nasal washings and  aspirates are unacceptable for Xpert Xpress SARS-CoV-2/FLU/RSV  testing. Fact Sheet for Patients: https://www.moore.com/ Fact Sheet for Healthcare Providers: https://www.young.biz/ This test is not yet approved or cleared by the [place] FDA and  has been authorized for detection and/or diagnosis of SARS-CoV-2 by  FDA under an Emergency Use Authorization (EUA).  This EUA will remain  in effect (meaning this test can be used) for the duration of the  Covid-19 declaration under Section 564(b)(1) of the Act, 21  U.S.C. section 360bbb-3(b)(1), unless the authorization is  terminated or revoked. Performed at [facility], [address] [place], [place] [address]   MRSA PCR  Screening     Status: None   Collection Time: 05/11/19  3:14 AM   Specimen: Nasal Mucosa; Nasopharyngeal  Result Value Ref Range Status   MRSA by PCR NEGATIVE NEGATIVE Final    Comment:        The GeneXpert MRSA Assay (FDA approved for NASAL specimens only), is one component of a comprehensive MRSA colonization surveillance program. It is not intended to diagnose MRSA infection nor to guide or monitor treatment for MRSA infections. Performed at [facility], [address] [place], [place] [address]     Please note: You were cared for by a hospitalist during your hospital stay. Once you are discharged, your primary care physician will handle any further medical issues. Please note that NO REFILLS for any discharge medications will be authorized once you are discharged, as it is imperative that you return to your primary care physician (or establish a relationship with a primary care physician if you do not have one) for your post hospital discharge needs so that they can reassess your need for medications and monitor your lab values.    Time coordinating discharge: 40 minutes  SIGNED:   Burnadette Pop, MD  [facility] 05/14/2019, 10:29 AM Pager [phone]  If 7PM-7AM, please contact night-coverage [email or web address] Password [email or web address]

## 2019-05-14 NOTE — Progress Notes (Signed)
Daily Progress Note   [name] Name: [name]       Date: 05/14/2019 DOB: November 15, 1927  Age: 85 y.o. MRN#: [id number] Attending Physician: Sherri Coss, MD Primary Care Physician: Sherri Ohara, MD Admit Date: 05/10/2019  Reason for Consultation/Follow-up: Establishing goals of care  Subjective: I saw and examined [name] this morning.  Daughter reports [name] had a "rough night" with very little sleep.  Discussed goal of getting home with hospice as well as medications for comfort at home.  Length of Stay: 4  Current Medications: Scheduled Meds:  . Chlorhexidine Gluconate Cloth  6 each Topical Daily  . mouth rinse  15 mL Mouth Rinse BID    Continuous Infusions:   PRN Meds: acetaminophen, albuterol, antiseptic oral rinse, atropine, glycopyrrolate **OR** glycopyrrolate **OR** glycopyrrolate, haloperidol **OR** haloperidol **OR** haloperidol lactate, HYDROmorphone (DILAUDID) injection, LORazepam **OR** LORazepam **OR** LORazepam, Melatonin, ondansetron (ZOFRAN) IV, oxyCODONE, polyvinyl alcohol  Physical Exam         General: Alert, awake, in no acute distress. Confused. Heart: Regular rate and rhythm. No murmur appreciated. Lungs: Decreased air movement in bases Abdomen: Soft, nontender, nondistended, positive bowel sounds.  Ext: No significant edema Skin: Warm and dry  Vital Signs: BP 131/69 (BP Location: Right Arm)   Pulse 70   Temp 97.7 F (36.5 C) (Oral)   Resp (!) 26   Ht 5\' 4"  (1.626 m)   Wt 63 kg   SpO2 92%   BMI 23.84 kg/m  SpO2: SpO2: 92 % O2 Device: O2 Device: Room Air O2 Flow Rate: O2 Flow Rate (L/min): 2 L/min  Intake/output summary:   Intake/Output Summary (Last 24 hours) at 05/14/2019 4098 Last data filed at 05/14/2019 0600 Gross per 24  hour  Intake 430.65 ml  Output 200 ml  Net 230.65 ml   LBM: Last BM Date: 05/11/19 Baseline Weight: Weight: 53.5 kg Most recent weight: Weight: 63 kg       Palliative Assessment/Data:    Flowsheet Rows     Most Recent Value  Intake Tab  Referral Department  Hospitalist  Unit at Time of Referral  Intermediate Care Unit  Palliative Care Primary Diagnosis  Cardiac  Date Notified  05/13/19  Palliative Care Type  New Palliative care  Reason for referral  Clarify Goals of Care, Counsel Regarding Hospice, End of  Life Care Assistance  Date of Admission  05/10/19  Date first seen by Palliative Care  05/13/19  # of days Palliative referral response time  0 Day(s)  # of days IP prior to Palliative referral  3  Clinical Assessment  Palliative Performance Scale Score  20%  Psychosocial & Spiritual Assessment  Palliative Care Outcomes  [name]/Family meeting held?  Yes  Who was at the meeting?  [name], daughter  Palliative Care Outcomes  Clarified goals of care, Counseled regarding hospice, Changed to focus on comfort, Transitioned to hospice      [name] Active Problem List   Diagnosis Date Noted  . Elevated troponin 05/11/2019  . Acute kidney injury superimposed on chronic kidney disease (HCC) 05/11/2019  . Severe sepsis (HCC) 05/10/2019  . DOE (dyspnea on exertion) 06/20/2018  . Chest pain 06/20/2018  . Pacemaker battery depletion 10/13/2017  . Sick sinus syndrome (HCC) 10/01/2017  . Chronic diastolic heart failure (HCC) 08/02/2017  . PAT (paroxysmal atrial tachycardia) (HCC) 09/08/2015  . Mixed hyperlipidemia 09/08/2015  . Pacemaker - dual chamber, [employer]- unipolar leads 1994 04/21/2013  . Tobacco abuse 10/22/2011  . Pure hyperglyceridemia 10/22/2011  . Essential hypertension, benign 10/22/2011  . CRI (chronic renal insufficiency), stage 3 (moderate) 10/22/2011    Palliative Care Assessment & Plan   [name] Profile: [name] is a 85 year old female with past  medical history of pacemaker for bradycardia who presented with issues of dizziness.  Work-up revealed concern for sepsis, AKI and elevated troponin and NSTEMI with possible Takotsubo cardiomyopathy.  Cardiology discussed with [name]'s daughter today and her daughter [name] reports that [name] has been saying that she is ready to pass and wants to be at home.  Based upon this, her daughter would like to work to transition to comfort with a goal of transitioning home with the support of hospice.  Palliative care is consulted for care coordination and symptom management.  Recommendations/Plan: - Plan for home with hospice today.  Discussed with case management and appreciate assistance.   On discharge, would recommend scripts for: - Oxycodone Concentrate 10mg /0.135ml: 5-10mg  (0.5225ml-0.5ml) sublingual every 1 hour as needed for pain or shortness of breath: Disp 30ml  - Lorazepam 2mg /ml concentrated solution: 1mg  (0.865ml) sublingual every 4 hours as needed for anxiety: Disp 30ml - Haldol 2mg /ml solution: 0.5mg  (0.5625ml) sublingual every 4 hours as needed for agitation or nausea: Disp 30ml  - Atropine ophthalmic solution 1%: 3 drops sublingual every 4 hours as needed for excess secretions: Disp 2ml  Goals of Care and Additional Recommendations:  Limitations on Scope of Treatment: Full Comfort Care  Code Status:    Code Status Orders  (From admission, onward)         Start     Ordered   05/13/19 1630  Do not attempt resuscitation (DNR)  Continuous    Question Answer Comment  In the event of cardiac or respiratory ARREST Do not call a "code blue"   In the event of cardiac or respiratory ARREST Do not perform Intubation, CPR, defibrillation or ACLS   In the event of cardiac or respiratory ARREST Use medication by any route, position, wound care, and other measures to relive pain and suffering. May use oxygen, suction and manual treatment of airway obstruction as needed for comfort.   Comments  [name] chose DNR/DNI with clear understanding of both options      05/13/19 1632        Code Status History    Date Active Date Inactive  Code Status Order ID Comments User Context   05/10/2019 2305 05/13/2019 1632 DNR [id number]  Rolan Lipa, MD ED   10/17/2017 1524 10/17/2017 1906 Full Code [id number]  Croitoru, Rachelle Hora, MD Inpatient   Advance Care Planning Activity    Advance Directive Documentation     Most Recent Value  Type of Advance Directive  Healthcare Power of Attorney, Living will  Pre-existing out of facility DNR order (yellow form or pink MOST form)  -  "MOST" Form in Place?  -       Prognosis:   < 2 weeks  Discharge Planning:  Home with Hospice  Care plan was discussed with case management  Thank you for allowing the Palliative Medicine Team to assist in the care of this [name].   Time In: 0900 Time Out: 0930 Total 30 Prolonged Time Billed No      Greater than 50%  of this time was spent counseling and coordinating care related to the above assessment and plan.  Romie Minus, MD  Please contact Palliative Medicine Team phone at [phone] for questions and concerns.

## 2019-05-14 NOTE — Progress Notes (Signed)
Hydrologist [facility])  Hospital Liaison: RN note  Notified by Transition of Care Manger, Alinda Sierras of patient/family request for [facility] services at home after discharge. Chart and patient information under reviewed by [facility] physician. Hospice eligibility confirmed.   Writer spoke with daughter, Jackelyn Poling to initiate education related to hospice philosophy, services and team approach to care.  [name] verbalized understanding of information given. Per discussion, plan is for discharge to home by [facility].    Please send signed and completed DNR form home with patient/family. Patient will need prescriptions for discharge comfort medications.     DME needs have been discussed. Patient/family requests the following DME for delivery to the home:     Hospital bed and table. [facility] equipment manager has been notified and will arrange delivery to the home. Home address has been verified and is correct in the chart. Jackelyn Poling is the family member to contact to arrange time of delivery.    [facility] aware of the above. Please notify [facility] when patient is ready to leave the unit at discharge. (Call [phone] or [phone] after 5pm.) [facility] information and contact numbers given to [facility].      Please call with any hospice related questions.     Thank you for this referral.    Farrel Gordon, RN, CCM [place] (listed on AMION under [facility]osp[facility] [place] [facility] [place]) [phone]

## 2019-05-14 NOTE — Care Management Important Message (Signed)
Important Message  Patient Details IM Letter given to Marney Doctor RN Case Manager to present to the Patient Name: [name] MRN: [id number] Date of Birth: 12-04-1927   Medicare Important Message Given:  Yes     Kerin Salen 05/14/2019, 9:52 AM

## 2019-05-14 NOTE — TOC Transition Note (Signed)
Transition of Care [facility]) - CM/SW Discharge Note   Patient Details  Name: KRIS BURD MRN: [id number] Date of Birth: [birth year]  Transition of Care [facility] CM/SW Contact:  Lynnell Catalan, RN Phone Number: 05/14/2019, 12:10 PM   Clinical Narrative:     This CM met with pt daughter Jackelyn Poling at bedside to offer choice for home hospice services. [facility] chosen and [place] manager contacted for referral. [facility] to be contacted for transport per [name] request. Yellow DNR signed and on shadow chart for dc. Comfort scripts sent to pharmacy by MD.  Marney Doctor RN,BSN

## 2019-05-15 LAB — CULTURE, BLOOD (ROUTINE X 2)
Culture: NO GROWTH
Culture: NO GROWTH
Special Requests: ADEQUATE
Special Requests: ADEQUATE

## 2019-05-18 ENCOUNTER — Telehealth: Payer: Self-pay

## 2019-05-18 NOTE — Telephone Encounter (Signed)
I called the pt. To get her scheduled for a hospital f/u she was on my [facility] list, talked to the pt. Daughter and she informed me that she passed away yesterday.

## 2019-05-18 NOTE — Telephone Encounter (Signed)
I spoke to daughter [name] and expressed my condolences.  Please send a condolence card from the office.  Thank you.

## 2019-05-19 ENCOUNTER — Telehealth: Payer: Self-pay | Admitting: Family Medicine

## 2019-05-19 NOTE — Telephone Encounter (Signed)
Sympathy card sent to family 

## 2019-06-05 IMAGING — DX DG LUMBAR SPINE COMPLETE 4+V
5 series · 5 of 5 positions shown · non-contrast
Comparison: 12/14/2014

CLINICAL DATA: Right low back pain from fall 2 weeks ago.

EXAM:
LUMBAR SPINE - COMPLETE 4+ VIEW

[dg lumbar spine complete 4 +v (1 of 5)]
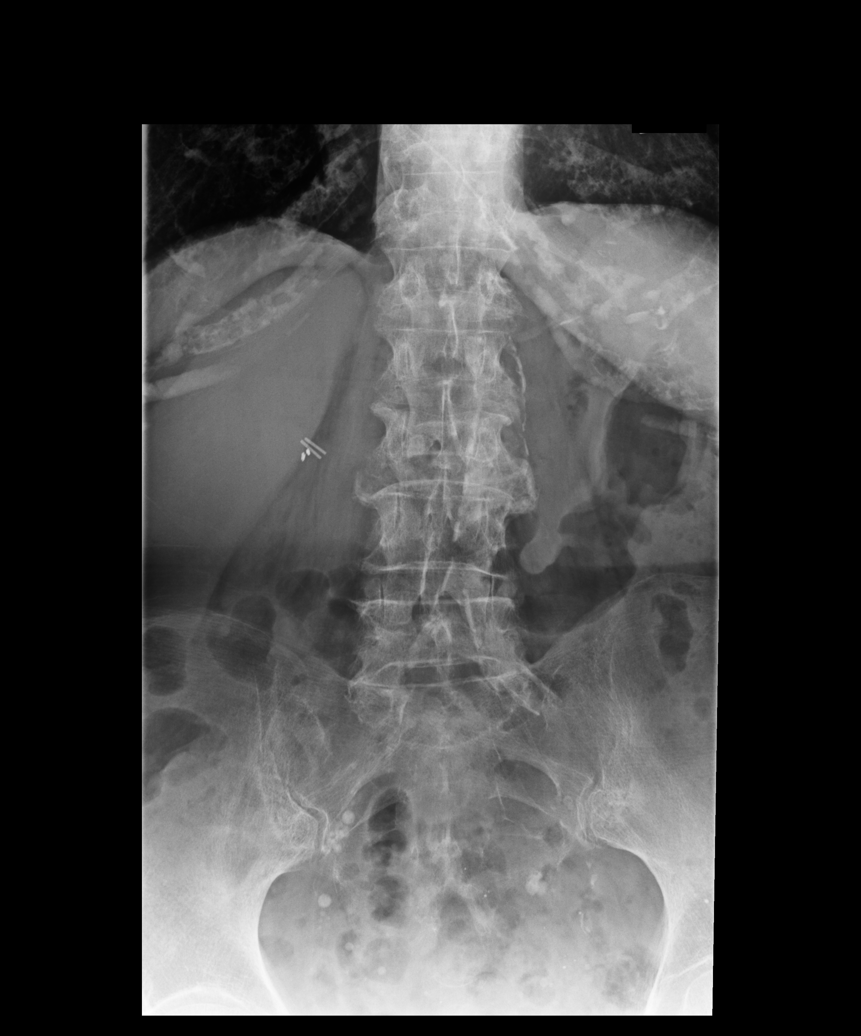

[dg lumbar spine complete 4 +v (2 of 5)]
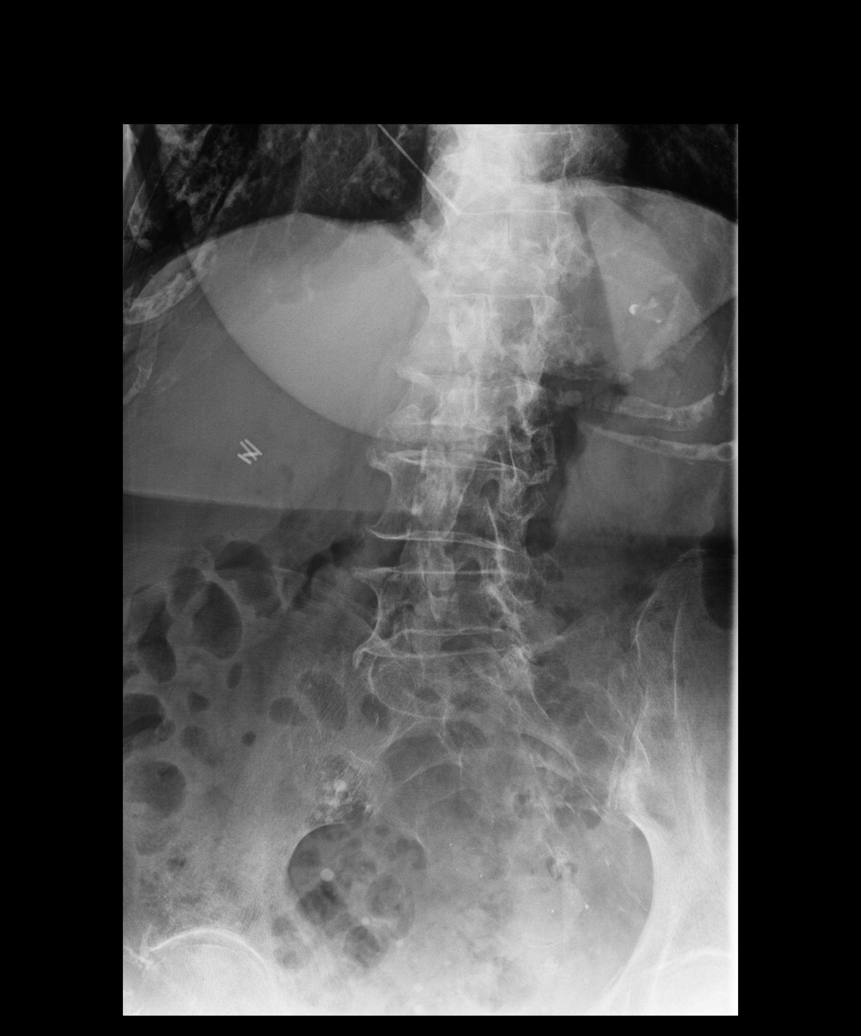

[dg lumbar spine complete 4 +v (3 of 5)]
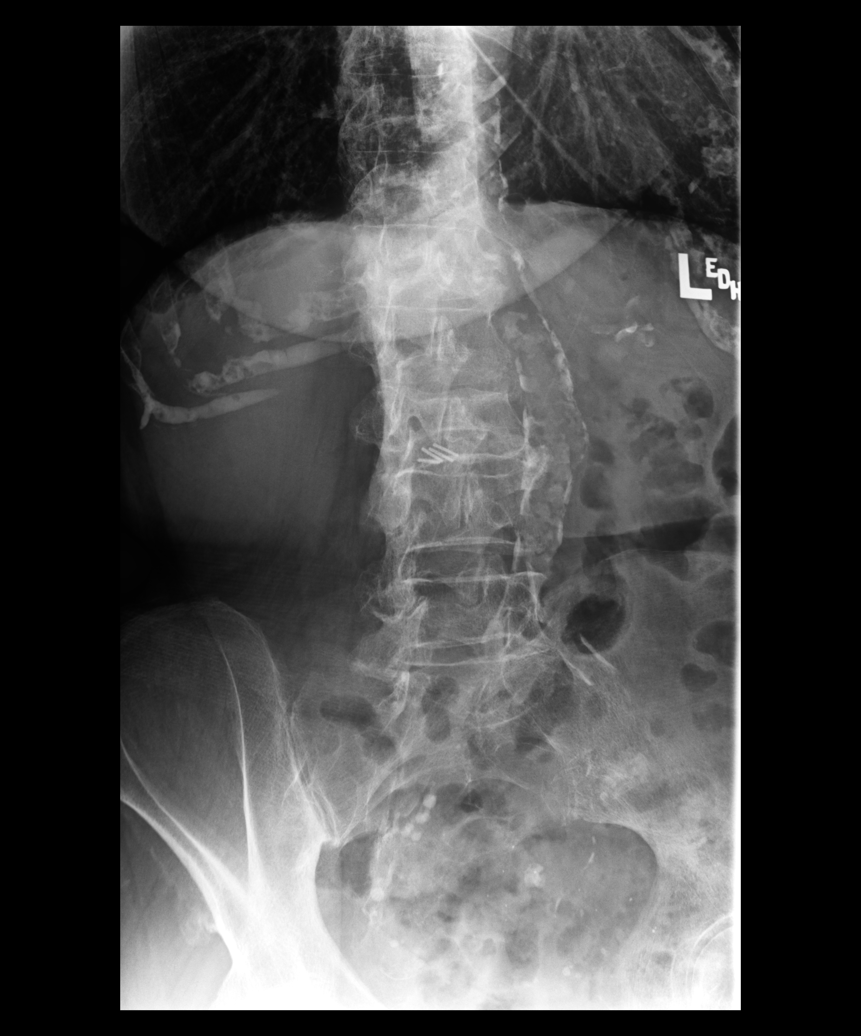

[dg lumbar spine complete 4 +v (4 of 5)]
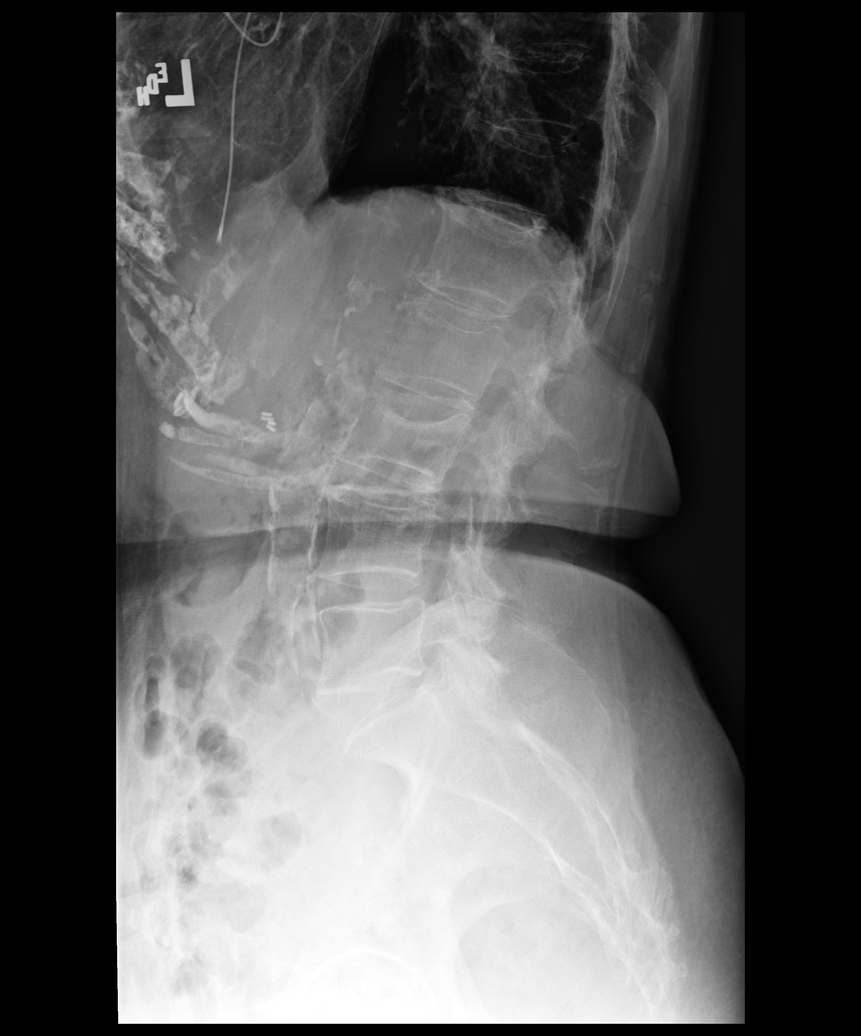

[dg lumbar spine complete 4 +v (5 of 5)]
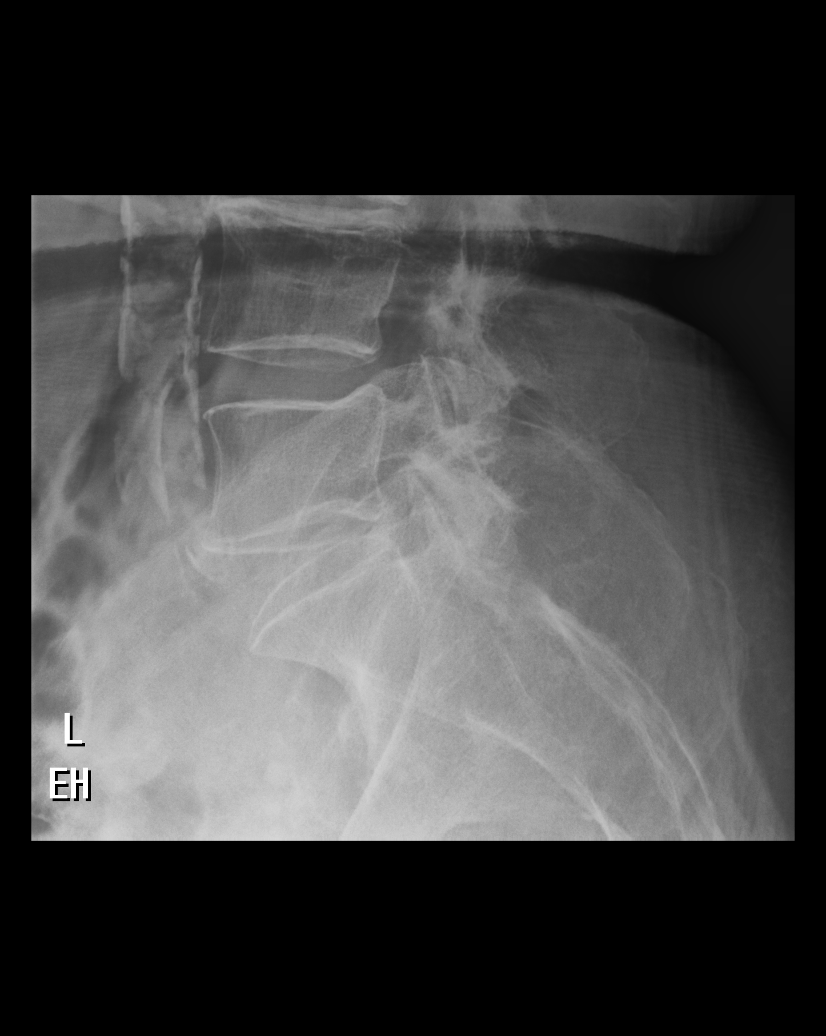

[5 of 5 positions shown; findings below may reference images not displayed]

FINDINGS: Mild compression fracture through the superior endplate of L3. This
is new since prior scratched at this is new since 6058. Normal
alignment. Degenerative disc disease and facet disease changes in
the lumbar spine. Aortic and iliac calcifications. No visible
aneurysm.
IMPRESSION: Degenerative disc and facet disease.

Mild compression fracture through the superior endplate of L3, new
since [DATE].

## 2019-06-15 DEATH — deceased

## 2020-04-20 ENCOUNTER — Ambulatory Visit: Payer: Medicare HMO | Admitting: Family Medicine
